# Patient Record
Sex: Female | Born: 1935 | Race: Black or African American | Hispanic: No | Smoking: Former smoker
Health system: Southern US, Community
[De-identification: ages and names within clinical notes are randomized; demographics above are authoritative.]

## PROBLEM LIST (undated history)

## (undated) DIAGNOSIS — C189 Malignant neoplasm of colon, unspecified: Secondary | ICD-10-CM

## (undated) DIAGNOSIS — E039 Hypothyroidism, unspecified: Secondary | ICD-10-CM

## (undated) DIAGNOSIS — I10 Essential (primary) hypertension: Secondary | ICD-10-CM

## (undated) DIAGNOSIS — E119 Type 2 diabetes mellitus without complications: Secondary | ICD-10-CM

## (undated) DIAGNOSIS — H269 Unspecified cataract: Secondary | ICD-10-CM

## (undated) DIAGNOSIS — K5790 Diverticulosis of intestine, part unspecified, without perforation or abscess without bleeding: Secondary | ICD-10-CM

## (undated) DIAGNOSIS — M1990 Unspecified osteoarthritis, unspecified site: Secondary | ICD-10-CM

## (undated) DIAGNOSIS — D649 Anemia, unspecified: Secondary | ICD-10-CM

## (undated) DIAGNOSIS — G43909 Migraine, unspecified, not intractable, without status migrainosus: Secondary | ICD-10-CM

## (undated) DIAGNOSIS — E785 Hyperlipidemia, unspecified: Secondary | ICD-10-CM

## (undated) DIAGNOSIS — K219 Gastro-esophageal reflux disease without esophagitis: Secondary | ICD-10-CM

## (undated) HISTORY — DX: Hyperlipidemia, unspecified: E78.5

## (undated) HISTORY — PX: DILATION AND CURETTAGE OF UTERUS: SHX78

## (undated) HISTORY — PX: OTHER SURGICAL HISTORY: SHX169

## (undated) HISTORY — DX: Malignant neoplasm of colon, unspecified: C18.9

## (undated) HISTORY — PX: HEMORRHOID SURGERY: SHX153

## (undated) HISTORY — DX: Unspecified osteoarthritis, unspecified site: M19.90

## (undated) HISTORY — DX: Hypothyroidism, unspecified: E03.9

## (undated) HISTORY — DX: Migraine, unspecified, not intractable, without status migrainosus: G43.909

## (undated) HISTORY — DX: Diverticulosis of intestine, part unspecified, without perforation or abscess without bleeding: K57.90

## (undated) HISTORY — DX: Essential (primary) hypertension: I10

## (undated) HISTORY — DX: Unspecified cataract: H26.9

---

## 1992-11-24 DIAGNOSIS — C189 Malignant neoplasm of colon, unspecified: Secondary | ICD-10-CM

## 1992-11-24 HISTORY — PX: COLON SURGERY: SHX602

## 1992-11-24 HISTORY — DX: Malignant neoplasm of colon, unspecified: C18.9

## 2001-07-01 ENCOUNTER — Other Ambulatory Visit: Admission: RE | Admit: 2001-07-01 | Discharge: 2001-07-01 | Payer: Self-pay | Admitting: Family Medicine

## 2001-08-04 ENCOUNTER — Ambulatory Visit (HOSPITAL_COMMUNITY): Admission: RE | Admit: 2001-08-04 | Discharge: 2001-08-05 | Payer: Self-pay | Admitting: Family Medicine

## 2001-08-05 ENCOUNTER — Encounter: Payer: Self-pay | Admitting: Family Medicine

## 2001-08-12 ENCOUNTER — Encounter: Payer: Self-pay | Admitting: Family Medicine

## 2001-08-12 ENCOUNTER — Ambulatory Visit (HOSPITAL_COMMUNITY): Admission: RE | Admit: 2001-08-12 | Discharge: 2001-08-12 | Payer: Self-pay | Admitting: Family Medicine

## 2001-09-19 ENCOUNTER — Emergency Department (HOSPITAL_COMMUNITY): Admission: EM | Admit: 2001-09-19 | Discharge: 2001-09-19 | Payer: Self-pay | Admitting: Emergency Medicine

## 2001-12-08 ENCOUNTER — Ambulatory Visit (HOSPITAL_COMMUNITY): Admission: RE | Admit: 2001-12-08 | Discharge: 2001-12-08 | Payer: Self-pay | Admitting: Family Medicine

## 2001-12-08 ENCOUNTER — Encounter: Payer: Self-pay | Admitting: Family Medicine

## 2002-07-12 ENCOUNTER — Encounter: Payer: Self-pay | Admitting: Family Medicine

## 2002-07-12 ENCOUNTER — Ambulatory Visit (HOSPITAL_COMMUNITY): Admission: RE | Admit: 2002-07-12 | Discharge: 2002-07-12 | Payer: Self-pay | Admitting: Family Medicine

## 2002-08-09 ENCOUNTER — Encounter: Admission: RE | Admit: 2002-08-09 | Discharge: 2002-11-07 | Payer: Self-pay | Admitting: Family Medicine

## 2002-12-08 ENCOUNTER — Encounter: Payer: Self-pay | Admitting: Family Medicine

## 2002-12-08 ENCOUNTER — Ambulatory Visit (HOSPITAL_COMMUNITY): Admission: RE | Admit: 2002-12-08 | Discharge: 2002-12-08 | Payer: Self-pay | Admitting: Family Medicine

## 2003-12-15 ENCOUNTER — Ambulatory Visit (HOSPITAL_COMMUNITY): Admission: RE | Admit: 2003-12-15 | Discharge: 2003-12-15 | Payer: Self-pay | Admitting: Internal Medicine

## 2004-02-28 ENCOUNTER — Ambulatory Visit (HOSPITAL_COMMUNITY): Admission: RE | Admit: 2004-02-28 | Discharge: 2004-02-28 | Payer: Self-pay | Admitting: Internal Medicine

## 2004-04-30 ENCOUNTER — Emergency Department (HOSPITAL_COMMUNITY): Admission: EM | Admit: 2004-04-30 | Discharge: 2004-04-30 | Payer: Self-pay | Admitting: Emergency Medicine

## 2004-12-16 ENCOUNTER — Ambulatory Visit (HOSPITAL_COMMUNITY): Admission: RE | Admit: 2004-12-16 | Discharge: 2004-12-16 | Payer: Self-pay | Admitting: Internal Medicine

## 2005-11-25 ENCOUNTER — Emergency Department (HOSPITAL_COMMUNITY): Admission: EM | Admit: 2005-11-25 | Discharge: 2005-11-25 | Payer: Self-pay | Admitting: Emergency Medicine

## 2005-12-18 ENCOUNTER — Ambulatory Visit (HOSPITAL_COMMUNITY): Admission: RE | Admit: 2005-12-18 | Discharge: 2005-12-18 | Payer: Self-pay | Admitting: Family Medicine

## 2006-12-21 ENCOUNTER — Ambulatory Visit (HOSPITAL_COMMUNITY): Admission: RE | Admit: 2006-12-21 | Discharge: 2006-12-21 | Payer: Self-pay | Admitting: Family Medicine

## 2007-01-06 ENCOUNTER — Ambulatory Visit (HOSPITAL_COMMUNITY): Admission: RE | Admit: 2007-01-06 | Discharge: 2007-01-06 | Payer: Self-pay | Admitting: Orthopaedic Surgery

## 2007-03-08 ENCOUNTER — Ambulatory Visit (HOSPITAL_COMMUNITY): Admission: RE | Admit: 2007-03-08 | Discharge: 2007-03-08 | Payer: Self-pay | Admitting: Orthopaedic Surgery

## 2007-12-24 ENCOUNTER — Ambulatory Visit (HOSPITAL_COMMUNITY): Admission: RE | Admit: 2007-12-24 | Discharge: 2007-12-24 | Payer: Self-pay | Admitting: Family Medicine

## 2008-02-17 ENCOUNTER — Ambulatory Visit (HOSPITAL_COMMUNITY): Admission: RE | Admit: 2008-02-17 | Discharge: 2008-02-17 | Payer: Self-pay | Admitting: Orthopaedic Surgery

## 2008-09-18 ENCOUNTER — Emergency Department (HOSPITAL_COMMUNITY): Admission: EM | Admit: 2008-09-18 | Discharge: 2008-09-18 | Payer: Self-pay | Admitting: Emergency Medicine

## 2008-12-11 ENCOUNTER — Ambulatory Visit (HOSPITAL_COMMUNITY): Admission: RE | Admit: 2008-12-11 | Discharge: 2008-12-11 | Payer: Self-pay | Admitting: Family Medicine

## 2008-12-11 ENCOUNTER — Encounter: Payer: Self-pay | Admitting: Orthopedic Surgery

## 2009-01-01 ENCOUNTER — Ambulatory Visit (HOSPITAL_COMMUNITY): Admission: RE | Admit: 2009-01-01 | Discharge: 2009-01-01 | Payer: Self-pay | Admitting: Family Medicine

## 2009-03-01 ENCOUNTER — Encounter (INDEPENDENT_AMBULATORY_CARE_PROVIDER_SITE_OTHER): Payer: Self-pay | Admitting: *Deleted

## 2009-03-01 ENCOUNTER — Ambulatory Visit: Payer: Self-pay | Admitting: Orthopedic Surgery

## 2009-03-01 DIAGNOSIS — IMO0002 Reserved for concepts with insufficient information to code with codable children: Secondary | ICD-10-CM | POA: Insufficient documentation

## 2009-03-01 DIAGNOSIS — M1710 Unilateral primary osteoarthritis, unspecified knee: Secondary | ICD-10-CM

## 2009-03-01 DIAGNOSIS — M25569 Pain in unspecified knee: Secondary | ICD-10-CM | POA: Insufficient documentation

## 2009-03-07 ENCOUNTER — Encounter: Payer: Self-pay | Admitting: Orthopedic Surgery

## 2009-04-26 DIAGNOSIS — Z862 Personal history of diseases of the blood and blood-forming organs and certain disorders involving the immune mechanism: Secondary | ICD-10-CM | POA: Insufficient documentation

## 2009-04-26 DIAGNOSIS — M1990 Unspecified osteoarthritis, unspecified site: Secondary | ICD-10-CM | POA: Insufficient documentation

## 2009-04-26 DIAGNOSIS — G43909 Migraine, unspecified, not intractable, without status migrainosus: Secondary | ICD-10-CM | POA: Insufficient documentation

## 2009-04-26 DIAGNOSIS — I10 Essential (primary) hypertension: Secondary | ICD-10-CM | POA: Insufficient documentation

## 2009-04-26 DIAGNOSIS — C189 Malignant neoplasm of colon, unspecified: Secondary | ICD-10-CM | POA: Insufficient documentation

## 2009-04-27 ENCOUNTER — Ambulatory Visit: Payer: Self-pay | Admitting: Internal Medicine

## 2009-05-04 ENCOUNTER — Ambulatory Visit: Payer: Self-pay | Admitting: Internal Medicine

## 2009-05-04 ENCOUNTER — Encounter: Payer: Self-pay | Admitting: Internal Medicine

## 2009-05-04 ENCOUNTER — Ambulatory Visit (HOSPITAL_COMMUNITY): Admission: RE | Admit: 2009-05-04 | Discharge: 2009-05-04 | Payer: Self-pay | Admitting: Internal Medicine

## 2009-05-04 HISTORY — PX: COLONOSCOPY W/ BIOPSIES: SHX1374

## 2009-05-13 ENCOUNTER — Encounter: Payer: Self-pay | Admitting: Internal Medicine

## 2009-09-13 ENCOUNTER — Ambulatory Visit: Payer: Self-pay | Admitting: Orthopedic Surgery

## 2010-01-03 ENCOUNTER — Ambulatory Visit (HOSPITAL_COMMUNITY): Admission: RE | Admit: 2010-01-03 | Discharge: 2010-01-03 | Payer: Self-pay | Admitting: Family Medicine

## 2010-10-20 ENCOUNTER — Inpatient Hospital Stay (HOSPITAL_COMMUNITY)
Admission: EM | Admit: 2010-10-20 | Discharge: 2010-10-22 | Payer: Self-pay | Source: Home / Self Care | Admitting: Internal Medicine

## 2010-12-13 ENCOUNTER — Other Ambulatory Visit (HOSPITAL_COMMUNITY): Payer: Self-pay | Admitting: Family Medicine

## 2010-12-13 DIAGNOSIS — Z1239 Encounter for other screening for malignant neoplasm of breast: Secondary | ICD-10-CM

## 2010-12-24 NOTE — Assessment & Plan Note (Signed)
Summary: HX OF HEMICOLECTOMY,CONSULT FOR COLONOSCOPY/AMS   Visit Type:  New patient Primary Care Provider:  Oval Linsey, MD  Chief Complaint:  needs 5 year tcs and no problems now.  History of Present Illness: 76 y/o AA female presents today for surveillance TCS.  She has h/o Stage BII colon cancer s/p R hemicolectomy in 1994.  Her last TCS was by Dr. Karilyn Cota in 4/05.  She had patent ileocolic anastomosis, single anal papilla.  Patient is doing well.  Denies GI symptoms other than occasional constipation for which she takes colace as needed.  She has rare brbpr if she passes a hard stool.  Current Medications (verified): 1)  Avalide 300-12.5 Mg Tabs (Irbesartan-Hydrochlorothiazide) .... Once Daily 2)  Lipitor 40 Mg Tabs (Atorvastatin Calcium) .... Once Daily 3)  Evista 60 Mg Tabs (Raloxifene Hcl) .... Once Daily 4)  Multivitamins  Tabs (Multiple Vitamin) .... Once Daily 5)  Omeprazole 20 Mg Cpdr (Omeprazole) .... Once Daily 6)  Sumatriptan Succinate 100 Mg Tabs (Sumatriptan Succinate) .... As Needed 7)  Naproxen 500 Mg Tabs (Naproxen) .... Once Daily 8)  Verapamil Hcl Cr 240 Mg Cr-Tabs (Verapamil Hcl) .... Once Daily 9)  Synthroid 50 Mcg Tabs (Levothyroxine Sodium) .... Once Daily  Allergies (verified): No Known Drug Allergies  Past History:  Past Medical History: migraines htn arthritis hypothyroidism osteoporosis hyperlipidemia  Past Surgical History: D and C Hemorrhoidectomy right knee arthroscopy  Right hemicolectomy  Family History: Father:deceased- 48 heart failure  Mother: deceased- 25 pneumonia, leukemia Siblings: 3 brothers, 5 sisters No FH of Colon Cancer:  Social History: Patient is divorced.  Children: 5 Quit tob 1994. Alcohol Use - no  Review of Systems General:  Denies fever, chills, and weight loss. Eyes:  Denies vision loss. ENT:  Denies nasal congestion, sore throat, hoarseness, and difficulty swallowing. CV:  Denies chest pains, angina,  palpitations, dyspnea on exertion, and peripheral edema. Resp:  Denies dyspnea at rest, dyspnea with exercise, and cough. GI:  Denies difficulty swallowing, nausea, indigestion/heartburn, vomiting, abdominal pain, diarrhea, constipation, and black BMs. GU:  Denies urinary burning and blood in urine. MS:  Complains of joint pain / LOM. Derm:  Denies rash. Neuro:  Denies memory loss and confusion. Psych:  Denies depression and anxiety. Endo:  Denies unusual weight change. Heme:  Denies bruising and bleeding. Allergy:  Denies hives.  Vital Signs:  Patient profile:   76 year old female Height:      65 inches Weight:      139 pounds BMI:     23.21 Temp:     98.0 degrees F oral Pulse rate:   76 / minute BP sitting:   128 / 80  (left arm) Cuff size:   regular  Vitals Entered By: Hendricks Limes LPN (April 28, 4539 11:18 AM)  Physical Exam  General:  Well developed, well nourished, no acute distress. Head:  Normocephalic and atraumatic. Eyes:  Conjunctivae pink, no scleral icterus.  Mouth:  Oropharyngeal mucosa moist, pink.  No lesions, erythema or exudate.    Neck:  Supple; no masses or thyromegaly. Lungs:  Clear throughout to auscultation. Heart:  Regular rate and rhythm; no murmurs, rubs,  or bruits. Abdomen:  Bowel sounds normal.  Abdomen is soft, nontender, nondistended.  No rebound or guarding.  No hepatosplenomegaly, masses or hernias.  No abdominal bruits.  Extremities:  No clubbing, cyanosis, edema or deformities noted. Neurologic:  Alert and  oriented x4;  grossly normal neurologically. Skin:  Intact without significant lesions or  rashes. Cervical Nodes:  No significant cervical adenopathy. Psych:  Alert and cooperative. Normal mood and affect.  Impression & Recommendations:  Problem # 1:  Hx of CARCINOMA, COLON (ICD-153.9)  Due for 5 year f/u surveillance TCS.  She has occasional constipation.  Recommend continue stool softners as needed.  Colonoscopy to be performed in  near future.  Risks, alternatives, and benefits including but not limited to the risk of reaction to medication, bleeding, infection, and perforation were addressed.  Patient voiced understanding and provided verbal consent.   Orders: New Patient Level III [phone])

## 2010-12-24 NOTE — Letter (Signed)
Summary: History form  History form   Imported By: Jacklynn Ganong 03/07/2009 11:19:15  _____________________________________________________________________  External Attachment:    Type:   Image     Comment:   External Document

## 2010-12-24 NOTE — Letter (Signed)
Summary: Patient Notice, Colon Biopsy Results  [facility]  [address]   [place], [place] [address]   Phone: [phone]  Fax: [phone]       May 13, 2009   [place] [address] [place], [place]  [address] 1935-01-01    Dear Ms. Myler,  I am pleased to inform you that the biopsies taken during your recent colonoscopy did not show any evidence of cancer upon pathologic examination, only mild inflammation..  You should have a repeat colonoscopy examination  in 5 years.  Please call us if you are having persistent problems or have questions about your condition that have not been fully answered at this time.  Sincerely,    R. Roetta Sessions MD  [facility] Ph: [phone]    Fax: [phone]   Appended Document: Patient Notice, Colon Biopsy Results letter mailed to pt  Appended Document: Patient Notice, Colon Biopsy Results Reminder noted in computer for 5 yrs.  AS

## 2010-12-24 NOTE — Letter (Signed)
Summary: TCS ORDER  TCS ORDER   Imported By: Ave Filter 04/27/2009 12:46:00  _____________________________________________________________________  External Attachment:    Type:   Image     Comment:   External Document

## 2010-12-24 NOTE — Assessment & Plan Note (Signed)
Summary: 6 M RE-CK/XRAY RT KNEE/EVERCARE,MEDICAID/CAF   Visit Type:  Follow-up Primary Provider:  Oval Linsey, MD   History of Present Illness: I saw Glenda Nolan in the office today for a followup visit.  She is a 76 years old woman with the complaint of:  BILATERAL KNEES.  DX:  OA knees  Treatment: She is status post arthroscopy RIGHT knee last year by Dr. Teola Bradley.    MEDS: Naproxen  Complaints:  NO PROBLEMS.  Today, scheduled for:  RECHECK AND XRAYS.  physical examination The patient is well developed and nourished, with normal grooming and hygiene. The body habitus is   The pulses and perfusion were normal with normal color, temperature  and no swelling  The coordination and sensation were normal   AAO X 3  Left knee 120 degrees flexion, 20 valgus.  Right knee 130 flexion, 15 valgus.  Current Medications (verified): 1)  Avalide 300-12.5 Mg Tabs (Irbesartan-Hydrochlorothiazide) .... Once Daily 2)  Lipitor 40 Mg Tabs (Atorvastatin Calcium) .... Once Daily 3)  Evista 60 Mg Tabs (Raloxifene Hcl) .... Once Daily 4)  Multivitamins  Tabs (Multiple Vitamin) .... Once Daily 5)  Omeprazole 20 Mg Cpdr (Omeprazole) .... Once Daily 6)  Sumatriptan Succinate 100 Mg Tabs (Sumatriptan Succinate) .... As Needed 7)  Naproxen 500 Mg Tabs (Naproxen) .... Once Daily 8)  Verapamil Hcl Cr 240 Mg Cr-Tabs (Verapamil Hcl) .... Once Daily 9)  Synthroid 50 Mcg Tabs (Levothyroxine Sodium) .... Once Daily  Allergies (verified): No Known Drug Allergies  Past History:  Past medical, surgical, family and social histories (including risk factors) reviewed, and no changes noted (except as noted below).  Past Medical History: Reviewed history from 04/27/2009 and no changes required. migraines htn arthritis hypothyroidism osteoporosis hyperlipidemia  Past Surgical History: Reviewed history from 04/27/2009 and no changes required. D and C Hemorrhoidectomy right knee  arthroscopy  Right hemicolectomy  Family History: Reviewed history from 04/27/2009 and no changes required. Father:deceased- 58 heart failure  Mother: deceased- 72 pneumonia, leukemia Siblings: 3 brothers, 5 sisters No FH of Colon Cancer:  Social History: Reviewed history from 04/27/2009 and no changes required. Patient is divorced.  Children: 5 Quit tob 1994. Alcohol Use - no  Review of Systems MS:  she does have some difficulty at times getting out of a chair. She is not having any significant pain in the knee.  Go to sleep.   Impression & Recommendations:  Problem # 1:  KNEE, ARTHRITIS, DEGEN./OSTEO (ICD-715.96) Assessment Unchanged  bilateral knee x-rays, AP, lateral, and sunrise.  There is valgus osteoarthritis in both knees, LEFT greater than RIGHT.  Impression moderate to severe bilateral valgus osteoarthritis.  Assessment she is doing well at this time with no major bone loss on x-ray. No major pain. Recommend followup six-month x-ray. If she has increased pain that is unbearable or intolerable and I would recommend knee replacement.   Her updated medication list for this problem includes:    Naproxen 500 Mg Tabs (Naproxen) ..... Once daily  Orders: Est. Patient Level III ([address]) Knee x-ray,  3 views (60454)

## 2010-12-24 NOTE — Letter (Signed)
Summary: Mediciation list  Mediciation list   Imported By: Jacklynn Ganong 03/07/2009 14:05:35  _____________________________________________________________________  External Attachment:    Type:   Image     Comment:   External Document

## 2010-12-24 NOTE — Letter (Signed)
Summary: Recall, Screening Colonoscopy Only  [facility]  [address]   [place], [place] [address]   Phone: [phone]  Fax: [phone]    March 01, 2009  [place] [address] [place], [place]  [address] 1935-11-24   Dear Ms. Fader,   Our records indicate it is time to schedule your colonoscopy.  However, our office has been unable to contact you by phone.   Please call our office at [phone] and ask for the nurse.   Thank you,    Hendricks Limes, LPN Cloria Spring, LPN  [facility] Ph: [phone]   Fax: [phone]

## 2010-12-24 NOTE — Assessment & Plan Note (Signed)
Summary: LEFT KNEE PAIN/XR AP JAN 2010/SEC HIR/MEDICAID/DONDIEGO/BSF   Vital Signs:  Patient profile:   76 year old female Weight:      139 pounds Pulse (ortho):   68 / minute Resp:     16 per minute  Vitals Entered By: Fuller Canada MD (March 01, 2009 10:00 AM)  History of Present Illness: I saw Glenda Nolan in the office today for an initial visit.  She is a 76 years old woman with the complaint of:  left knee pain, referral DR. DonDiego.  She is status post arthroscopy RIGHT knee last year by Dr. Teola Bradley.  She complains of LEFT knee pain and swelling for the last 2-3 months without any injury.  She has stiffness and problems getting out of a chair.  She denies catching or locking.  Her pain is diffuse.  She wears a knee sleeve and she take some naproxen 500 mg twice a day as well as topical BenGay.  She had an x-ray in January of 2010 on the 18th.  Pain and swelling for a few months, no injury.    Preventive Screening-Counseling & Management     Alcohol drinks/day: 0     Smoking Status: never     Caffeine use/day: 2  Allergies (verified): No Known Drug Allergies  Past History:  Family History:    FH of Cancer:     Family History of Diabetes    Family History Coronary Heart Disease female < 67    Family History of Arthritis    Hx, family, asthma    Hx, family, kidney disease NEC     (03/01/2009)  Social History:    Patient is divorced.     unemployed (03/01/2009)  Risk Factors:    Alcohol Use: 0 (03/01/2009)    >5 drinks/d w/in last 3 months: N/A    Caffeine Use: 2 (03/01/2009)    Diet: N/A    Exercise: N/A  Risk Factors:    Smoking Status: never (03/01/2009)    Packs/Day: N/A    Cigars/wk: N/A    Pipe Use/wk: N/A    Cans of tobacco/wk: N/A    Passive Smoke Exposure: N/A  Past Medical History:    migraines    htn    arthritis    thyroid    osteoporosis    constipation    cholesterol  Past Surgical History:    N and C  Hemorrhoidectomy    colon cancer    right knee arthroscopy  Family History:    FH of Cancer:     Family History of Diabetes    Family History Coronary Heart Disease female < 59    Family History of Arthritis    Hx, family, asthma    Hx, family, kidney disease NEC  Social History:    Patient is divorced.     unemployed    Alcohol drinks/day:  0    Caffeine use/day:  2    Smoking Status:  never  Review of Systems General:  Complains of weight loss, weight gain, fever, chills, and fatigue. Cardiac :  Complains of poor circulation; denies chest pain, angina, heart attack, heart failure, blood clots, and phlebitis. Resp:  Complains of cough; denies short of breath, difficulty breathing, COPD, and pneumonia. GI:  Complains of nausea, vomiting, diarrhea, constipation, and difficulty swallowing; denies ulcers, GERD, and reflux. GU:  Complains of testicular cancer; denies kidney failure, kidney transplant, kidney stones, burning, poor stream, blood in urine, and . Neuro:  Complains of headache, dizziness, migraines, numbness, weakness, and unsteady walking; denies tremor. MS:  Complains of rheumatoid arthritis, joint swelling, and osteoporosis; denies joint pain, gout, bone cancer, and . Endo:  Complains of thyroid disease; denies goiter and diabetes. Psych:  Complains of mood swings; denies depression, anxiety, panic attack, bipolar, and schizophrenia. Derm:  Complains of itching; denies eczema and cancer. EENT:  Complains of cataracts and ears ringing; denies poor vision, glaucoma, poor hearing, vertigo, sinusitis, hoarseness, toothaches, and bleeding gums. Immunology:  Complains of sinus problems and allergic to bee stings; denies seasonal allergies.  The review of systems is negative for Lymphatic.  Physical Exam  Msk:   The general apperance was normal.  Pulses:  pulses normal in all 4 extremities Extremities:  LEFT knee she is maintained in excellent flexion in her LEFT knee.  She  does have some joint line symptoms.  She has good muscle strength muscle tone and all ligaments are stable.  Alignment seems to be reasonable.   Neurologic:  no focal deficits, CN II-XII grossly intact with normal reflexes, coordination, muscle strength and tone Skin:  intact without lesions or rashes Inguinal Nodes:  no significant adenopathy Psych:  alert and cooperative; normal mood and affect; normal attention span and concentration   Impression & Recommendations:  Problem # 1:  KNEE PAIN (ICD-719.46)  Orders: New Patient Level III (47829)  Problem # 2:  KNEE, ARTHRITIS, DEGEN./OSTEO (FAO-130.86)  Assessment:  At this point she is stable we can follow her in 6 months for repeat x-ray of both knees I think she will come to have total knee replacement.  x-rays show arthritis of the knee.    Orders: New Patient Level III (57846)  Patient Instructions: 1)  f/u 6 months xrays of both knees

## 2011-01-06 ENCOUNTER — Ambulatory Visit (HOSPITAL_COMMUNITY)
Admission: RE | Admit: 2011-01-06 | Discharge: 2011-01-06 | Disposition: A | Discharge status: Home / Self Care | Payer: PRIVATE HEALTH INSURANCE | Source: Ambulatory Visit | Attending: Family Medicine | Admitting: Family Medicine

## 2011-01-06 DIAGNOSIS — Z1239 Encounter for other screening for malignant neoplasm of breast: Secondary | ICD-10-CM

## 2011-01-06 DIAGNOSIS — Z1231 Encounter for screening mammogram for malignant neoplasm of breast: Secondary | ICD-10-CM | POA: Insufficient documentation

## 2011-02-04 LAB — BASIC METABOLIC PANEL
BUN: 7 mg/dL (ref 6–23)
CO2: 24 mEq/L (ref 19–32)
Calcium: 9.4 mg/dL (ref 8.4–10.5)
Chloride: 96 mEq/L (ref 96–112)
Creatinine, Ser: 1.08 mg/dL (ref 0.4–1.2)
GFR calc Af Amer: >60 mL/min (ref >60)
GFR calc non Af Amer: 50 mL/min — ABNORMAL LOW (ref >60)
Glucose, Bld: 130 mg/dL — ABNORMAL HIGH (ref 70–99)
Potassium: 3.2 mEq/L — ABNORMAL LOW (ref 3.5–5.1)
Sodium: 133 mEq/L — ABNORMAL LOW (ref 135–145)

## 2011-02-04 LAB — DIFFERENTIAL
Basophils Absolute: 0 10*3/uL (ref 0.0–0.1)
Basophils Relative: 0 % (ref 0–1)
Eosinophils Absolute: 0.1 10*3/uL (ref 0.0–0.7)
Eosinophils Relative: 1 % (ref 0–5)
Lymphocytes Relative: 13 % (ref 12–46)
Lymphs Abs: 1.6 10*3/uL (ref 0.7–4.0)
Monocytes Absolute: 0.8 10*3/uL (ref 0.1–1.0)
Monocytes Relative: 6 % (ref 3–12)
Neutro Abs: 10.3 10*3/uL — ABNORMAL HIGH (ref 1.7–7.7)
Neutrophils Relative %: 80 % — ABNORMAL HIGH (ref 43–77)

## 2011-02-04 LAB — CBC
HCT: 40.4 % (ref 36.0–46.0)
Hemoglobin: 14.4 g/dL (ref 12.0–15.0)
MCH: 30.3 pg (ref 26.0–34.0)
MCHC: 35.6 g/dL (ref 30.0–36.0)
MCV: 85.1 fL (ref 78.0–100.0)
Platelets: 293 10*3/uL (ref 150–400)
RBC: 4.75 MIL/uL (ref 3.87–5.11)
RDW: 13.1 % (ref 11.5–15.5)
WBC: 12.8 10*3/uL — ABNORMAL HIGH (ref 4.0–10.5)

## 2011-02-04 LAB — URINALYSIS, ROUTINE W REFLEX MICROSCOPIC
Bilirubin Urine: NEGATIVE
Glucose, UA: NEGATIVE mg/dL
Hgb urine dipstick: NEGATIVE
Ketones, ur: NEGATIVE mg/dL
Nitrite: NEGATIVE
Protein, ur: NEGATIVE mg/dL
Specific Gravity, Urine: <1.005 — ABNORMAL LOW (ref 1.005–1.030)
Urobilinogen, UA: 0.2 mg/dL (ref 0.0–1.0)
pH: 6.5 (ref 5.0–8.0)

## 2011-02-04 LAB — POCT CARDIAC MARKERS
CKMB, poc: 1.1 ng/mL (ref 1.0–8.0)
CKMB, poc: 1.2 ng/mL (ref 1.0–8.0)
Myoglobin, poc: 137 ng/mL (ref 12–200)
Myoglobin, poc: 99.9 ng/mL (ref 12–200)
Troponin i, poc: <0.05 ng/mL (ref 0.00–0.09)
Troponin i, poc: <0.05 ng/mL (ref 0.00–0.09)

## 2011-02-04 LAB — CARDIAC PANEL(CRET KIN+CKTOT+MB+TROPI)
CK, MB: 1 ng/mL (ref 0.3–4.0)
CK, MB: 1.3 ng/mL (ref 0.3–4.0)
CK, MB: 2 ng/mL (ref 0.3–4.0)
Relative Index: INVALID (ref 0.0–2.5)
Relative Index: INVALID (ref 0.0–2.5)
Relative Index: INVALID (ref 0.0–2.5)
Total CK: 66 U/L (ref 7–177)
Total CK: 75 U/L (ref 7–177)
Total CK: 77 U/L (ref 7–177)
Troponin I: 0.01 ng/mL (ref 0.00–0.06)
Troponin I: 0.01 ng/mL (ref 0.00–0.06)
Troponin I: 0.01 ng/mL (ref 0.00–0.06)

## 2011-02-04 LAB — URINE CULTURE
Colony Count: NO GROWTH
Culture  Setup Time: 201111272046
Culture: NO GROWTH

## 2011-04-08 NOTE — H&P (Signed)
NAME:  Glenda Nolan, Glenda Nolan                ACCOUNT NO.:  [id number]   MEDICAL RECORD NO.:  [id number]          PATIENT TYPE:  AMB   LOCATION:  DAY                           FACILITY:  [facility]   PHYSICIAN:  J. Darreld Mclean, M.D. DATE OF BIRTH:  1935-08-13   DATE OF ADMISSION:  DATE OF DISCHARGE:  LH                              HISTORY & PHYSICAL   CHIEF COMPLAINT:  Torn cartilage.   The patient was seen by me this past week for the first time in over a  year.  She was 76 years old, now complaining of pain and tenderness in  her right knee.  Last year I saw her and she had a tear of the right  knee medial meniscus.  MRI showed a new tear of the medial meniscus and  marked degenerative changes.  I did right knee arthroscopy on her in  1998.  She has had pain and tenderness in the knee and I recommended  surgery, but she developed medical problems and surgery has been  delayed.   She has now seen Dr. Janna Arch and was found to be candidate for the  surgical procedure.  The patient has hypertension, cancer, and  depression.  She takes Tylenol, Imitrex 100 mg daily.  She has no  allergies.  Status post hemorrhoid surgery 1980, cancer of the breast  1984, D&C 1998, arthroscopy of the right knee by me 1998.  She says  hypertension runs in the family.  She lives in [place] and she is  single.  The patient denies heart disease, lung disease, kidney disease,  diabetes, TB, rheumatic fever, ulcer disease, or circulatory problems.  She does not smoke.  She does not drink.   BP is 140/80, pulse 80, respirations 16, afebrile, 5 feet 3 inches tall,  138 pounds.  Well-developed, alert, cooperative, oriented.  HEENT:  Negative.  NECK:  Supple.  LUNGS:  Clear to P&A.  HEART:  Regular without murmur heard.  ABDOMEN:  Soft without masses.  EXTREMITY:  Right knee painful and tender with positive Murray medially.  Drawer signs negative.  Lachman shows slight effusion knee and crepitus.  Other extremities  within normal limits.  CNS: Intact.  SKIN:  Intact.   IMPRESSION:  Tear medial meniscus right knee degenerative joint disease.   PLAN:  Arthroscopy of the knee.  Discussed with the patient planned  procedure with the risks and imponderables.  Appears to understand.  She  has undergone a procedure before.  Labs are pending.                                            ______________________________  J. Darreld Mclean, M.D.     JWK/MEDQ  D:  02/16/2008  T:  02/16/2008  Job:  [id number]

## 2011-04-08 NOTE — Op Note (Signed)
NAME:  Glenda Nolan, Glenda Nolan                ACCOUNT NO.:  [id number]   MEDICAL RECORD NO.:  [id number]          PATIENT TYPE:  AMB   LOCATION:  DAY                           FACILITY:  [facility]   PHYSICIAN:  J. Darreld Mclean, M.D. DATE OF BIRTH:  01-15-1935   DATE OF PROCEDURE:  02/17/2008  DATE OF DISCHARGE:                               OPERATIVE REPORT   PREOPERATIVE DIAGNOSES:  1. Tear of medial meniscus, right knee.  2. Degenerative joint disease.   POSTOPERATIVE DIAGNOSES:  1. Tear of medial meniscus, right knee.  2. Degenerative joint disease.  3. Tear of lateral meniscus, right knee.   PROCEDURES:  1. Operative arthroscopy of the right knee.  2. Partial medial and lateral meniscectomy.   ANESTHESIA:  Spinal.   SURGEON:  J. Darreld Mclean, M.D.   TOURNIQUET TIME:  23 minutes.   No drains.   INDICATIONS:  The patient is a 76 year old female with pain and  tenderness in her right knee with giving way.  MRI shows tear of the  medial meniscus.  She had an arthroscopy by me approximately 10 years  ago.  She did well with it.  She denies any new trauma.  She was  actually scheduled for surgery last year but developed medical problems.  Surgery was postponed.  She has continued having problems with the knee  and has not improved with conservative treatment.  She has degenerative  joint disease of the knee and may eventually need a total knee  arthroplasty.  I discussed the options.  She elected to have the  arthroscopy done at this time.  Risks and imponderables of the procedure  were discussed.  She appeared to understand and agreed to them.  She  asked appropriate questions.   DESCRIPTION OF PROCEDURE:  The patient was seen in the holding area.  The right knee was identified as the correct surgical site.  She placed  a mark on the right knee.  I placed a mark on the right knee.  She was  brought to the operating room and placed supine after spinal anesthesia  into the leg holder.   Tourniquet placed deflated, right upper thigh.  She was prepped and draped in the usual manner.  A time-out identifying  Ms. Bazzi as the patient and the right knee as the correct surgical  site.  The leg was elevated, wrapped circumferentially with an Esmarch  bandage.  Tourniquet inflated to 300 mmHg.  Esmarch bandage removed.  Inflow cannula inserted medially and lactated Ringer's instilled into  the knee by an infusion pump.  Arthroscope inserted laterally.  The knee  was systematically examined.   Suprapatellar pouch looked good.  Minimal synovitis.  Undersurface of  the patella had grade 2-3 changes, more medially.  The medial side of  the knee showed grade 3 changes of the femoral condyle and a small tear  of the posterior horn of the medial meniscus remnant.  There were some  changes grade 2-3 in the tibial plateau.  The ACL had a partial tear and  some fraying but was still intact and still  competent.  Laterally there  were significant grade 4 degenerative joint disease changes with  eburnation bone, both femoral and tibial, with a tear in the remaining  portion of the lateral meniscus.  There were no loose bodies.   Attention directed first to the lateral side since we visualized it so  well.  Using a meniscal shaver and meniscal punch, the remaining part of  the meniscus laterally, particularly the most lateral section, was  removed.  A good smooth contour was obtained.  Pertinent pictures were  taken throughout the procedure.  The ACL fraying was smoothed.  Medially  using a punch and a shaver, the posterior horn tear of the medial  meniscus was removed.  A good, smooth contour was obtained.  The knee  was systematically examined and no pathology found.  The wound was  irrigated with the remaining part of lactated Ringer's.  The wound was  reapproximated using 3-0 nylon in interrupted vertical mattress manner.  Sterile dressing applied, a bulky dressing applied, tourniquet  deflated  after 23 minutes.  The patient tolerated the procedure well, will go to  recovery in good condition.  A prescription of Vicodin ES given for  pain.  She is scheduled for physical therapy.  Any difficulties, she is  to contact the office or call me through the hospital beeper system.  I  will see her in the office in approximately 10 days to 2 weeks.           ______________________________  Shela Commons. Darreld Mclean, M.D.     JWK/MEDQ  D:  02/17/2008  T:  02/17/2008  Job:  [id number]

## 2011-04-08 NOTE — Op Note (Signed)
NAME:  Glenda Nolan, Glenda Nolan                ACCOUNT NO.:  [id number]   MEDICAL RECORD NO.:  [id number]          PATIENT TYPE:  AMB   LOCATION:  DAY                           FACILITY:  [facility]   PHYSICIAN:  R. Roetta Sessions, M.D. DATE OF BIRTH:  07-Apr-1935   DATE OF PROCEDURE:  05/04/2009  DATE OF DISCHARGE:                               OPERATIVE REPORT   PROCEDURE:  Colonoscopy with biopsy.   INDICATIONS FOR PROCEDURE:  A 76 year old lady with a history of colon  cancer, status post right hemicolectomy back in 1990.  Her last  surveillance exam was in 2005.  She has no lower GI tract symptoms.  She  is here for surveillance at this time.  The risks, benefits,  alternatives and limitations had been reviewed previously and again at  the bedside.  Please see the documentation in the medical record.   PROCEDURE NOTE:  O2 saturation, blood pressure, pulse and respirations  were monitored throughout the entire procedure.  Conscious sedation with  Versed 2 mg IV and Demerol 50 mg IV.   INSTRUMENTATION:  Pentax video chip system.   FINDINGS:  Digital rectal exam revealed no abnormalities.   ENDOSCOPIC FINDINGS:  The prep was good.  Colon:  Colonic mucosa was  surveyed from the rectosigmoid junction through the left transverse  colon to the area of the ileocecal valve/appendiceal orifice.  These  structures were well seen and photographed for the record.  There was a  fold that had a long linear erosion and appeared to have an ulceration  on it.  It was markedly friable.  This appeared to be a benign process.  It was biopsied.  The patient was noted to have some sigmoid  diverticula.  However, the remainder of the colonic mucosa appeared  normal.  The scope was pulled down in the rectum.  The rectal vault was  small, but I attempted to retroflex, but was unable to do so.  For the  same reason, I was able to see the rectal mucosa very well __________.  The patient had some anal papilla, otherwise  rectal mucosa appeared  unremarkable.  The patient tolerated the procedure well and was reacted  in endoscopy.   IMPRESSION:  1. Anal papilla, otherwise normal rectum.  2. Sigmoid diverticula, status post right hemicolectomy.  Linear      erosions on a fold of doubtful clinical significance at the      anastomosis, status post biopsy.   RECOMMENDATIONS:  Diverticulosis literature provided to [name].  We  will plan for her to come back in 5 years for a repeat screening  colonoscopy.  Further recommendations to follow pending path.      Jonathon Bellows, M.D.  Electronically Signed     RMR/MEDQ  D:  05/04/2009  T:  05/04/2009  Job:  [id number]   cc:   Melvyn Novas, MD  Fax: [phone]

## 2011-04-11 NOTE — Op Note (Signed)
NAME:  Derosa, Tyauna L                          ACCOUNT NO.:  [id number]   MEDICAL RECORD NO.:  [id number]                   PATIENT TYPE:  AMB   LOCATION:  DAY                                  FACILITY:  [facility]   PHYSICIAN:  Lionel December, M.D.                 DATE OF BIRTH:  05/30/1935   DATE OF PROCEDURE:  02/28/2004  DATE OF DISCHARGE:                                 OPERATIVE REPORT   PROCEDURE:  Total colonoscopy.   INDICATIONS FOR PROCEDURE:  Glenda Nolan is a 76 year old African American female  who is here for surveillance colonoscopy.  She is status post right  hemicolectomy in August 1994 and has remained in remission.  She presently  does not have any GI symptoms.  The procedure and risks were reviewed with  the patient, and informed consent was obtained.   PREOPERATIVE MEDICATIONS:  Demerol 25mg  IV, Versed 5mg  IV in divided dose.   FINDINGS:  The procedure was performed in the endoscopy suite.  The  patient's vital signs and O2 saturations were monitored during the procedure  and remained stable.  The patient was placed in the left lateral recumbent  position and rectal examination performed.  No abnormality noted on external  or digital exam.  The Olympus videoscope was placed into the rectum and  advanced into the region of the sigmoid colon.  The preparation was felt to  be excellent.  The scope was passed into the area of the hepatic flexure  where the ileocolic anastomosis was identified and was wide open.  Pictures  were taken for the record.  As the scope was withdrawn, the colonic mucosa  was carefully examined and was normal throughout.  The rectal mucosa  similarly was normal.  The scope was retroflexed to examine the anorectal  junction, and a single anal papilla was noted.  Once again, pictures were  taken for the record.  The endoscope was straightened and withdrawn.  The  patient tolerated the procedure well.   FINAL DIAGNOSES:  1. Single anal papilla.  Otherwise  normal colonoscopy.  2. Patent ileocolic anastomosis.   RECOMMENDATIONS:  1. She will resume her usual diet.  2. She should continue yearly Hemoccults and consider having a repeat exam     in four to five years from now.      ___________________________________________                                            Lionel December, M.D.   NR/MEDQ  D:  02/28/2004  T:  02/28/2004  Job:  [id number]   cc:   Hanley Hays. Dechurch, M.D.  [address]  [place]  [place] [address]  Fax: [phone]

## 2011-08-18 LAB — URINALYSIS, ROUTINE W REFLEX MICROSCOPIC
Bilirubin Urine: NEGATIVE
Glucose, UA: NEGATIVE
Hgb urine dipstick: NEGATIVE
Ketones, ur: NEGATIVE
Nitrite: NEGATIVE
Protein, ur: NEGATIVE
Specific Gravity, Urine: <1.005 — ABNORMAL LOW
Urobilinogen, UA: 0.2
pH: 6.5

## 2011-08-18 LAB — COMPREHENSIVE METABOLIC PANEL
ALT: 20
AST: 27
Albumin: 3.4 — ABNORMAL LOW
Alkaline Phosphatase: 73
BUN: 10
CO2: 29
Calcium: 9
Chloride: 101
Creatinine, Ser: 0.96
GFR calc Af Amer: >60
GFR calc non Af Amer: 57 — ABNORMAL LOW
Glucose, Bld: 167 — ABNORMAL HIGH
Potassium: 4
Sodium: 136
Total Bilirubin: 0.7
Total Protein: 6.1

## 2011-08-18 LAB — DIFFERENTIAL
Basophils Absolute: 0
Basophils Relative: 0
Eosinophils Absolute: 0.2
Eosinophils Relative: 2
Lymphocytes Relative: 24
Lymphs Abs: 1.7
Monocytes Absolute: 0.4
Monocytes Relative: 6
Neutro Abs: 4.8
Neutrophils Relative %: 68

## 2011-08-18 LAB — CBC
HCT: 36.4
Hemoglobin: 12.3
MCHC: 33.9
MCV: 87.4
Platelets: 226
RBC: 4.17
RDW: 14.4
WBC: 7.1

## 2011-08-25 LAB — POCT CARDIAC MARKERS
CKMB, poc: 1.9
CKMB, poc: 2.3
Myoglobin, poc: 124
Myoglobin, poc: 152
Troponin i, poc: <0.05
Troponin i, poc: <0.05

## 2011-08-25 LAB — DIFFERENTIAL
Basophils Absolute: 0
Basophils Relative: 0
Eosinophils Absolute: 0.2
Eosinophils Relative: 2
Lymphocytes Relative: 16
Lymphs Abs: 1.5
Monocytes Absolute: 0.7
Monocytes Relative: 7
Neutro Abs: 7
Neutrophils Relative %: 75

## 2011-08-25 LAB — COMPREHENSIVE METABOLIC PANEL
ALT: 13
AST: 23
Albumin: 3 — ABNORMAL LOW
Alkaline Phosphatase: 68
BUN: 9
CO2: 29
Calcium: 8.7
Chloride: 98
Creatinine, Ser: 0.87
GFR calc Af Amer: >60
GFR calc non Af Amer: >60
Glucose, Bld: 96
Potassium: 4.4
Sodium: 137
Total Bilirubin: 0.6
Total Protein: 6.8

## 2011-08-25 LAB — CBC
HCT: 34.5 — ABNORMAL LOW
Hemoglobin: 11.5 — ABNORMAL LOW
MCHC: 33.3
MCV: 83.8
Platelets: 330
RBC: 4.11
RDW: 15
WBC: 9.4

## 2011-08-25 LAB — URINALYSIS, ROUTINE W REFLEX MICROSCOPIC
Bilirubin Urine: NEGATIVE
Glucose, UA: NEGATIVE
Hgb urine dipstick: NEGATIVE
Ketones, ur: NEGATIVE
Nitrite: NEGATIVE
Protein, ur: NEGATIVE
Specific Gravity, Urine: 1.01
Urobilinogen, UA: 0.2
pH: 5.5

## 2011-08-25 LAB — LIPASE, BLOOD: Lipase: 15

## 2011-12-17 ENCOUNTER — Other Ambulatory Visit (HOSPITAL_COMMUNITY): Payer: Self-pay | Admitting: Internal Medicine

## 2011-12-17 DIAGNOSIS — Z139 Encounter for screening, unspecified: Secondary | ICD-10-CM

## 2012-01-09 ENCOUNTER — Ambulatory Visit (HOSPITAL_COMMUNITY): Payer: PRIVATE HEALTH INSURANCE

## 2012-01-13 LAB — CBC WITH DIFFERENTIAL/PLATELET
HCT: 41 %
Hemoglobin: 13.7 g/dL (ref 12.0–16.0)
WBC: 11
platelet count: 298

## 2012-01-16 ENCOUNTER — Ambulatory Visit (HOSPITAL_COMMUNITY): Payer: PRIVATE HEALTH INSURANCE

## 2012-01-19 ENCOUNTER — Ambulatory Visit (HOSPITAL_COMMUNITY)
Admission: RE | Admit: 2012-01-19 | Discharge: 2012-01-19 | Disposition: A | Discharge status: Home / Self Care | Payer: PRIVATE HEALTH INSURANCE | Source: Ambulatory Visit | Attending: Internal Medicine | Admitting: Internal Medicine

## 2012-01-19 DIAGNOSIS — Z1231 Encounter for screening mammogram for malignant neoplasm of breast: Secondary | ICD-10-CM | POA: Insufficient documentation

## 2012-01-19 DIAGNOSIS — Z139 Encounter for screening, unspecified: Secondary | ICD-10-CM

## 2012-03-02 LAB — BASIC METABOLIC PANEL WITH GFR
BUN: 8 mg/dL (ref 4–21)
CO2: 25 mmol/L
Calcium: 9.3 mg/dL
Chloride: 99 mmol/L
Creat: 0.81
Glucose: 96
Potassium: 5.1 mmol/L
Sodium: 138 mmol/L (ref 137–147)
TSH: 3.02 u[IU]/mL (ref 0.41–5.90)

## 2012-03-18 ENCOUNTER — Encounter: Payer: Self-pay | Admitting: Urgent Care

## 2012-03-19 ENCOUNTER — Encounter: Payer: Self-pay | Admitting: Urgent Care

## 2012-03-22 ENCOUNTER — Encounter: Payer: Self-pay | Admitting: Urgent Care

## 2012-03-22 ENCOUNTER — Ambulatory Visit (INDEPENDENT_AMBULATORY_CARE_PROVIDER_SITE_OTHER): Payer: PRIVATE HEALTH INSURANCE | Admitting: Urgent Care

## 2012-03-22 VITALS — BP 145/87 | HR 75 | Temp 97.3°F | Ht 63.0 in | Wt 137.4 lb

## 2012-03-22 DIAGNOSIS — K921 Melena: Secondary | ICD-10-CM | POA: Insufficient documentation

## 2012-03-22 DIAGNOSIS — R195 Other fecal abnormalities: Secondary | ICD-10-CM

## 2012-03-22 DIAGNOSIS — C189 Malignant neoplasm of colon, unspecified: Secondary | ICD-10-CM

## 2012-03-22 DIAGNOSIS — K219 Gastro-esophageal reflux disease without esophagitis: Secondary | ICD-10-CM

## 2012-03-22 DIAGNOSIS — Z862 Personal history of diseases of the blood and blood-forming organs and certain disorders involving the immune mechanism: Secondary | ICD-10-CM

## 2012-03-22 NOTE — Patient Instructions (Signed)
you need a colonoscopy and EGD (upper endoscopy) with Dr. Jena Gauss Do not take your iron for one week before your procedures Continue Prilosec 20 mg daily Avoid Naprosyn until your procedures

## 2012-03-22 NOTE — Assessment & Plan Note (Signed)
Rare indigestion on Prilosec 20 mg daily. EGD is planned for further evaluation given recent melena.

## 2012-03-22 NOTE — Progress Notes (Signed)
Referring Provider: Hall, Zack, MD Primary Care Physician:  HALL,ZACK, MD, MD Primary Gastroenterologist:  Dr. Rourk  Chief Complaint  Patient presents with  . Rectal Bleeding  . Melena    HPI:  Glenda Nolan is a 77 y.o. female here as a referral from Dr. Hall for melena and Hemoccult-positive stool.  She has history of stage IIB colon cancer status post right hemicolectomy in 1994.  C/o 6 month history of dark stools. She has been on iron for many years and also eats a lot of greens, so at first she didn't pay much attention to her dark stools.   She continued to notice dark tarry stools and mentioned it to [name], NP at Dr. Hall's office. She will was told she was Hemoccult-positive and she was referred here. She has been having occasional indigestion for which she took TUMS last night. She is also on Prilosec 20 mg daily.  Denies nausea, vomiting, dysphagia, odynophagia or anorexia.  He has noted small stools a little at a time, 3 times per day.  Weight stable.  Takes naprosyn for knee & shoulder pain.  Also takes a baby aspirin daily. Denies any significant abdominal pain. Past Medical History  Diagnosis Date  . Migraines   . HTN (hypertension)   . Arthritis   . Hypothyroidism   . Osteoporosis   . Hyperlipidemia   . Colon cancer 1994    Stage IIB  . Diverticulosis   . Cataracts, bilateral     pending surgery May 2013    Past Surgical History  Procedure Date  . Dilation and curettage of uterus   . Hemorrhoid surgery   . Knee arthrocopy(right)   . Colon surgery 1994    Right  . Colonoscopy w/ biopsies 05/04/09    Dr. Rourk-anal papilla, diverticulosis    Current Outpatient Prescriptions  Medication Sig Dispense Refill  . aspirin 81 MG chewable tablet Chew 81 mg by mouth daily.      . BENICAR 40 MG tablet Take 40 mg by mouth daily.       . ferrous gluconate (FERGON) 325 MG tablet Take 325 mg by mouth daily with breakfast.      . levothyroxine (SYNTHROID, LEVOTHROID)  50 MCG tablet Take 50 mcg by mouth daily.      . Multiple Vitamins-Minerals (PX SENIOR VITAMIN PO) Take 1 tablet by mouth.      . naproxen (NAPROSYN) 500 MG tablet Take 500 mg by mouth 2 (two) times daily with a meal.      . nebivolol (BYSTOLIC) 5 MG tablet Take 5 mg by mouth daily.      . omeprazole (PRILOSEC) 20 MG capsule Take 20 mg by mouth daily.      . raloxifene (EVISTA) 60 MG tablet Take 60 mg by mouth daily.      . rosuvastatin (CRESTOR) 10 MG tablet Take 10 mg by mouth daily.      . verapamil (CALAN-SR) 240 MG CR tablet Take 240 mg by mouth at bedtime.       . DISCONTD: VERAPAMIL HCL ER, CO, PO Take 240 mg by mouth 1 day or 1 dose.        Allergies as of 03/22/2012  . (No Known Allergies)    Family History:There is no known family history of colorectal carcinoma , liver disease, or inflammatory bowel disease.  Problem Relation Age of Onset  . Leukemia Mother 90  . Heart failure Father 58  . Coronary artery disease Sister       x2  . Stroke Son     History   Social History  . Marital Status: Divorced    Spouse Name: N/A    Number of Children: 5  . Years of Education: N/A   Occupational History  . retired,[employer]    Social History Main Topics  . Smoking status: Former Smoker -- 0.5 packs/day for 10 years    Types: Cigarettes    Quit date: 11/24/1992  . Smokeless tobacco: Former User    Quit date: 12/22/1992  . Alcohol Use: Yes     a glass of wine once a year  . Drug Use: No  . Sexually Active: Not on file   Other Topics Concern  . Not on file   Social History Narrative   2 children deceased-accident, CVA, 5 livingLives alone-[place] nearby  Review of Systems: Gen: Denies any fever, chills, sweats, anorexia, fatigue, weakness, malaise, weight loss, and sleep disorder CV: Denies chest pain, angina, palpitations, syncope, orthopnea, PND, peripheral edema, and claudication. Resp: Denies dyspnea at rest, dyspnea with exercise, cough, sputum, wheezing,  coughing up blood, and pleurisy. GI: Denies vomiting blood, jaundice, and fecal incontinence.   GU : Denies urinary burning, blood in urine, urinary frequency, urinary hesitancy, nocturnal urination, and urinary incontinence. MS: Denies joint pain, limitation of movement, and swelling, stiffness, low back pain, extremity pain. Denies muscle weakness, cramps, atrophy.  Derm: Denies rash, itching, dry skin, hives, moles, warts, or unhealing ulcers.  Psych: Denies depression, anxiety, memory loss, suicidal ideation, hallucinations, paranoia, and confusion. Heme: Denies bruising, bleeding, and enlarged lymph nodes. Neuro:  Denies any headaches, dizziness, paresthesias. Endo:  Denies any problems with DM or adrenal function.  Physical Exam: BP 145/87  Pulse 75  Temp(Src) 97.3 F (36.3 C) (Temporal)  Ht 5' 3" (1.6 m)  Wt 137 lb 6.4 oz (62.324 kg)  BMI 24.34 kg/m2 General:   Alert,  Well-developed, well-nourished, pleasant and cooperative in NAD. Accompanied by her daughter. Head:  Normocephalic and atraumatic. Eyes:  Sclera clear, no icterus.   Conjunctiva pink. Ears:  Normal auditory acuity. Nose:  No deformity, discharge, or lesions. Mouth:  No deformity or lesions,oropharynx pink & moist. Neck:  Supple; no masses or thyromegaly. Lungs:  Clear throughout to auscultation.   No wheezes, crackles, or rhonchi. No acute distress. Heart:  Regular rate and rhythm; no murmurs, clicks, rubs,  or gallops. Abdomen:  Normal bowel sounds.  No bruits.  Soft, non-tender and non-distended without masses, hepatosplenomegaly or hernias noted.  No guarding or rebound tenderness.   Rectal:  Deferred. Msk:  Symmetrical without gross deformities. Normal posture. Pulses:  Normal pulses noted. Extremities:  No clubbing or edema. Neurologic:  Alert and oriented x4;  grossly normal neurologically. Skin:  Intact without significant lesions or rashes. Lymph Nodes:  No significant cervical adenopathy. Psych:   Alert and cooperative. Normal mood and affect.  

## 2012-03-22 NOTE — Progress Notes (Signed)
Faxed to PCP

## 2012-03-22 NOTE — Assessment & Plan Note (Signed)
Glenda Nolan is a pleasant 77 y.o. female with history of colon cancer stage BII 1994, chronic iron deficiency anemia, with reported 6 month history of melena and hemoccult positive stool. She is going to need evaluation to look at her upper GI tract given melena to rule out peptic ulcer disease or gastritis, however given the fact that she has chronically dark stools secondary to iron it is difficult to attribute it to an upper GI source. Due to her hemoccult positive stool and history of colon cancer with last colonoscopy almost 3 years ago, she will have colonoscopy at the same time to look for colorectal carcinoma.  I have discussed risks & benefits which include, but are not limited to, bleeding, infection, perforation & drug reaction.  The patient agrees with this plan & written consent will be obtained.    Hold iron for one week before procedures Continue Prilosec 20 mg daily Avoid Naprosyn until your procedures

## 2012-03-22 NOTE — Assessment & Plan Note (Signed)
See heme positive stool.  

## 2012-03-30 ENCOUNTER — Encounter (HOSPITAL_COMMUNITY): Payer: PRIVATE HEALTH INSURANCE

## 2012-03-30 ENCOUNTER — Encounter (HOSPITAL_COMMUNITY): Payer: Self-pay

## 2012-03-31 MED ORDER — SODIUM CHLORIDE 0.45 % IV SOLN
Freq: Once | INTRAVENOUS | Status: AC
  Administered 2012-04-01: 1000 mL via INTRAVENOUS

## 2012-04-01 ENCOUNTER — Encounter (HOSPITAL_COMMUNITY): Payer: Self-pay | Admitting: *Deleted

## 2012-04-01 ENCOUNTER — Ambulatory Visit (HOSPITAL_COMMUNITY)
Admission: RE | Admit: 2012-04-01 | Discharge: 2012-04-01 | Disposition: A | Discharge status: Home Health | Payer: PRIVATE HEALTH INSURANCE | Source: Ambulatory Visit | Attending: Internal Medicine | Admitting: Internal Medicine

## 2012-04-01 ENCOUNTER — Encounter (HOSPITAL_COMMUNITY)
Admission: RE | Disposition: A | Discharge status: Home Health | Payer: Self-pay | Source: Ambulatory Visit | Attending: Internal Medicine

## 2012-04-01 DIAGNOSIS — K921 Melena: Secondary | ICD-10-CM

## 2012-04-01 DIAGNOSIS — R195 Other fecal abnormalities: Secondary | ICD-10-CM

## 2012-04-01 DIAGNOSIS — I10 Essential (primary) hypertension: Secondary | ICD-10-CM | POA: Insufficient documentation

## 2012-04-01 DIAGNOSIS — Z85038 Personal history of other malignant neoplasm of large intestine: Secondary | ICD-10-CM

## 2012-04-01 DIAGNOSIS — Z9049 Acquired absence of other specified parts of digestive tract: Secondary | ICD-10-CM | POA: Insufficient documentation

## 2012-04-01 DIAGNOSIS — D509 Iron deficiency anemia, unspecified: Secondary | ICD-10-CM | POA: Insufficient documentation

## 2012-04-01 DIAGNOSIS — K5730 Diverticulosis of large intestine without perforation or abscess without bleeding: Secondary | ICD-10-CM | POA: Insufficient documentation

## 2012-04-01 DIAGNOSIS — K633 Ulcer of intestine: Secondary | ICD-10-CM | POA: Insufficient documentation

## 2012-04-01 DIAGNOSIS — K648 Other hemorrhoids: Secondary | ICD-10-CM | POA: Insufficient documentation

## 2012-04-01 DIAGNOSIS — E785 Hyperlipidemia, unspecified: Secondary | ICD-10-CM | POA: Insufficient documentation

## 2012-04-01 HISTORY — PX: COLONOSCOPY: SHX174

## 2012-04-01 HISTORY — PX: ESOPHAGOGASTRODUODENOSCOPY: SHX1529

## 2012-04-01 LAB — HEMOGLOBIN AND HEMATOCRIT, BLOOD
HCT: 35.4 % — ABNORMAL LOW (ref 36.0–46.0)
Hemoglobin: 12.1 g/dL (ref 12.0–15.0)

## 2012-04-01 SURGERY — COLONOSCOPY WITH ESOPHAGOGASTRODUODENOSCOPY (EGD)
Anesthesia: Moderate Sedation

## 2012-04-01 MED ORDER — MEPERIDINE HCL 100 MG/ML IJ SOLN
INTRAMUSCULAR | Status: DC | PRN
  Administered 2012-04-01: 50 mg via INTRAVENOUS

## 2012-04-01 MED ORDER — MIDAZOLAM HCL 5 MG/5ML IJ SOLN
INTRAMUSCULAR | Status: DC | PRN
  Administered 2012-04-01: 2 mg via INTRAVENOUS
  Administered 2012-04-01 (×2): 1 mg via INTRAVENOUS

## 2012-04-01 MED ORDER — MEPERIDINE HCL 100 MG/ML IJ SOLN
INTRAMUSCULAR | Status: AC
  Filled 2012-04-01: qty 1

## 2012-04-01 MED ORDER — MIDAZOLAM HCL 5 MG/5ML IJ SOLN
INTRAMUSCULAR | Status: AC
  Filled 2012-04-01: qty 5

## 2012-04-01 MED ORDER — BUTAMBEN-TETRACAINE-BENZOCAINE 2-2-14 % EX AERO
INHALATION_SPRAY | CUTANEOUS | Status: DC | PRN
  Administered 2012-04-01: 2 via TOPICAL

## 2012-04-01 MED ORDER — STERILE WATER FOR IRRIGATION IR SOLN
Status: DC | PRN
  Administered 2012-04-01: 11:00:00

## 2012-04-01 NOTE — Interval H&P Note (Signed)
History and Physical Interval Note:  04/01/2012 10:47 AM  Glenda Nolan  has presented today for surgery, with the diagnosis of bkood in stool and melena  The various methods of treatment have been discussed with the patient and family. After consideration of risks, benefits and other options for treatment, the patient has consented to  Procedure(s) (LRB): COLONOSCOPY WITH ESOPHAGOGASTRODUODENOSCOPY (EGD) (N/A) as a surgical intervention .  The patients' history has been reviewed, patient examined, no change in status, stable for surgery.  I have reviewed the patients' chart and labs.  Questions were answered to the patient's satisfaction.     Eula Listen

## 2012-04-01 NOTE — Op Note (Signed)
NAME:  Glenda Nolan, Glenda Nolan                ACCOUNT NO.:  [id number]  MEDICAL RECORD NO.:  [id number]  LOCATION:  [facility]                          FACILITY:  [facility]  PHYSICIAN:  R. Roetta Sessions, MD FACP FACGDATE OF BIRTH:  02/13/1935  DATE OF PROCEDURE:  04/01/2012 DATE OF DISCHARGE:                              OPERATIVE REPORT   INDICATIONS FOR PROCEDURE:  Pleasant 77 year old lady with melena, history of iron-deficiency anemia, history of colon cancer.  EGD and colonoscopy now being done.  EGD procedure note is dictated now separately from the colonoscopy report.  Risks, benefits, limitations, alternatives, imponderables have been discussed, questions answered. Please see the documentation medical record.  PROCEDURE NOTE:  O2 saturation, blood pressure, pulse, respirations were monitored throughout the entire procedure.  CONSCIOUS SEDATION:  Versed 2 mg IV and Demerol 50 mg IV in divided doses.  Cetacaine spray for topical pharyngeal anesthesia.  INSTRUMENT:  Pentax video chip system.  FINDINGS:  Examination of tubular esophagus revealed normal-appearing esophageal mucosa.  EG junction was easily traversed.  Stomach:  Gastric cavity was emptied, insufflated well with air. Thorough exam of the gastric mucosa including retroflexed and proximal stomach esophagogastric junction demonstrated only a small hiatal hernia, deformity, and scarring of the prepyloric antral mucosa.  No ulcer infiltrating process was seen, however, the pyloric channel was friable.  Examination of bulb and second portion revealed no abnormalities.  Therapeutic/diagnostic maneuvers performed:  None.  The patient tolerated the procedure well, was prepared for colonoscopy.  IMPRESSION:  Normal esophagus, small hiatal hernia, deformity, scarring friability of the antrum/prepyloric mucosa suggestive of prior peptic ulcer disease, normal D1 and D2.  RECOMMENDATIONS: 1. Check H. pylori serologies.  Check H and H today  (H and H was     normal in March of this year.) 2. See colonoscopy report.     Jonathon Bellows, MD Caleen Essex     RMR/MEDQ  D:  04/01/2012  T:  04/01/2012  Job:  [id number]  cc:   Catalina Pizza, M.D. Fax: [phone]

## 2012-04-01 NOTE — Op Note (Signed)
NAME:  Glenda Nolan, Glenda Nolan                ACCOUNT NO.:  [id number]  MEDICAL RECORD NO.:  [id number]  LOCATION:  [facility]                          FACILITY:  [facility]  PHYSICIAN:  R. Roetta Sessions, MD FACP FACGDATE OF BIRTH:  December 17, 1934  DATE OF PROCEDURE: DATE OF DISCHARGE:                              OPERATIVE REPORT   INDICATIONS FOR PROCEDURE:  A 77 year old lady with iron-deficiency anemia, Hemoccult-positive stool, status post right hemicolectomy for colon cancer.  Please see EGD report just performed.  Colonoscopy is now being done.  Risks, benefits, limitations, alternatives, imponderables have been discussed, questions answered.  Please see the documentation of medical record.  Procedure note, O2 saturation, blood pressure, pulse, respirations monitored throughout the entire procedure.  CONSCIOUS SEDATION:  IV Versed, Demerol in incremental doses, Versed 4 mg IV, and Demerol 50 mg IV in divided doses.  INSTRUMENT:  Pentax video chip system.  FINDINGS:  Digital rectal exam revealed no abnormalities.  Endoscopic findings, prep was adequate.  Colon:  Colonic mucosa surveyed from the rectosigmoid junction, proximal to the area of the anastomosis with small intestine.  These structures were well seen and photographed for the record.  There were multiple ulcers on the colonic side of the anastomosis.  Please see photos.  These appear to be benign. They were biopsied from this level, scope was slowly and cautiously withdrawn. All previous mucosal surfaces were again seen.  The patient had left- sided diverticular changes.  The remainder of residual colonic mucosa; however, appeared normal.  The scope was pulled down the rectum where the rectal vault was found to be small.  For that reason, I was unable to retroflex, but was still able to see the rectal mucosa very well.  It was friable with minimal internal hemorrhoids.  The patient tolerated this procedure well, reactive  endoscopy.  IMPRESSION:  Colonic ulcers at the anastomosis likely NSAID related, although ischemia is not excluded, status post biopsy, left-sided diverticula.  Internal hemorrhoids and anal papilla.  RECOMMENDATIONS: 1. Stop Naprosyn and all nonsteroidal agents. 2. Followup on H and H and H pylori serology.  See EGD report. 3. Further recommendations to follow.     Jonathon Bellows, MD Caleen Essex     RMR/MEDQ  D:  04/01/2012  T:  04/01/2012  Job:  [id number]  cc:   Catalina Pizza, M.D. Fax: [phone]

## 2012-04-01 NOTE — Discharge Instructions (Signed)
EGD Discharge instructions Please read the instructions outlined below and refer to this sheet in the next few weeks. These discharge instructions provide you with general information on caring for yourself after you leave the hospital. Your doctor may also give you specific instructions. While your treatment has been planned according to the most current medical practices available, unavoidable complications occasionally occur. If you have any problems or questions after discharge, please call your doctor. ACTIVITY  You may resume your regular activity but move at a slower pace for the next 24 hours.   Take frequent rest periods for the next 24 hours.   Walking will help expel (get rid of) the air and reduce the bloated feeling in your abdomen.   No driving for 24 hours (because of the anesthesia (medicine) used during the test).   You may shower.   Do not sign any important legal documents or operate any machinery for 24 hours (because of the anesthesia used during the test).  NUTRITION  Drink plenty of fluids.   You may resume your normal diet.   Begin with a light meal and progress to your normal diet.   Avoid alcoholic beverages for 24 hours or as instructed by your caregiver.  MEDICATIONS  You may resume your normal medications unless your caregiver tells you otherwise.  WHAT YOU CAN EXPECT TODAY  You may experience abdominal discomfort such as a feeling of fullness or "gas" pains.  FOLLOW-UP  Your doctor will discuss the results of your test with you.  SEEK IMMEDIATE MEDICAL ATTENTION IF ANY OF THE FOLLOWING OCCUR:  Excessive nausea (feeling sick to your stomach) and/or vomiting.   Severe abdominal pain and distention (swelling).   Trouble swallowing.   Temperature over 101 F (37.8 C).   Rectal bleeding or vomiting of blood.    Colonoscopy Discharge Instructions  Read the instructions outlined below and refer to this sheet in the next few weeks. These  discharge instructions provide you with general information on caring for yourself after you leave the hospital. Your doctor may also give you specific instructions. While your treatment has been planned according to the most current medical practices available, unavoidable complications occasionally occur. If you have any problems or questions after discharge, call Dr. Gala Romney at [phone]. ACTIVITY  You may resume your regular activity, but move at a slower pace for the next 24 hours.   Take frequent rest periods for the next 24 hours.   Walking will help get rid of the air and reduce the bloated feeling in your belly (abdomen).   No driving for 24 hours (because of the medicine (anesthesia) used during the test).    Do not sign any important legal documents or operate any machinery for 24 hours (because of the anesthesia used during the test).  NUTRITION  Drink plenty of fluids.   You may resume your normal diet as instructed by your doctor.   Begin with a light meal and progress to your normal diet. Heavy or fried foods are harder to digest and may make you feel sick to your stomach (nauseated).   Avoid alcoholic beverages for 24 hours or as instructed.  MEDICATIONS  You may resume your normal medications unless your doctor tells you otherwise.  WHAT YOU CAN EXPECT TODAY  Some feelings of bloating in the abdomen.   Passage of more gas than usual.   Spotting of blood in your stool or on the toilet paper.  IF YOU HAD POLYPS REMOVED DURING THE  COLONOSCOPY:  No aspirin products for 7 days or as instructed.   No alcohol for 7 days or as instructed.   Eat a soft diet for the next 24 hours.  FINDING OUT THE RESULTS OF YOUR TEST Not all test results are available during your visit. If your test results are not back during the visit, make an appointment with your caregiver to find out the results. Do not assume everything is normal if you have not heard from your caregiver or the  medical facility. It is important for you to follow up on all of your test results.  SEEK IMMEDIATE MEDICAL ATTENTION IF:  You have more than a spotting of blood in your stool.   Your belly is swollen (abdominal distention).   You are nauseated or vomiting.   You have a temperature over 101.   You have abdominal pain or discomfort that is severe or gets worse throughout the day.     Stop naprosyn; Continue Omeprazole  H and H today; HP serologies  Further recommendations to follow

## 2012-04-01 NOTE — H&P (View-Only) (Signed)
Referring Provider: Dwana Melena, MD Primary Care Physician:  Dwana Melena, MD, MD Primary Gastroenterologist:  Dr. Jena Gauss  Chief Complaint  Patient presents with  . Rectal Bleeding  . Melena    HPI:  Glenda Nolan is a 77 y.o. female here as a referral from Dr. Margo Aye for melena and Hemoccult-positive stool.  She has history of stage IIB colon cancer status post right hemicolectomy in 1994.  C/o 6 month history of dark stools. She has been on iron for many years and also eats a lot of greens, so at first she didn't pay much attention to her dark stools.   She continued to notice dark tarry stools and mentioned it to Ms. Shon Baton, NP at Dr. Scharlene Gloss office. She will was told she was Hemoccult-positive and she was referred here. She has been having occasional indigestion for which she took TUMS last night. She is also on Prilosec 20 mg daily.  Denies nausea, vomiting, dysphagia, odynophagia or anorexia.  He has noted small stools a little at a time, 3 times per day.  Weight stable.  Takes naprosyn for knee & shoulder pain.  Also takes a baby aspirin daily. Denies any significant abdominal pain. Past Medical History  Diagnosis Date  . Migraines   . HTN (hypertension)   . Arthritis   . Hypothyroidism   . Osteoporosis   . Hyperlipidemia   . Colon cancer 1994    Stage IIB  . Diverticulosis   . Cataracts, bilateral     pending surgery May 2013    Past Surgical History  Procedure Date  . Dilation and curettage of uterus   . Hemorrhoid surgery   . Knee arthrocopy(right)   . Colon surgery 1994    Right  . Colonoscopy w/ biopsies 05/04/09    Dr. Clemon Chambers, diverticulosis    Current Outpatient Prescriptions  Medication Sig Dispense Refill  . aspirin 81 MG chewable tablet Chew 81 mg by mouth daily.      Marland Kitchen BENICAR 40 MG tablet Take 40 mg by mouth daily.       . ferrous gluconate (FERGON) 325 MG tablet Take 325 mg by mouth daily with breakfast.      . levothyroxine (SYNTHROID, LEVOTHROID)  50 MCG tablet Take 50 mcg by mouth daily.      . Multiple Vitamins-Minerals (PX SENIOR VITAMIN PO) Take 1 tablet by mouth.      . naproxen (NAPROSYN) 500 MG tablet Take 500 mg by mouth 2 (two) times daily with a meal.      . nebivolol (BYSTOLIC) 5 MG tablet Take 5 mg by mouth daily.      Marland Kitchen omeprazole (PRILOSEC) 20 MG capsule Take 20 mg by mouth daily.      . raloxifene (EVISTA) 60 MG tablet Take 60 mg by mouth daily.      . rosuvastatin (CRESTOR) 10 MG tablet Take 10 mg by mouth daily.      . verapamil (CALAN-SR) 240 MG CR tablet Take 240 mg by mouth at bedtime.       Marland Kitchen DISCONTD: VERAPAMIL HCL ER, CO, PO Take 240 mg by mouth 1 day or 1 dose.        Allergies as of 03/22/2012  . (No Known Allergies)    Family History:There is no known family history of colorectal carcinoma , liver disease, or inflammatory bowel disease.  Problem Relation Age of Onset  . Leukemia Mother 79  . Heart failure Father 67  . Coronary artery disease Sister  x2  . Stroke Son     History   Social History  . Marital Status: Divorced    Spouse Name: N/A    Number of Children: 5  . Years of Education: N/A   Occupational History  . retired,[employer]    Social History Main Topics  . Smoking status: Former Smoker -- 0.5 packs/day for 10 years    Types: Cigarettes    Quit date: 11/24/1992  . Smokeless tobacco: Former Neurosurgeon    Quit date: 12/22/1992  . Alcohol Use: Yes     a glass of wine once a year  . Drug Use: No  . Sexually Active: Not on file   Other Topics Concern  . Not on file   Social History Narrative   2 children deceased-accident, CVA, 5 livingLives alone-[place] nearby  Review of Systems: Gen: Denies any fever, chills, sweats, anorexia, fatigue, weakness, malaise, weight loss, and sleep disorder CV: Denies chest pain, angina, palpitations, syncope, orthopnea, PND, peripheral edema, and claudication. Resp: Denies dyspnea at rest, dyspnea with exercise, cough, sputum, wheezing,  coughing up blood, and pleurisy. GI: Denies vomiting blood, jaundice, and fecal incontinence.   GU : Denies urinary burning, blood in urine, urinary frequency, urinary hesitancy, nocturnal urination, and urinary incontinence. MS: Denies joint pain, limitation of movement, and swelling, stiffness, low back pain, extremity pain. Denies muscle weakness, cramps, atrophy.  Derm: Denies rash, itching, dry skin, hives, moles, warts, or unhealing ulcers.  Psych: Denies depression, anxiety, memory loss, suicidal ideation, hallucinations, paranoia, and confusion. Heme: Denies bruising, bleeding, and enlarged lymph nodes. Neuro:  Denies any headaches, dizziness, paresthesias. Endo:  Denies any problems with DM or adrenal function.  Physical Exam: BP 145/87  Pulse 75  Temp(Src) 97.3 F (36.3 C) (Temporal)  Ht 5\' 3"  (1.6 m)  Wt 137 lb 6.4 oz (62.324 kg)  BMI 24.34 kg/m2 General:   Alert,  Well-developed, well-nourished, pleasant and cooperative in NAD. Accompanied by her daughter. Head:  Normocephalic and atraumatic. Eyes:  Sclera clear, no icterus.   Conjunctiva pink. Ears:  Normal auditory acuity. Nose:  No deformity, discharge, or lesions. Mouth:  No deformity or lesions,oropharynx pink & moist. Neck:  Supple; no masses or thyromegaly. Lungs:  Clear throughout to auscultation.   No wheezes, crackles, or rhonchi. No acute distress. Heart:  Regular rate and rhythm; no murmurs, clicks, rubs,  or gallops. Abdomen:  Normal bowel sounds.  No bruits.  Soft, non-tender and non-distended without masses, hepatosplenomegaly or hernias noted.  No guarding or rebound tenderness.   Rectal:  Deferred. Msk:  Symmetrical without gross deformities. Normal posture. Pulses:  Normal pulses noted. Extremities:  No clubbing or edema. Neurologic:  Alert and oriented x4;  grossly normal neurologically. Skin:  Intact without significant lesions or rashes. Lymph Nodes:  No significant cervical adenopathy. Psych:   Alert and cooperative. Normal mood and affect.

## 2012-04-02 LAB — H. PYLORI ANTIBODY, IGG: H Pylori IgG: 0.57 {ISR}

## 2012-04-05 NOTE — Progress Notes (Signed)
Results Cc to PCP  

## 2012-04-06 ENCOUNTER — Ambulatory Visit (HOSPITAL_COMMUNITY)
Admission: RE | Admit: 2012-04-06 | Payer: PRIVATE HEALTH INSURANCE | Source: Ambulatory Visit | Admitting: Ophthalmology

## 2012-04-06 ENCOUNTER — Encounter (HOSPITAL_COMMUNITY): Admission: RE | Payer: Self-pay | Source: Ambulatory Visit

## 2012-04-06 SURGERY — PHACOEMULSIFICATION, CATARACT, WITH IOL INSERTION
Anesthesia: Monitor Anesthesia Care | Laterality: Right

## 2012-04-08 ENCOUNTER — Encounter: Payer: Self-pay | Admitting: Internal Medicine

## 2012-04-09 ENCOUNTER — Telehealth: Payer: Self-pay | Admitting: Gastroenterology

## 2012-04-09 MED ORDER — HYDROCORTISONE ACETATE 25 MG RE SUPP: 25.0000 mg | Freq: Two times a day (BID) | RECTAL | Status: AC

## 2012-04-09 NOTE — Telephone Encounter (Signed)
Pt's daughter Stanton Kidney called- pt is still having black stools - please call pt @ [phone]

## 2012-04-09 NOTE — Telephone Encounter (Signed)
She need an ov wn the next week w [facility]

## 2012-04-09 NOTE — Telephone Encounter (Signed)
Pt aware, she is taking naprosyn. Advised her to stop taking it. Pt uses [facility].

## 2012-04-09 NOTE — Telephone Encounter (Signed)
Spoke with pt- she stated the black stools are the same as they were before. She has a pain in her lower abd every once in awhile but not everyday. No N/V, no bright red blood seen in stool. She is not taking any pepto. She also denies chest pain, dizziness, weakness or difficulty breathing. She stated she feels good. Please advise.

## 2012-04-09 NOTE — Telephone Encounter (Signed)
Be sure she's not taking NSAIDs  Anusol suppositories as ordered If bleeding is worse over the weekend, she should present to emergency department Otherwise call and give [place] a progress report next week.

## 2012-04-12 ENCOUNTER — Other Ambulatory Visit: Payer: Self-pay

## 2012-04-12 DIAGNOSIS — D649 Anemia, unspecified: Secondary | ICD-10-CM

## 2012-04-12 NOTE — Telephone Encounter (Signed)
Called and made Pt an appointment on May 30 @1200  with KJ. I am going to mail her the lab work to do before her appointment.

## 2012-04-15 LAB — CBC WITH DIFFERENTIAL/PLATELET
Basophils Absolute: 0.1 10*3/uL (ref 0.0–0.1)
Basophils Relative: 1 % (ref 0–1)
Eosinophils Absolute: 0.2 10*3/uL (ref 0.0–0.7)
Eosinophils Relative: 2 % (ref 0–5)
HCT: 40.7 % (ref 36.0–46.0)
Hemoglobin: 13.6 g/dL (ref 12.0–15.0)
Lymphocytes Relative: 22 % (ref 12–46)
Lymphs Abs: 2 10*3/uL (ref 0.7–4.0)
MCH: 29.9 pg (ref 26.0–34.0)
MCHC: 33.4 g/dL (ref 30.0–36.0)
MCV: 89.5 fL (ref 78.0–100.0)
Monocytes Absolute: 0.8 10*3/uL (ref 0.1–1.0)
Monocytes Relative: 9 % (ref 3–12)
Neutro Abs: 6.2 10*3/uL (ref 1.7–7.7)
Neutrophils Relative %: 67 % (ref 43–77)
Platelets: 266 10*3/uL (ref 150–400)
RBC: 4.55 MIL/uL (ref 3.87–5.11)
RDW: 13.3 % (ref 11.5–15.5)
WBC: 9.2 10*3/uL (ref 4.0–10.5)

## 2012-04-21 ENCOUNTER — Encounter: Payer: Self-pay | Admitting: Internal Medicine

## 2012-04-22 ENCOUNTER — Encounter: Payer: Self-pay | Admitting: Urgent Care

## 2012-04-22 ENCOUNTER — Ambulatory Visit (INDEPENDENT_AMBULATORY_CARE_PROVIDER_SITE_OTHER): Payer: PRIVATE HEALTH INSURANCE | Admitting: Urgent Care

## 2012-04-22 VITALS — BP 128/79 | HR 73 | Temp 97.8°F | Ht 65.0 in | Wt 136.0 lb

## 2012-04-22 DIAGNOSIS — C189 Malignant neoplasm of colon, unspecified: Secondary | ICD-10-CM

## 2012-04-22 DIAGNOSIS — K219 Gastro-esophageal reflux disease without esophagitis: Secondary | ICD-10-CM

## 2012-04-22 DIAGNOSIS — Z862 Personal history of diseases of the blood and blood-forming organs and certain disorders involving the immune mechanism: Secondary | ICD-10-CM

## 2012-04-22 DIAGNOSIS — R195 Other fecal abnormalities: Secondary | ICD-10-CM

## 2012-04-22 NOTE — Progress Notes (Signed)
Primary Care Physician:  Dwana Melena, MD, MD Primary Gastroenterologist:  Dr. Jena Gauss  Chief Complaint  Patient presents with  . Follow-up    IDA, dark stools    HPI:  Glenda Nolan is a 77 y.o. female here for follow of dark stools and iron deficiency anemia.  She has history of colon cancer status post right hemicolectomy. She continues to complain of "Dark stools" since colonoscopy and EGD.  Recent EGD showed scarring from possible previous ulcer and colonoscopy she likes ulcers at the anastomosis likely secondary to NSAIDs. She was in inadvertently taking Naprosyn after her colonoscopy but this has since been discontinued. She is having BM QOD.   Denies any lower GI symptoms including constipation, diarrhea, rectal bleeding or weight loss.   Denies any upper GI symptoms including heartburn, indigestion, nausea, vomiting, dysphagia, odynophagia or anorexia. Overall she feels very well.  CBC Latest Ref Rng 04/12/2012 04/01/2012 01/13/2012 10/20/2010  Hb 12.0 - 15.0 g/dL 78.2 95.6 21.3 08.6  Hct 36.0 - 46.0 % 40.7 35.4 (L)  40.4  MCV 78.0 - 100.0 fL 89.5   85.1  Plts 150 - 400 K/uL 266   293    Past Medical History  Diagnosis Date  . Migraines   . HTN (hypertension)   . Arthritis   . Hypothyroidism   . Osteoporosis   . Hyperlipidemia   . Colon cancer 1994    Stage IIB  . Diverticulosis   . Cataracts, bilateral     pending surgery May 2013    Past Surgical History  Procedure Date  . Dilation and curettage of uterus   . Hemorrhoid surgery   . Knee arthrocopy(right)   . Colon surgery 1994    Right  . Colonoscopy w/ biopsies 05/04/09    Dr. Marlowe Alt papilla, diverticulosis  . Colonoscopy 04/01/2012    Rourk-Colonic ulcers at the anastomosis likely NSAID related, although ischemia is not excluded, status post biopsy, left-sided diverticula.  Internal hemorrhoids and anal papilla  . Esophagogastroduodenoscopy 04/01/2012    Rourk-Normal esophagus, small hiatal hernia, deformity,  scarring friability of the antrum/prepyloric mucosa suggestive of prior peptic ulcer disease, normal D1 and D2.    Current Outpatient Prescriptions  Medication Sig Dispense Refill  . aspirin 81 MG chewable tablet Chew 81 mg by mouth daily.      Marland Kitchen BENICAR 40 MG tablet Take 40 mg by mouth daily.       Marland Kitchen levothyroxine (SYNTHROID, LEVOTHROID) 50 MCG tablet Take 50 mcg by mouth daily.      . Multiple Vitamins-Minerals (PX SENIOR VITAMIN PO) Take 1 tablet by mouth daily.       . nebivolol (BYSTOLIC) 5 MG tablet Take 5 mg by mouth daily.      Marland Kitchen omeprazole (PRILOSEC) 20 MG capsule Take 20 mg by mouth daily.      . raloxifene (EVISTA) 60 MG tablet Take 60 mg by mouth daily.      . rosuvastatin (CRESTOR) 10 MG tablet Take 10 mg by mouth at bedtime.       . verapamil (CALAN-SR) 240 MG CR tablet Take 240 mg by mouth daily.         Allergies as of 04/22/2012  . (No Known Allergies)  Review of Systems: Gen: Denies any fever, chills, sweats, anorexia, fatigue, weakness, malaise, weight loss, and sleep disorder CV: Denies chest pain, angina, palpitations, syncope, orthopnea, PND, peripheral edema, and claudication. Resp: Denies dyspnea at rest, dyspnea with exercise, cough, sputum, wheezing, coughing up blood, and  pleurisy. GI: Denies vomiting blood, jaundice, and fecal incontinence.   GU : Denies urinary burning, blood in urine, urinary frequency, urinary hesitancy, nocturnal urination, and urinary incontinence. MS: Denies joint pain, limitation of movement, and swelling, stiffness, low back pain, extremity pain. Denies muscle weakness, cramps, atrophy.  Derm: Denies rash, itching, dry skin, hives, moles, warts, or unhealing ulcers.  Psych: Denies depression, anxiety, memory loss, suicidal ideation, hallucinations, paranoia, and confusion. Heme: Denies bruising or enlarged lymph nodes. Neuro:  Denies any headaches, dizziness, paresthesias. Endo:  Denies any problems with DM or adrenal  function.  Physical Exam: BP 128/79  Pulse 73  Temp(Src) 97.8 F (36.6 C) (Temporal)  Ht 5\' 5"  (1.651 m)  Wt 136 lb (61.689 kg)  BMI 22.63 kg/m2 General:   Alert,  Well-developed, well-nourished, pleasant and cooperative in NAD. Accompanied by her daughter. Eyes:  Sclera clear, no icterus.   Conjunctiva pink. Ears:  Normal auditory acuity. Nose:  No deformity, discharge, or lesions. Mouth:  No deformity or lesions,oropharynx pink & moist. Neck:  Supple; no masses or thyromegaly. Heart:  Regular rate and rhythm; no murmurs, clicks, rubs,  or gallops. Abdomen:  Normal bowel sounds.  No bruits.  Soft, non-tender and non-distended without masses, hepatosplenomegaly or hernias noted.  No guarding or rebound tenderness.   Rectal:  Deferred. Msk:  Symmetrical without gross deformities. Pulses:  Normal pulses noted. Extremities:  No clubbing or edema. Neurologic:  Alert and oriented x4;  grossly normal neurologically.

## 2012-04-22 NOTE — Patient Instructions (Signed)
Please return stool cards as soon possible

## 2012-04-23 ENCOUNTER — Encounter: Payer: Self-pay | Admitting: Urgent Care

## 2012-04-23 NOTE — Assessment & Plan Note (Signed)
On Prilosec 20 mg daily. Doing well.

## 2012-04-23 NOTE — Assessment & Plan Note (Addendum)
Recent colonoscopy and EGD show possible healing peptic ulcer disease and: Ulcers at anastomosis but to be due to NSAIDs. She has stopped her NSAIDs.  Continues to complain of dark stools.  Hemoglobin is normal. As long as hemoglobin remained stable, and no gross GI bleeding, no further workup at this point.  Hemoccult stools x3

## 2012-04-26 NOTE — Progress Notes (Signed)
Faxed to PCP

## 2012-04-28 ENCOUNTER — Ambulatory Visit: Payer: PRIVATE HEALTH INSURANCE | Admitting: Urgent Care

## 2012-04-28 DIAGNOSIS — R195 Other fecal abnormalities: Secondary | ICD-10-CM

## 2012-04-28 LAB — POC HEMOCCULT BLD/STL (HOME/3-CARD/SCREEN)
Card #2 Fecal Occult Blod, POC: NEGATIVE
Card #3 Fecal Occult Blood, POC: NEGATIVE
Fecal Occult Blood, POC: NEGATIVE

## 2012-04-28 NOTE — Progress Notes (Signed)
Quick Note:  Pt aware ______ 

## 2012-06-07 NOTE — Progress Notes (Signed)
REVIEWED.  

## 2013-01-14 ENCOUNTER — Other Ambulatory Visit (HOSPITAL_COMMUNITY): Payer: Self-pay | Admitting: Internal Medicine

## 2013-01-14 DIAGNOSIS — Z139 Encounter for screening, unspecified: Secondary | ICD-10-CM

## 2013-01-24 ENCOUNTER — Ambulatory Visit (HOSPITAL_COMMUNITY)
Admission: RE | Admit: 2013-01-24 | Discharge: 2013-01-24 | Disposition: A | Discharge status: Home / Self Care | Payer: PRIVATE HEALTH INSURANCE | Source: Ambulatory Visit | Attending: Internal Medicine | Admitting: Internal Medicine

## 2013-01-24 DIAGNOSIS — Z139 Encounter for screening, unspecified: Secondary | ICD-10-CM

## 2013-01-24 DIAGNOSIS — Z1231 Encounter for screening mammogram for malignant neoplasm of breast: Secondary | ICD-10-CM | POA: Insufficient documentation

## 2013-07-18 ENCOUNTER — Encounter (HOSPITAL_COMMUNITY): Payer: Self-pay | Admitting: Pharmacy Technician

## 2013-07-26 NOTE — Patient Instructions (Addendum)
Your procedure is scheduled on: 08/01/2013  Report to [facility] at  630   AM.  Call this number if you have problems the morning of surgery: [phone]   Do not eat food or drink liquids :After Midnight.      Take these medicines the morning of surgery with A SIP OF WATER: benicar, synthroid, bystolic, prilosec, verapamil   Do not wear jewelry, make-up or nail polish.  Do not wear lotions, powders, or perfumes.   Do not shave 48 hours prior to surgery.  Do not bring valuables to the hospital.  Contacts, dentures or bridgework may not be worn into surgery.  Leave suitcase in the car. After surgery it may be brought to your room.  For patients admitted to the hospital, checkout time is 11:00 AM the day of discharge.   Patients discharged the day of surgery will not be allowed to drive home.  :     Please read over the following fact sheets that you were given: Coughing and Deep Breathing, Surgical Site Infection Prevention, Anesthesia Post-op Instructions and Care and Recovery After Surgery    Cataract A cataract is a clouding of the lens of the eye. When a lens becomes cloudy, vision is reduced based on the degree and nature of the clouding. Many cataracts reduce vision to some degree. Some cataracts make people more near-sighted as they develop. Other cataracts increase glare. Cataracts that are ignored and become worse can sometimes look white. The white color can be seen through the pupil. CAUSES   Aging. However, cataracts may occur at any age, even in newborns.   Certain drugs.   Trauma to the eye.   Certain diseases such as diabetes.   Specific eye diseases such as chronic inflammation inside the eye or a sudden attack of a rare form of glaucoma.   Inherited or acquired medical problems.  SYMPTOMS   Gradual, progressive drop in vision in the affected eye.   Severe, rapid visual loss. This most often happens when trauma is the cause.  DIAGNOSIS  To detect a cataract, an  eye doctor examines the lens. Cataracts are best diagnosed with an exam of the eyes with the pupils enlarged (dilated) by drops.  TREATMENT  For an early cataract, vision may improve by using different eyeglasses or stronger lighting. If that does not help your vision, surgery is the only effective treatment. A cataract needs to be surgically removed when vision loss interferes with your everyday activities, such as driving, reading, or watching TV. A cataract may also have to be removed if it prevents examination or treatment of another eye problem. Surgery removes the cloudy lens and usually replaces it with a substitute lens (intraocular lens, IOL).  At a time when both you and your doctor agree, the cataract will be surgically removed. If you have cataracts in both eyes, only one is usually removed at a time. This allows the operated eye to heal and be out of danger from any possible problems after surgery (such as infection or poor wound healing). In rare cases, a cataract may be doing damage to your eye. In these cases, your caregiver may advise surgical removal right away. The vast majority of people who have cataract surgery have better vision afterward. HOME CARE INSTRUCTIONS  If you are not planning surgery, you may be asked to do the following:  Use different eyeglasses.   Use stronger or brighter lighting.   Ask your eye doctor about reducing your medicine  dose or changing medicines if it is thought that a medicine caused your cataract. Changing medicines does not make the cataract go away on its own.   Become familiar with your surroundings. Poor vision can lead to injury. Avoid bumping into things on the affected side. You are at a higher risk for tripping or falling.   Exercise extreme care when driving or operating machinery.   Wear sunglasses if you are sensitive to bright light or experiencing problems with glare.  SEEK IMMEDIATE MEDICAL CARE IF:   You have a worsening or sudden  vision loss.   You notice redness, swelling, or increasing pain in the eye.   You have a fever.  Document Released: 11/10/2005 Document Revised: 10/30/2011 Document Reviewed: 07/04/2011 [facility] Patient Information 2012 [place].PATIENT INSTRUCTIONS POST-ANESTHESIA  IMMEDIATELY FOLLOWING SURGERY:  Do not drive or operate machinery for the first twenty four hours after surgery.  Do not make any important decisions for twenty four hours after surgery or while taking narcotic pain medications or sedatives.  If you develop intractable nausea and vomiting or a severe headache please notify your doctor immediately.  FOLLOW-UP:  Please make an appointment with your surgeon as instructed. You do not need to follow up with anesthesia unless specifically instructed to do so.  WOUND CARE INSTRUCTIONS (if applicable):  Keep a dry clean dressing on the anesthesia/puncture wound site if there is drainage.  Once the wound has quit draining you may leave it open to air.  Generally you should leave the bandage intact for twenty four hours unless there is drainage.  If the epidural site drains for more than 36-48 hours please call the anesthesia department.  QUESTIONS?:  Please feel free to call your physician or the hospital operator if you have any questions, and they will be happy to assist you.

## 2013-07-27 ENCOUNTER — Encounter (HOSPITAL_COMMUNITY): Payer: Self-pay

## 2013-07-27 ENCOUNTER — Encounter (HOSPITAL_COMMUNITY)
Admission: RE | Admit: 2013-07-27 | Discharge: 2013-07-27 | Disposition: A | Discharge status: Home / Self Care | Payer: PRIVATE HEALTH INSURANCE | Source: Ambulatory Visit | Attending: Ophthalmology | Admitting: Ophthalmology

## 2013-07-27 DIAGNOSIS — Z01810 Encounter for preprocedural cardiovascular examination: Secondary | ICD-10-CM | POA: Insufficient documentation

## 2013-07-27 DIAGNOSIS — Z01812 Encounter for preprocedural laboratory examination: Secondary | ICD-10-CM | POA: Insufficient documentation

## 2013-07-27 HISTORY — DX: Gastro-esophageal reflux disease without esophagitis: K21.9

## 2013-07-27 LAB — HEMOGLOBIN AND HEMATOCRIT, BLOOD
HCT: 40.7 % (ref 36.0–46.0)
Hemoglobin: 13.9 g/dL (ref 12.0–15.0)

## 2013-07-27 LAB — BASIC METABOLIC PANEL
BUN: 9 mg/dL (ref 6–23)
CO2: 30 mEq/L (ref 19–32)
Calcium: 9.5 mg/dL (ref 8.4–10.5)
Chloride: 94 mEq/L — ABNORMAL LOW (ref 96–112)
Creatinine, Ser: 0.82 mg/dL (ref 0.50–1.10)
GFR calc Af Amer: 79 mL/min — ABNORMAL LOW (ref >90)
GFR calc non Af Amer: 68 mL/min — ABNORMAL LOW (ref >90)
Glucose, Bld: 95 mg/dL (ref 70–99)
Potassium: 4 mEq/L (ref 3.5–5.1)
Sodium: 135 mEq/L (ref 135–145)

## 2013-07-29 MED ORDER — CYCLOPENTOLATE-PHENYLEPHRINE OP SOLN OPTIME - NO CHARGE
OPHTHALMIC | Status: AC
  Filled 2013-07-29: qty 2

## 2013-07-29 MED ORDER — NEOMYCIN-POLYMYXIN-DEXAMETH 3.5-10000-0.1 OP OINT
TOPICAL_OINTMENT | OPHTHALMIC | Status: AC
  Filled 2013-07-29: qty 3.5

## 2013-07-29 MED ORDER — LIDOCAINE HCL 3.5 % OP GEL
OPHTHALMIC | Status: AC
  Filled 2013-07-29: qty 5

## 2013-07-29 MED ORDER — LIDOCAINE HCL (PF) 1 % IJ SOLN
INTRAMUSCULAR | Status: AC
  Filled 2013-07-29: qty 2

## 2013-07-29 MED ORDER — TETRACAINE HCL 0.5 % OP SOLN
OPHTHALMIC | Status: AC
  Filled 2013-07-29: qty 2

## 2013-08-01 ENCOUNTER — Encounter (HOSPITAL_COMMUNITY): Payer: Self-pay | Admitting: Anesthesiology

## 2013-08-01 ENCOUNTER — Encounter (HOSPITAL_COMMUNITY)
Admission: RE | Disposition: A | Discharge status: Home / Self Care | Payer: Self-pay | Source: Ambulatory Visit | Attending: Ophthalmology

## 2013-08-01 ENCOUNTER — Ambulatory Visit (HOSPITAL_COMMUNITY): Payer: PRIVATE HEALTH INSURANCE | Admitting: Anesthesiology

## 2013-08-01 ENCOUNTER — Encounter (HOSPITAL_COMMUNITY): Payer: Self-pay | Admitting: *Deleted

## 2013-08-01 ENCOUNTER — Ambulatory Visit (HOSPITAL_COMMUNITY)
Admission: RE | Admit: 2013-08-01 | Discharge: 2013-08-01 | Disposition: A | Discharge status: Home / Self Care | Payer: PRIVATE HEALTH INSURANCE | Source: Ambulatory Visit | Attending: Ophthalmology | Admitting: Ophthalmology

## 2013-08-01 DIAGNOSIS — I10 Essential (primary) hypertension: Secondary | ICD-10-CM | POA: Insufficient documentation

## 2013-08-01 DIAGNOSIS — H2589 Other age-related cataract: Secondary | ICD-10-CM | POA: Insufficient documentation

## 2013-08-01 HISTORY — PX: CATARACT EXTRACTION W/PHACO: SHX586

## 2013-08-01 SURGERY — PHACOEMULSIFICATION, CATARACT, WITH IOL INSERTION
Anesthesia: Monitor Anesthesia Care | Site: Eye | Laterality: Left | Wound class: Clean

## 2013-08-01 MED ORDER — PROVISC 10 MG/ML IO SOLN
INTRAOCULAR | Status: DC | PRN
  Administered 2013-08-01: 8.5 mg via INTRAOCULAR

## 2013-08-01 MED ORDER — EPINEPHRINE HCL 1 MG/ML IJ SOLN
INTRAMUSCULAR | Status: AC
  Filled 2013-08-01: qty 1

## 2013-08-01 MED ORDER — LACTATED RINGERS IV SOLN
INTRAVENOUS | Status: DC
  Administered 2013-08-01: 1000 mL via INTRAVENOUS

## 2013-08-01 MED ORDER — LIDOCAINE HCL 3.5 % OP GEL
1.0000 "application " | Freq: Once | OPHTHALMIC | Status: AC
  Administered 2013-08-01: 1 via OPHTHALMIC

## 2013-08-01 MED ORDER — PHENYLEPHRINE HCL 2.5 % OP SOLN
1.0000 [drp] | OPHTHALMIC | Status: AC
  Administered 2013-08-01 (×3): 1 [drp] via OPHTHALMIC

## 2013-08-01 MED ORDER — POVIDONE-IODINE 5 % OP SOLN
OPHTHALMIC | Status: DC | PRN
  Administered 2013-08-01: 1 via OPHTHALMIC

## 2013-08-01 MED ORDER — PHENYLEPHRINE HCL 2.5 % OP SOLN
OPHTHALMIC | Status: AC
  Filled 2013-08-01: qty 15

## 2013-08-01 MED ORDER — EPINEPHRINE HCL 1 MG/ML IJ SOLN
INTRAOCULAR | Status: DC | PRN
  Administered 2013-08-01: 09:00:00

## 2013-08-01 MED ORDER — MIDAZOLAM HCL 2 MG/2ML IJ SOLN
INTRAMUSCULAR | Status: AC
  Filled 2013-08-01: qty 2

## 2013-08-01 MED ORDER — CYCLOPENTOLATE-PHENYLEPHRINE 0.2-1 % OP SOLN
1.0000 [drp] | OPHTHALMIC | Status: AC
  Administered 2013-08-01 (×3): 1 [drp] via OPHTHALMIC

## 2013-08-01 MED ORDER — TETRACAINE HCL 0.5 % OP SOLN
1.0000 [drp] | OPHTHALMIC | Status: AC
  Administered 2013-08-01 (×3): 1 [drp] via OPHTHALMIC

## 2013-08-01 MED ORDER — BSS IO SOLN
INTRAOCULAR | Status: DC | PRN
  Administered 2013-08-01: 15 mL via INTRAOCULAR

## 2013-08-01 MED ORDER — LIDOCAINE HCL (PF) 1 % IJ SOLN
INTRAMUSCULAR | Status: DC | PRN
  Administered 2013-08-01: .4 mL

## 2013-08-01 MED ORDER — MIDAZOLAM HCL 2 MG/2ML IJ SOLN
1.0000 mg | INTRAMUSCULAR | Status: DC | PRN
  Administered 2013-08-01: 2 mg via INTRAVENOUS

## 2013-08-01 MED ORDER — NEOMYCIN-POLYMYXIN-DEXAMETH 0.1 % OP OINT
TOPICAL_OINTMENT | OPHTHALMIC | Status: DC | PRN
  Administered 2013-08-01: 1 via OPHTHALMIC

## 2013-08-01 SURGICAL SUPPLY — 32 items
CAPSULAR TENSION RING-AMO (OPHTHALMIC RELATED) IMPLANT
CLOTH BEACON ORANGE TIMEOUT ST (SAFETY) ×1 IMPLANT
EYE SHIELD UNIVERSAL CLEAR (GAUZE/BANDAGES/DRESSINGS) ×1 IMPLANT
GLOVE BIO SURGEON STRL SZ 6.5 (GLOVE) IMPLANT
GLOVE BIOGEL PI IND STRL 6.5 (GLOVE) IMPLANT
GLOVE BIOGEL PI IND STRL 7.0 (GLOVE) IMPLANT
GLOVE BIOGEL PI IND STRL 7.5 (GLOVE) IMPLANT
GLOVE BIOGEL PI INDICATOR 6.5 (GLOVE)
GLOVE BIOGEL PI INDICATOR 7.0 (GLOVE) ×2
GLOVE BIOGEL PI INDICATOR 7.5 (GLOVE)
GLOVE ECLIPSE 6.5 STRL STRAW (GLOVE) IMPLANT
GLOVE ECLIPSE 7.0 STRL STRAW (GLOVE) IMPLANT
GLOVE ECLIPSE 7.5 STRL STRAW (GLOVE) IMPLANT
GLOVE EXAM NITRILE LRG STRL (GLOVE) IMPLANT
GLOVE EXAM NITRILE MD LF STRL (GLOVE) IMPLANT
GLOVE SKINSENSE NS SZ6.5 (GLOVE)
GLOVE SKINSENSE NS SZ7.0 (GLOVE)
GLOVE SKINSENSE STRL SZ6.5 (GLOVE) IMPLANT
GLOVE SKINSENSE STRL SZ7.0 (GLOVE) IMPLANT
KIT VITRECTOMY (OPHTHALMIC RELATED) IMPLANT
PAD ARMBOARD 7.5X6 YLW CONV (MISCELLANEOUS) ×1 IMPLANT
PROC W NO LENS (INTRAOCULAR LENS)
PROC W SPEC LENS (INTRAOCULAR LENS)
PROCESS W NO LENS (INTRAOCULAR LENS) IMPLANT
PROCESS W SPEC LENS (INTRAOCULAR LENS) IMPLANT
RING MALYGIN (MISCELLANEOUS) IMPLANT
SIGHTPATH CAT PROC W REG LENS (Ophthalmic Related) ×2 IMPLANT
SYR TB 1ML LL NO SAFETY (SYRINGE) ×1 IMPLANT
TAPE SURG TRANSPARENT 2IN (GAUZE/BANDAGES/DRESSINGS) ×1 IMPLANT
TAPE TRANSPARENT 2IN (GAUZE/BANDAGES/DRESSINGS) ×1
VISCOELASTIC ADDITIONAL (OPHTHALMIC RELATED) IMPLANT
WATER STERILE IRR 250ML POUR (IV SOLUTION) ×1 IMPLANT

## 2013-08-01 NOTE — Transfer of Care (Signed)
Immediate Anesthesia Transfer of Care Note  Patient: Glenda Nolan  Procedure(s) Performed: Procedure(s) with comments: CATARACT EXTRACTION PHACO AND INTRAOCULAR LENS PLACEMENT (IOC) (Left) - CDE:11.47  Patient Location: PACU and Short Stay  Anesthesia Type:MAC  Level of Consciousness: awake  Airway & Oxygen Therapy: Patient Spontanous Breathing  Post-op Assessment: Report given to PACU RN  Post vital signs: Reviewed  Complications: No apparent anesthesia complications

## 2013-08-01 NOTE — Anesthesia Postprocedure Evaluation (Signed)
  Anesthesia Post-op Note  Patient: Glenda Nolan  Procedure(s) Performed: Procedure(s) with comments: CATARACT EXTRACTION PHACO AND INTRAOCULAR LENS PLACEMENT (IOC) (Left) - CDE:11.47  Patient Location: PACU and Short Stay  Anesthesia Type:MAC  Level of Consciousness: awake, alert  and oriented  Airway and Oxygen Therapy: Patient Spontanous Breathing  Post-op Pain: none  Post-op Assessment: Post-op Vital signs reviewed, Patient's Cardiovascular Status Stable, Respiratory Function Stable, Patent Airway and No signs of Nausea or vomiting  Post-op Vital Signs: Reviewed and stable  Complications: No apparent anesthesia complications

## 2013-08-01 NOTE — Op Note (Signed)
Date of Admission: 08/01/2013  Date of Surgery: 08/01/2013  Pre-Op Dx: Cataract  Left  Eye  Post-Op Dx: Combined Cataract  Left  Eye,  Dx Code 366.19  Surgeon: Gemma Payor, M.D.  Assistants: None  Anesthesia: Topical with MAC  Indications: Painless, progressive loss of vision with compromise of daily activities.  Surgery: Cataract Extraction with Intraocular lens Implant Left Eye  Discription: The patient had dilating drops and viscous lidocaine placed into the left eye in the pre-op holding area. After transfer to the operating room, a time out was performed. The patient was then prepped and draped. Beginning with a 75 degree blade a paracentesis port was made at the surgeon's 2 o'clock position. The anterior chamber was then filled with 1% non-preserved lidocaine. This was followed by filling the anterior chamber with Provisc. A 2.55mm keratome blade was used to make a clear corneal incision at the temporal limbus. A bent cystatome needle was used to create a continuous tear capsulotomy. Hydrodissection was performed with balanced salt solution on a Fine canula. The lens nucleus was then removed using the phacoemulsification handpiece. Residual cortex was removed with the I&A handpiece. The anterior chamber and capsular bag were refilled with Provisc. A posterior chamber intraocular lens was placed into the capsular bag with it's injector. The implant was positioned with the Kuglan hook. The Provisc was then removed from the anterior chamber and capsular bag with the I&A handpiece. Stromal hydration of the main incision and paracentesis port was performed with BSS on a Fine canula. The wounds were tested for leak which was negative. The patient tolerated the procedure well. There were no operative complications. The patient was then transferred to the recovery room in stable condition.  Complications: None  Specimen: None  EBL: None  Prosthetic device: B&L enVista, MX60, power 24.5D, SN  [id number].

## 2013-08-01 NOTE — H&P (Signed)
I have reviewed the H&P, the patient was re-examined, and I have identified no interval changes in medical condition and plan of care since the history and physical of record  

## 2013-08-01 NOTE — Anesthesia Preprocedure Evaluation (Signed)
Anesthesia Evaluation  Patient identified by MRN, date of birth, ID band Patient awake    Reviewed: Allergy & Precautions, H&P , NPO status , Patient's Chart, lab work & pertinent test results, reviewed documented beta blocker date and time   Airway Mallampati: III TM Distance: >3 FB     Dental  (+) Edentulous Upper   Pulmonary former smoker,  breath sounds clear to auscultation        Cardiovascular hypertension, Rhythm:Regular Rate:Normal     Neuro/Psych  Headaches,    GI/Hepatic GERD-  Medicated and Controlled,  Endo/Other  Hypothyroidism   Renal/GU      Musculoskeletal   Abdominal   Peds  Hematology   Anesthesia Other Findings   Reproductive/Obstetrics                           Anesthesia Physical Anesthesia Plan  ASA: III  Anesthesia Plan: MAC   Post-op Pain Management:    Induction: Intravenous  Airway Management Planned: Nasal Cannula  Additional Equipment:   Intra-op Plan:   Post-operative Plan:   Informed Consent: I have reviewed the patients History and Physical, chart, labs and discussed the procedure including the risks, benefits and alternatives for the proposed anesthesia with the patient or authorized representative who has indicated his/her understanding and acceptance.     Plan Discussed with:   Anesthesia Plan Comments:         Anesthesia Quick Evaluation

## 2013-08-02 ENCOUNTER — Encounter (HOSPITAL_COMMUNITY): Payer: Self-pay | Admitting: Ophthalmology

## 2013-08-10 ENCOUNTER — Encounter (HOSPITAL_COMMUNITY): Payer: Self-pay | Admitting: Pharmacy Technician

## 2013-08-10 ENCOUNTER — Encounter (HOSPITAL_COMMUNITY)
Admission: RE | Admit: 2013-08-10 | Discharge: 2013-08-10 | Disposition: A | Discharge status: Home / Self Care | Payer: PRIVATE HEALTH INSURANCE | Source: Ambulatory Visit | Attending: Ophthalmology | Admitting: Ophthalmology

## 2013-08-10 MED ORDER — FENTANYL CITRATE 0.05 MG/ML IJ SOLN: 25.0000 ug | INTRAMUSCULAR | Status: DC | PRN

## 2013-08-10 MED ORDER — ONDANSETRON HCL 4 MG/2ML IJ SOLN: 4.0000 mg | Freq: Once | INTRAMUSCULAR | Status: AC | PRN

## 2013-08-12 MED ORDER — CYCLOPENTOLATE-PHENYLEPHRINE 0.2-1 % OP SOLN: 1.0000 [drp] | OPHTHALMIC | Status: DC

## 2013-08-12 MED ORDER — LIDOCAINE HCL 3.5 % OP GEL
OPHTHALMIC | Status: AC
  Filled 2013-08-12: qty 5

## 2013-08-12 MED ORDER — LIDOCAINE HCL (PF) 1 % IJ SOLN
INTRAMUSCULAR | Status: AC
  Filled 2013-08-12: qty 2

## 2013-08-12 MED ORDER — CYCLOPENTOLATE-PHENYLEPHRINE OP SOLN OPTIME - NO CHARGE
OPHTHALMIC | Status: AC
  Filled 2013-08-12: qty 2

## 2013-08-12 MED ORDER — NEOMYCIN-POLYMYXIN-DEXAMETH 3.5-10000-0.1 OP OINT
TOPICAL_OINTMENT | OPHTHALMIC | Status: AC
  Filled 2013-08-12: qty 3.5

## 2013-08-12 MED ORDER — KETOROLAC TROMETHAMINE 0.5 % OP SOLN: 1.0000 [drp] | OPHTHALMIC | Status: DC

## 2013-08-12 MED ORDER — TETRACAINE HCL 0.5 % OP SOLN
OPHTHALMIC | Status: AC
  Filled 2013-08-12: qty 2

## 2013-08-15 ENCOUNTER — Encounter (HOSPITAL_COMMUNITY): Payer: Self-pay | Admitting: *Deleted

## 2013-08-15 ENCOUNTER — Encounter (HOSPITAL_COMMUNITY): Payer: Self-pay | Admitting: Anesthesiology

## 2013-08-15 ENCOUNTER — Ambulatory Visit (HOSPITAL_COMMUNITY): Payer: PRIVATE HEALTH INSURANCE | Admitting: Anesthesiology

## 2013-08-15 ENCOUNTER — Ambulatory Visit (HOSPITAL_COMMUNITY)
Admission: RE | Admit: 2013-08-15 | Discharge: 2013-08-15 | Disposition: A | Discharge status: Home / Self Care | Payer: PRIVATE HEALTH INSURANCE | Source: Ambulatory Visit | Attending: Ophthalmology | Admitting: Ophthalmology

## 2013-08-15 ENCOUNTER — Encounter (HOSPITAL_COMMUNITY)
Admission: RE | Disposition: A | Discharge status: Home / Self Care | Payer: Self-pay | Source: Ambulatory Visit | Attending: Ophthalmology

## 2013-08-15 DIAGNOSIS — H2589 Other age-related cataract: Secondary | ICD-10-CM | POA: Insufficient documentation

## 2013-08-15 DIAGNOSIS — I10 Essential (primary) hypertension: Secondary | ICD-10-CM | POA: Insufficient documentation

## 2013-08-15 HISTORY — PX: CATARACT EXTRACTION W/PHACO: SHX586

## 2013-08-15 SURGERY — PHACOEMULSIFICATION, CATARACT, WITH IOL INSERTION
Anesthesia: Monitor Anesthesia Care | Site: Eye | Laterality: Right | Wound class: Clean

## 2013-08-15 MED ORDER — LIDOCAINE HCL (PF) 1 % IJ SOLN
INTRAMUSCULAR | Status: DC | PRN
  Administered 2013-08-15: .5 mL

## 2013-08-15 MED ORDER — EPINEPHRINE HCL 1 MG/ML IJ SOLN
INTRAOCULAR | Status: DC | PRN
  Administered 2013-08-15: 09:00:00

## 2013-08-15 MED ORDER — CYCLOPENTOLATE-PHENYLEPHRINE 0.2-1 % OP SOLN
1.0000 [drp] | OPHTHALMIC | Status: AC
  Administered 2013-08-15 (×3): 1 [drp] via OPHTHALMIC

## 2013-08-15 MED ORDER — LACTATED RINGERS IV SOLN
INTRAVENOUS | Status: DC
  Administered 2013-08-15: 1000 mL via INTRAVENOUS

## 2013-08-15 MED ORDER — POVIDONE-IODINE 5 % OP SOLN
OPHTHALMIC | Status: DC | PRN
  Administered 2013-08-15: 1 via OPHTHALMIC

## 2013-08-15 MED ORDER — MIDAZOLAM HCL 2 MG/2ML IJ SOLN
INTRAMUSCULAR | Status: AC
  Filled 2013-08-15: qty 2

## 2013-08-15 MED ORDER — LACTATED RINGERS IV SOLN
INTRAVENOUS | Status: DC | PRN
  Administered 2013-08-15: 09:00:00 via INTRAVENOUS

## 2013-08-15 MED ORDER — BSS IO SOLN
INTRAOCULAR | Status: DC | PRN
  Administered 2013-08-15: 15 mL via INTRAOCULAR

## 2013-08-15 MED ORDER — PROVISC 10 MG/ML IO SOLN
INTRAOCULAR | Status: DC | PRN
  Administered 2013-08-15: 8.5 mg via INTRAOCULAR

## 2013-08-15 MED ORDER — MIDAZOLAM HCL 2 MG/2ML IJ SOLN
1.0000 mg | INTRAMUSCULAR | Status: DC | PRN
  Administered 2013-08-15: 2 mg via INTRAVENOUS

## 2013-08-15 MED ORDER — LIDOCAINE HCL 3.5 % OP GEL
Freq: Once | OPHTHALMIC | Status: AC
  Administered 2013-08-15: 09:00:00 via OPHTHALMIC

## 2013-08-15 MED ORDER — PHENYLEPHRINE HCL 2.5 % OP SOLN
1.0000 [drp] | OPHTHALMIC | Status: AC
  Administered 2013-08-15 (×3): 1 [drp] via OPHTHALMIC

## 2013-08-15 MED ORDER — LIDOCAINE 3.5 % OP GEL OPTIME - NO CHARGE
OPHTHALMIC | Status: DC | PRN
  Administered 2013-08-15: 2 [drp] via OPHTHALMIC

## 2013-08-15 MED ORDER — FENTANYL CITRATE 0.05 MG/ML IJ SOLN
INTRAMUSCULAR | Status: AC
  Filled 2013-08-15: qty 2

## 2013-08-15 MED ORDER — EPINEPHRINE HCL 1 MG/ML IJ SOLN
INTRAMUSCULAR | Status: AC
  Filled 2013-08-15: qty 1

## 2013-08-15 MED ORDER — NEOMYCIN-POLYMYXIN-DEXAMETH 0.1 % OP OINT
TOPICAL_OINTMENT | OPHTHALMIC | Status: DC | PRN
  Administered 2013-08-15: 1 via OPHTHALMIC

## 2013-08-15 MED ORDER — TETRACAINE HCL 0.5 % OP SOLN
1.0000 [drp] | OPHTHALMIC | Status: AC
  Administered 2013-08-15 (×3): 1 [drp] via OPHTHALMIC

## 2013-08-15 SURGICAL SUPPLY — 32 items
CAPSULAR TENSION RING-AMO (OPHTHALMIC RELATED) IMPLANT
CLOTH BEACON ORANGE TIMEOUT ST (SAFETY) ×1 IMPLANT
EYE SHIELD UNIVERSAL CLEAR (GAUZE/BANDAGES/DRESSINGS) ×1 IMPLANT
GLOVE BIO SURGEON STRL SZ 6.5 (GLOVE) IMPLANT
GLOVE BIOGEL PI IND STRL 6.5 (GLOVE) IMPLANT
GLOVE BIOGEL PI IND STRL 7.0 (GLOVE) IMPLANT
GLOVE BIOGEL PI IND STRL 7.5 (GLOVE) IMPLANT
GLOVE BIOGEL PI INDICATOR 6.5 (GLOVE) ×1
GLOVE BIOGEL PI INDICATOR 7.0 (GLOVE)
GLOVE BIOGEL PI INDICATOR 7.5 (GLOVE)
GLOVE ECLIPSE 6.5 STRL STRAW (GLOVE) IMPLANT
GLOVE ECLIPSE 7.0 STRL STRAW (GLOVE) IMPLANT
GLOVE ECLIPSE 7.5 STRL STRAW (GLOVE) IMPLANT
GLOVE EXAM NITRILE LRG STRL (GLOVE) ×1 IMPLANT
GLOVE EXAM NITRILE MD LF STRL (GLOVE) IMPLANT
GLOVE SKINSENSE NS SZ6.5 (GLOVE)
GLOVE SKINSENSE NS SZ7.0 (GLOVE)
GLOVE SKINSENSE STRL SZ6.5 (GLOVE) IMPLANT
GLOVE SKINSENSE STRL SZ7.0 (GLOVE) IMPLANT
KIT VITRECTOMY (OPHTHALMIC RELATED) IMPLANT
PAD ARMBOARD 7.5X6 YLW CONV (MISCELLANEOUS) ×1 IMPLANT
PROC W NO LENS (INTRAOCULAR LENS)
PROC W SPEC LENS (INTRAOCULAR LENS)
PROCESS W NO LENS (INTRAOCULAR LENS) IMPLANT
PROCESS W SPEC LENS (INTRAOCULAR LENS) IMPLANT
RING MALYGIN (MISCELLANEOUS) IMPLANT
SIGHTPATH CAT PROC W REG LENS (Ophthalmic Related) ×2 IMPLANT
SYR TB 1ML LL NO SAFETY (SYRINGE) ×1 IMPLANT
TAPE SURG TRANSPORE 1 IN (GAUZE/BANDAGES/DRESSINGS) IMPLANT
TAPE SURGICAL TRANSPORE 1 IN (GAUZE/BANDAGES/DRESSINGS) ×1
VISCOELASTIC ADDITIONAL (OPHTHALMIC RELATED) IMPLANT
WATER STERILE IRR 250ML POUR (IV SOLUTION) ×2 IMPLANT

## 2013-08-15 NOTE — H&P (Signed)
I have reviewed the H&P, the patient was re-examined, and I have identified no interval changes in medical condition and plan of care since the history and physical of record  

## 2013-08-15 NOTE — Op Note (Signed)
Date of Admission: 08/15/2013  Date of Surgery: 08/15/2013  Pre-Op Dx: Cataract  Right  Eye  Post-Op Dx: Cataract  Right  Eye,  Dx Code 366.19  Surgeon: Gemma Payor, M.D.  Assistants: None  Anesthesia: Topical with MAC  Indications: Painless, progressive loss of vision with compromise of daily activities.  Surgery: Cataract Extraction with Intraocular lens Implant Right Eye  Discription: The patient had dilating drops and viscous lidocaine placed into the right eye in the pre-op holding area. After transfer to the operating room, a time out was performed. The patient was then prepped and draped. Beginning with a 75 degree blade a paracentesis port was made at the surgeon's 2 o'clock position. The anterior chamber was then filled with 1% non-preserved lidocaine. This was followed by filling the anterior chamber with Provisc.  A 2.75mm keratome blade was used to make a clear corneal incision at the temporal limbus.  A bent cystatome needle was used to create a continuous tear capsulotomy. Hydrodissection was performed with balanced salt solution on a Fine canula. The lens nucleus was then removed using the phacoemulsification handpiece. Residual cortex was removed with the I&A handpiece. The anterior chamber and capsular bag were refilled with Provisc. A posterior chamber intraocular lens was placed into the capsular bag with it's injector. The implant was positioned with the Kuglan hook. The Provisc was then removed from the anterior chamber and capsular bag with the I&A handpiece. Stromal hydration of the main incision and paracentesis port was performed with BSS on a Fine canula. The wounds were tested for leak which was negative. The patient tolerated the procedure well. There were no operative complications. The patient was then transferred to the recovery room in stable condition.  Complications: None  Specimen: None  EBL: None  Prosthetic device: B&L enVista, MX60, power 25.5D, SN  [id number].

## 2013-08-15 NOTE — Transfer of Care (Signed)
Immediate Anesthesia Transfer of Care Note  Patient: Glenda Nolan  Procedure(s) Performed: Procedure(s) with comments: CATARACT EXTRACTION PHACO AND INTRAOCULAR LENS PLACEMENT (IOC) (Right) - CDE:  16.81  Patient Location: Short Stay  Anesthesia Type:MAC  Level of Consciousness: awake, alert , oriented and patient cooperative  Airway & Oxygen Therapy: Patient Spontanous Breathing  Post-op Assessment: Report given to PACU RN and Post -op Vital signs reviewed and stable  Post vital signs: Reviewed and stable  Complications: No apparent anesthesia complications

## 2013-08-15 NOTE — Anesthesia Preprocedure Evaluation (Signed)
Anesthesia Evaluation  Patient identified by MRN, date of birth, ID band Patient awake    Reviewed: Allergy & Precautions, H&P , NPO status , Patient's Chart, lab work & pertinent test results, reviewed documented beta blocker date and time   Airway Mallampati: III TM Distance: >3 FB     Dental  (+) Edentulous Upper   Pulmonary former smoker,  breath sounds clear to auscultation        Cardiovascular hypertension, Rhythm:Regular Rate:Normal     Neuro/Psych  Headaches,    GI/Hepatic GERD-  Medicated and Controlled,  Endo/Other  Hypothyroidism   Renal/GU      Musculoskeletal   Abdominal   Peds  Hematology   Anesthesia Other Findings   Reproductive/Obstetrics                           Anesthesia Physical Anesthesia Plan  ASA: III  Anesthesia Plan: MAC   Post-op Pain Management:    Induction: Intravenous  Airway Management Planned: Nasal Cannula  Additional Equipment:   Intra-op Plan:   Post-operative Plan:   Informed Consent: I have reviewed the patients History and Physical, chart, labs and discussed the procedure including the risks, benefits and alternatives for the proposed anesthesia with the patient or authorized representative who has indicated his/her understanding and acceptance.     Plan Discussed with:   Anesthesia Plan Comments:         Anesthesia Quick Evaluation  

## 2013-08-15 NOTE — Anesthesia Postprocedure Evaluation (Signed)
  Anesthesia Post-op Note  Patient: Glenda Nolan  Procedure(s) Performed: Procedure(s) with comments: CATARACT EXTRACTION PHACO AND INTRAOCULAR LENS PLACEMENT (IOC) (Right) - CDE:  16.81  Patient Location: Short Stay  Anesthesia Type:MAC  Level of Consciousness: awake, alert , oriented and patient cooperative  Airway and Oxygen Therapy: Patient Spontanous Breathing  Post-op Pain: none  Post-op Assessment: Post-op Vital signs reviewed, Patient's Cardiovascular Status Stable, Respiratory Function Stable, Patent Airway, No signs of Nausea or vomiting and Pain level controlled  Post-op Vital Signs: Reviewed and stable  Complications: No apparent anesthesia complications

## 2013-08-15 NOTE — Anesthesia Procedure Notes (Signed)
Procedure Name: MAC Date/Time: 08/15/2013 9:27 AM Performed by: Carolyne Littles, AMY L Pre-anesthesia Checklist: Patient identified, Timeout performed, Emergency Drugs available, Suction available and Patient being monitored Oxygen Delivery Method: Nasal cannula

## 2013-08-16 ENCOUNTER — Encounter (HOSPITAL_COMMUNITY): Payer: Self-pay | Admitting: Ophthalmology

## 2014-01-25 ENCOUNTER — Other Ambulatory Visit (HOSPITAL_COMMUNITY): Payer: Self-pay | Admitting: Internal Medicine

## 2014-01-25 DIAGNOSIS — Z1231 Encounter for screening mammogram for malignant neoplasm of breast: Secondary | ICD-10-CM

## 2014-01-30 ENCOUNTER — Ambulatory Visit (HOSPITAL_COMMUNITY)
Admission: RE | Admit: 2014-01-30 | Discharge: 2014-01-30 | Disposition: A | Discharge status: Home / Self Care | Payer: PRIVATE HEALTH INSURANCE | Source: Ambulatory Visit | Attending: Internal Medicine | Admitting: Internal Medicine

## 2014-01-30 DIAGNOSIS — Z1231 Encounter for screening mammogram for malignant neoplasm of breast: Secondary | ICD-10-CM | POA: Insufficient documentation

## 2014-04-25 ENCOUNTER — Encounter: Payer: Self-pay | Admitting: Internal Medicine

## 2014-12-29 ENCOUNTER — Other Ambulatory Visit (HOSPITAL_COMMUNITY): Payer: Self-pay | Admitting: Internal Medicine

## 2014-12-29 DIAGNOSIS — Z1231 Encounter for screening mammogram for malignant neoplasm of breast: Secondary | ICD-10-CM

## 2015-02-02 ENCOUNTER — Ambulatory Visit (HOSPITAL_COMMUNITY)
Admission: RE | Admit: 2015-02-02 | Discharge: 2015-02-02 | Disposition: A | Discharge status: Home / Self Care | Payer: Medicare Other | Source: Ambulatory Visit | Attending: Internal Medicine | Admitting: Internal Medicine

## 2015-02-02 DIAGNOSIS — Z1231 Encounter for screening mammogram for malignant neoplasm of breast: Secondary | ICD-10-CM | POA: Diagnosis not present

## 2015-07-24 DIAGNOSIS — M810 Age-related osteoporosis without current pathological fracture: Secondary | ICD-10-CM | POA: Diagnosis not present

## 2015-07-24 DIAGNOSIS — E782 Mixed hyperlipidemia: Secondary | ICD-10-CM | POA: Diagnosis not present

## 2015-07-24 DIAGNOSIS — I10 Essential (primary) hypertension: Secondary | ICD-10-CM | POA: Diagnosis not present

## 2015-07-25 DIAGNOSIS — K210 Gastro-esophageal reflux disease with esophagitis: Secondary | ICD-10-CM | POA: Diagnosis not present

## 2015-07-25 DIAGNOSIS — E669 Obesity, unspecified: Secondary | ICD-10-CM | POA: Diagnosis not present

## 2015-07-25 DIAGNOSIS — E782 Mixed hyperlipidemia: Secondary | ICD-10-CM | POA: Diagnosis not present

## 2015-07-25 DIAGNOSIS — I10 Essential (primary) hypertension: Secondary | ICD-10-CM | POA: Diagnosis not present

## 2015-07-25 DIAGNOSIS — E039 Hypothyroidism, unspecified: Secondary | ICD-10-CM | POA: Diagnosis not present

## 2015-07-25 DIAGNOSIS — Z79899 Other long term (current) drug therapy: Secondary | ICD-10-CM | POA: Diagnosis not present

## 2015-08-03 DIAGNOSIS — R7301 Impaired fasting glucose: Secondary | ICD-10-CM | POA: Diagnosis not present

## 2015-08-03 DIAGNOSIS — I10 Essential (primary) hypertension: Secondary | ICD-10-CM | POA: Diagnosis not present

## 2015-08-03 DIAGNOSIS — E039 Hypothyroidism, unspecified: Secondary | ICD-10-CM | POA: Diagnosis not present

## 2015-10-29 DIAGNOSIS — L232 Allergic contact dermatitis due to cosmetics: Secondary | ICD-10-CM | POA: Diagnosis not present

## 2015-10-29 DIAGNOSIS — Z23 Encounter for immunization: Secondary | ICD-10-CM | POA: Diagnosis not present

## 2016-01-09 ENCOUNTER — Other Ambulatory Visit (HOSPITAL_COMMUNITY): Payer: Self-pay | Admitting: Internal Medicine

## 2016-01-09 DIAGNOSIS — Z1231 Encounter for screening mammogram for malignant neoplasm of breast: Secondary | ICD-10-CM

## 2016-02-04 ENCOUNTER — Ambulatory Visit (HOSPITAL_COMMUNITY)
Admission: RE | Admit: 2016-02-04 | Discharge: 2016-02-04 | Disposition: A | Discharge status: Home / Self Care | Payer: Medicare Other | Source: Ambulatory Visit | Attending: Internal Medicine | Admitting: Internal Medicine

## 2016-02-04 DIAGNOSIS — Z1231 Encounter for screening mammogram for malignant neoplasm of breast: Secondary | ICD-10-CM | POA: Diagnosis not present

## 2016-02-19 DIAGNOSIS — E039 Hypothyroidism, unspecified: Secondary | ICD-10-CM | POA: Diagnosis not present

## 2016-02-19 DIAGNOSIS — E782 Mixed hyperlipidemia: Secondary | ICD-10-CM | POA: Diagnosis not present

## 2016-02-20 DIAGNOSIS — E782 Mixed hyperlipidemia: Secondary | ICD-10-CM | POA: Diagnosis not present

## 2016-02-20 DIAGNOSIS — R7301 Impaired fasting glucose: Secondary | ICD-10-CM | POA: Diagnosis not present

## 2016-02-20 DIAGNOSIS — E039 Hypothyroidism, unspecified: Secondary | ICD-10-CM | POA: Diagnosis not present

## 2016-02-20 DIAGNOSIS — I10 Essential (primary) hypertension: Secondary | ICD-10-CM | POA: Diagnosis not present

## 2016-08-11 DIAGNOSIS — E039 Hypothyroidism, unspecified: Secondary | ICD-10-CM | POA: Diagnosis not present

## 2016-08-11 DIAGNOSIS — R7301 Impaired fasting glucose: Secondary | ICD-10-CM | POA: Diagnosis not present

## 2016-08-11 DIAGNOSIS — I10 Essential (primary) hypertension: Secondary | ICD-10-CM | POA: Diagnosis not present

## 2016-08-11 DIAGNOSIS — E782 Mixed hyperlipidemia: Secondary | ICD-10-CM | POA: Diagnosis not present

## 2016-08-13 DIAGNOSIS — I10 Essential (primary) hypertension: Secondary | ICD-10-CM | POA: Diagnosis not present

## 2016-08-13 DIAGNOSIS — M816 Localized osteoporosis [Lequesne]: Secondary | ICD-10-CM | POA: Diagnosis not present

## 2016-08-13 DIAGNOSIS — Z23 Encounter for immunization: Secondary | ICD-10-CM | POA: Diagnosis not present

## 2016-08-13 DIAGNOSIS — E039 Hypothyroidism, unspecified: Secondary | ICD-10-CM | POA: Diagnosis not present

## 2016-08-13 DIAGNOSIS — E782 Mixed hyperlipidemia: Secondary | ICD-10-CM | POA: Diagnosis not present

## 2016-08-25 ENCOUNTER — Other Ambulatory Visit (HOSPITAL_COMMUNITY): Payer: Self-pay | Admitting: Internal Medicine

## 2016-08-25 DIAGNOSIS — M810 Age-related osteoporosis without current pathological fracture: Secondary | ICD-10-CM

## 2016-09-02 ENCOUNTER — Ambulatory Visit (HOSPITAL_COMMUNITY)
Admission: RE | Admit: 2016-09-02 | Discharge: 2016-09-02 | Disposition: A | Discharge status: Home / Self Care | Payer: Medicare Other | Source: Ambulatory Visit | Attending: Internal Medicine | Admitting: Internal Medicine

## 2016-09-02 DIAGNOSIS — M810 Age-related osteoporosis without current pathological fracture: Secondary | ICD-10-CM

## 2016-11-26 DIAGNOSIS — Z961 Presence of intraocular lens: Secondary | ICD-10-CM | POA: Diagnosis not present

## 2016-11-26 DIAGNOSIS — H26493 Other secondary cataract, bilateral: Secondary | ICD-10-CM | POA: Diagnosis not present

## 2016-11-26 DIAGNOSIS — Z6823 Body mass index (BMI) 23.0-23.9, adult: Secondary | ICD-10-CM | POA: Diagnosis not present

## 2016-11-26 DIAGNOSIS — H524 Presbyopia: Secondary | ICD-10-CM | POA: Diagnosis not present

## 2016-11-26 DIAGNOSIS — H26491 Other secondary cataract, right eye: Secondary | ICD-10-CM | POA: Diagnosis not present

## 2016-11-26 DIAGNOSIS — M25562 Pain in left knee: Secondary | ICD-10-CM | POA: Diagnosis not present

## 2016-11-26 DIAGNOSIS — H26492 Other secondary cataract, left eye: Secondary | ICD-10-CM | POA: Diagnosis not present

## 2016-12-09 ENCOUNTER — Ambulatory Visit (HOSPITAL_COMMUNITY): Payer: Medicare Other | Attending: Internal Medicine

## 2016-12-09 DIAGNOSIS — M6281 Muscle weakness (generalized): Secondary | ICD-10-CM | POA: Diagnosis not present

## 2016-12-09 DIAGNOSIS — R262 Difficulty in walking, not elsewhere classified: Secondary | ICD-10-CM | POA: Insufficient documentation

## 2016-12-09 DIAGNOSIS — R296 Repeated falls: Secondary | ICD-10-CM | POA: Diagnosis not present

## 2016-12-09 NOTE — Therapy (Signed)
[facility] [place], [place], [address] Phone: [phone]   Fax:  [phone]  Physical Therapy Evaluation  Patient Details  Name: Glenda Nolan MRN: [id number] Date of Birth: 12-01-1934 Referring Provider: Delaine Lame, MD   Encounter Date: 12/09/2016      PT End of Session - 12/09/16 1409    Visit Number 1   Number of Visits 8   Date for PT Re-Evaluation 01/06/17   Authorization Type [employer] Medicare    Authorization Time Period 12/09/16-01/09/17   Authorization - Visit Number 1   Authorization - Number of Visits 10   PT Start Time 1301   PT Stop Time 1400   PT Time Calculation (min) 59 min   Equipment Utilized During Treatment Gait belt;Other (comment)  bilat knee braces, neoprene only, non supportive   Activity Tolerance Patient tolerated treatment well;Patient limited by fatigue;No increased pain   Behavior During Therapy WFL for tasks assessed/performed      Past Medical History:  Diagnosis Date  . Arthritis   . Cataracts, bilateral    pending surgery May 2013  . Colon cancer 1994   Stage IIB  . Diverticulosis   . GERD (gastroesophageal reflux disease)   . HTN (hypertension)   . Hyperlipidemia   . Hypothyroidism   . Migraines   . Osteoporosis     Past Surgical History:  Procedure Laterality Date  . CATARACT EXTRACTION W/PHACO Left 08/01/2013   Procedure: CATARACT EXTRACTION PHACO AND INTRAOCULAR LENS PLACEMENT (IOC);  Surgeon: Tonny Branch, MD;  Location: [facility];  Service: Ophthalmology;  Laterality: Left;  CDE:11.47  . CATARACT EXTRACTION W/PHACO Right 08/15/2013   Procedure: CATARACT EXTRACTION PHACO AND INTRAOCULAR LENS PLACEMENT (IOC);  Surgeon: Tonny Branch, MD;  Location: [facility];  Service: Ophthalmology;  Laterality: Right;  CDE:  16.81  . COLON SURGERY  1994   Right  . COLONOSCOPY  04/01/2012   Rourk-Colonic ulcers at the anastomosis likely NSAID related, although ischemia is not excluded, status post  biopsy, left-sided diverticula.  Internal hemorrhoids and anal papilla  . COLONOSCOPY W/ BIOPSIES  05/04/09   Dr. Madolyn Frieze papilla, diverticulosis  . DILATION AND CURETTAGE OF UTERUS    . ESOPHAGOGASTRODUODENOSCOPY  04/01/2012   Rourk-Normal esophagus, small hiatal hernia, deformity, scarring friability of the antrum/prepyloric mucosa suggestive of prior peptic ulcer disease, normal D1 and D2.  . HEMORRHOID SURGERY    . knee arthrocopy(Right)      There were no vitals filed for this visit.       Subjective Assessment - 12/09/16 1309    Subjective Pt reports she has been having problems with her left knee for a couple years but worse so lately, very sore. She says that her legs give out on her after walking long distances. Pt reports she has had about 4-5 falls in the last 3 months. Pt reports she has not hit the floor hard enough to warrant a 91 call, and she has gotten herself up independently as she lives alone.  She also has some falls related to navigating steps and curbs in the community.  PSH: Rt TKA ~2009.   Pertinent History Pt reports she has been having problems with her left knee for a couple years but worse so lately, very sore. She says that her legs give out on her after walking long distances. Pt reports she has had about 4-5 falls in the last 3 months. Pt reports she has not hit the floor hard  enough to warrant a 91 call, and she has gotten herself up independently as she lives alone.  She also has some falls related to navigating steps and curbs in the community.  PSH: Rt TKA ~2009.    How long can you sit comfortably? No limited, up to 2 hours.    How long can you stand comfortably? Not limited by pain, but reports that it might buckle on her.    How long can you walk comfortably? Can tolerate limited community distances with UE support on cart.    Diagnostic tests None on the left   Patient Stated Goals Improve her Left nee instability and decrease falls   Currently in  Pain? Yes   Pain Score 4    Pain Location Knee   Pain Orientation Left   Pain Descriptors / Indicators Sharp;Aching;Throbbing   Pain Type Chronic pain   Pain Onset More than a month ago  "years"   Pain Frequency Intermittent   Aggravating Factors  mis-step or slip, steps or curbs   Pain Relieving Factors Warm bath, deies tylenol or hot pad            [facility] PT Assessment - 12/09/16 0001      Assessment   Medical Diagnosis Falls and knee instability, Lt    Referring Provider Delaine Lame, MD    Onset Date/Surgical Date --  severe years now   Hand Dominance Right  ambidexterous with [facility]   Next MD Visit Unsure   Prior Therapy None     Precautions   Precautions None     Restrictions   Weight Bearing Restrictions No     Balance Screen   Has the patient fallen in the past 6 months Yes   How many times? 5  Left knee gives way   Has the patient had a decrease in activity level because of a fear of falling?  No   Is the patient reluctant to leave their home because of a fear of falling?  No     Home Social worker Private residence   Living Arrangements Alone   Available Help at Discharge Family   Type of [place] to enter   Entrance Stairs-Number of Steps 1   Entrance Stairs-Rails Right   Home Layout One level   [place] - single point;Walker - standard     Prior Function   Level of Independence Independent with basic ADLs;Independent with community mobility with device  manages her meds; children provide transportation, cooks   Vocation Retired   Leisure TV     ROM / Strength   AROM / PROM / Strength PROM;Strength     PROM   PROM Assessment Site Knee   Right/Left Knee Right;Left   Right Knee Extension --  0-130 degrees flexion   Left Knee Extension PROM: -14-135     Strength   Strength Assessment Site Hip;Knee;Ankle   Right Hip Flexion 5/5   Right Hip Extension 5/5   Right Hip External Rotation   4+/5  seated   Right Hip Internal Rotation 4+/5  seated   Right Hip ABduction 3/5  hoizontal abduction seated: 4/5   Right Hip ADduction 4/5   Left Hip Flexion 4/5   Left Hip Extension 3/5   Left Hip External Rotation 4+/5   Left Hip Internal Rotation 5/5   Left Hip ABduction 3/5  hoizontal abduction seated: 4-/5   Left Hip ADduction 4/5  Right Knee Flexion 5/5   Right Knee Extension 5/5   Left Knee Flexion 5/5   Left Knee Extension 5/5     Special Tests    Special Tests Q-Angel (Patellofemoral Angel);Knee Special Tests   Knee Special tests  other   Q-Angle (Patellofemoral Angle) Right;Left     other    Findings Positive   Side  Left   Comments valgus instability, with 5-10 degrees of springy varus movement      Q-Angle (Patellofemoral Angle)- Right   Angle in degrees-Right  13 degrees     Q-Angle (Patellofemoral Angle)- Left   Angle in degrees-Left 23 degrees     Transfers   Five time sit to stand comments  23s, arms crossed chest, with significant medial collapse, quads dominance, limited hip ext strength.   Comments some balance deficits noted     Ambulation/Gait   Ambulation Distance (Feet) 450 Feet   Assistive device Rolling walker   Gait velocity 0.34m/s   Gait Comments Terminal RR: 28, HR: 108  Significant RLE toe-in, recurvatum, and valgus.                            PT Education - 12/09/16 1408    Education provided Yes   Education Details degenerative :eft knee findings warrant assessment from orthopedic standpoint.    Person(s) Educated Patient;Child(ren)  Neoma Laming    Methods Explanation;Demonstration   Comprehension Verbalized understanding          PT Short Term Goals - 12/09/16 1422      PT SHORT TERM GOAL #1   Title After 4 weeks patient will demonstrate ability to tolerate 6MWT with RW with overall gait speed >0.56m/s in order to improve safe access to the community.    Baseline 4 minutes only at eval, avg gait speed:  0.26m/s    Status New     PT SHORT TERM GOAL #2   Title After 4 weeks patient will demonstrate improved safety for home mobility AEB TUG:<14 seconds.    Baseline Tug: 23s.      PT SHORT TERM GOAL #3   Title After 4 weeks patient will demonstrate improved funcitonal strength AEB 5xSTS:<15s.    Status New     PT SHORT TERM GOAL #4   Title After 4 weeks patient will demonstrate decreased falls risk AEB BBT score >42 to improve safety at home.    Status New                  Plan - 12/09/16 1410    Clinical Impression Statement Pt presenting after multiple recent falls at home, mediated by Lt knee buckling after prolonged use, pt denying LOB or dizziness. Examination reveals MMT strength globally WFL, with some isolated deficits in the left hip rotators, Left sided gluteus maximus, and bilateral hip abductors. Funcitonal mobility is impaired AEB self selected gait speed of <0.52m/s (indicative of falls risk), and tolerating only 4 minutes of sustained AMB. 5xSTS demonstrates functional weakness and falls risk with LOB, and >25s to perform hands free. gait demonstrates Left sided toe-in >25 degrees, as well as recurvatum >10 degrees, and valgus deformity >15 degrees. joint assessment reveals Left Q angle: 23* compared to 13* on the Rt side and Knee recurvatum of 14 degree son the Left. instability of the left knee joint is consistent with chronic impairment of >5 years in the opinion of this PT, and warrants additional FU with an orthopedist,  as the patient commences PT to address other isolated deficits. Of note: balance deficits seen in evaluation visit will be investigated more thoroughly at next visit. Pt will benefit from skilled PT intervention to address deficits outlined in this evaluation note, to decrease instances of left knee buckling/falls, improve functional mobility in the community and in the home, and restore to PLOF.    Rehab Potential Fair   PT Frequency 2x / week   PT  Duration 4 weeks   PT Treatment/Interventions ADLs/Self Care Home Management;Therapeutic activities;Therapeutic exercise;Dry needling;Balance training;Gait training;Stair training;Energy conservation;Passive range of motion;Patient/family education;Orthotic Fit/Training;Cryotherapy;Electrical Stimulation;Neuromuscular re-education   PT Next Visit Plan Berg Balance Test, Review treatment goals, isolated right hip extension strengtthening, and bilat hip rotation strengthening, and bilatt hip abduction strengthening.    PT Home Exercise Plan none this visit    Recommended Other Services Orthopedic consult    Consulted and Agree with Plan of Care Family member/caregiver;Patient   Family Member Consulted Neoma Laming (daughter)       Patient will benefit from skilled therapeutic intervention in order to improve the following deficits and impairments:  Abnormal gait, Decreased range of motion, Difficulty walking, Cardiopulmonary status limiting activity, Decreased mobility, Decreased strength, Postural dysfunction, Improper body mechanics, Hypermobility, Decreased knowledge of use of DME, Decreased safety awareness, Decreased activity tolerance, Decreased balance  Visit Diagnosis: Repeated falls - Plan: PT plan of care cert/re-cert  Difficulty in walking, not elsewhere classified - Plan: PT plan of care cert/re-cert  Muscle weakness (generalized) - Plan: PT plan of care cert/re-cert      G-Codes - [id number]    Functional Assessment Tool Used TUG: > 20= 88.9% impaired    Functional Limitation Mobility: Walking and moving around   Mobility: Walking and Moving Around Current Status [id number]) At least 80 percent but less than 100 percent impaired, limited or restricted   Mobility: Walking and Moving Around Goal Status [id number]) At least 20 percent but less than 40 percent impaired, limited or restricted  TUG: 15       Problem List Patient Active Problem List   Diagnosis Date Noted  . Heme positive  stool 03/22/2012  . Melena 03/22/2012  . GERD (gastroesophageal reflux disease) 03/22/2012  . CARCINOMA, COLON 04/26/2009  . MIGRAINE HEADACHE 04/26/2009  . HYPERTENSION 04/26/2009  . DEGENERATIVE JOINT DISEASE 04/26/2009  . IRON DEFICIENCY ANEMIA, HX OF 04/26/2009  . KNEE, ARTHRITIS, DEGEN./OSTEO 03/01/2009  . KNEE PAIN 03/01/2009    2:35 PM, 12/09/16 Etta Grandchild, PT, DPT Physical Therapist at [facility] [phone] (office)     [facility] [address] [place], [place], [address] Phone: [phone]   Fax:  [phone]  Name: JANET GOELZ MRN: [id number] Date of Birth: 11-10-1935

## 2016-12-11 ENCOUNTER — Encounter (HOSPITAL_COMMUNITY): Payer: Self-pay | Admitting: Physical Therapy

## 2016-12-15 ENCOUNTER — Telehealth (HOSPITAL_COMMUNITY): Payer: Self-pay

## 2016-12-15 ENCOUNTER — Ambulatory Visit (HOSPITAL_COMMUNITY): Payer: Medicare Other

## 2016-12-15 NOTE — Telephone Encounter (Signed)
Talked to her  daughter [name] at [phone], she stated she will get her mother her and not to cx this apptment. NF 12/15/16

## 2016-12-17 ENCOUNTER — Ambulatory Visit (HOSPITAL_COMMUNITY): Payer: Medicare Other

## 2016-12-17 ENCOUNTER — Telehealth (HOSPITAL_COMMUNITY): Payer: Self-pay

## 2016-12-17 DIAGNOSIS — R296 Repeated falls: Secondary | ICD-10-CM

## 2016-12-17 DIAGNOSIS — R262 Difficulty in walking, not elsewhere classified: Secondary | ICD-10-CM

## 2016-12-17 DIAGNOSIS — M6281 Muscle weakness (generalized): Secondary | ICD-10-CM | POA: Diagnosis not present
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
## 2016-12-17 NOTE — Therapy (Signed)
[place], [place], [address] Phone: [phone]   Fax:  [phone]  Physical Therapy Treatment  Patient Details  Name: Glenda Nolan MRN: [id number] Date of Birth: Mar 30, 1935 Referring Provider: Delaine Lame, MD   Encounter Date: 12/17/2016      PT End of Session - 12/17/16 1059    Visit Number 2   Number of Visits 8   Date for PT Re-Evaluation 01/06/17   Authorization Type U[employer] Medicare    Authorization Time Period 12/09/16-01/09/17   Authorization - Visit Number 2   Authorization - Number of Visits 10   PT Start Time 1031   PT Stop Time 1110   PT Time Calculation (min) 39 min   Equipment Utilized During Treatment Gait belt;Other (comment)   Activity Tolerance Patient tolerated treatment well;Patient limited by fatigue;No increased pain   Behavior During Therapy WFL for tasks assessed/performed      Past Medical History:  Diagnosis Date  . Arthritis   . Cataracts, bilateral    pending surgery May 2013  . Colon cancer 1994   Stage IIB  . Diverticulosis   . GERD (gastroesophageal reflux disease)   . HTN (hypertension)   . Hyperlipidemia   . Hypothyroidism   . Migraines   . Osteoporosis     Past Surgical History:  Procedure Laterality Date  . CATARACT EXTRACTION W/PHACO Left 08/01/2013   Procedure: CATARACT EXTRACTION PHACO AND INTRAOCULAR LENS PLACEMENT (IOC);  Surgeon: Tonny Branch, MD;  Location: [facility];  Service: Ophthalmology;  Laterality: Left;  CDE:11.47  . CATARACT EXTRACTION W/PHACO Right 08/15/2013   Procedure: CATARACT EXTRACTION PHACO AND INTRAOCULAR LENS PLACEMENT (IOC);  Surgeon: Tonny Branch, MD;  Location: [facility];  Service: Ophthalmology;  Laterality: Right;  CDE:  16.81  . COLON SURGERY  1994   Right  . COLONOSCOPY  04/01/2012   Rourk-Colonic ulcers at the anastomosis likely NSAID related, although ischemia is not excluded, status post biopsy, left-sided diverticula.  Internal  hemorrhoids and anal papilla  . COLONOSCOPY W/ BIOPSIES  05/04/09   Dr. Madolyn Frieze papilla, diverticulosis  . DILATION AND CURETTAGE OF UTERUS    . ESOPHAGOGASTRODUODENOSCOPY  04/01/2012   Rourk-Normal esophagus, small hiatal hernia, deformity, scarring friability of the antrum/prepyloric mucosa suggestive of prior peptic ulcer disease, normal D1 and D2.  . HEMORRHOID SURGERY    . knee arthrocopy(Right)      There were no vitals filed for this visit.      Subjective Assessment - 12/17/16 1034    Subjective Pt has little updates to report. She reprots shes feeling about the same as last visit. No significant pain. She arrives with her eldest daughter. She reports no falls since her last visit.    Pertinent History Pt reports she has been having problems with her left knee for a couple years but worse so lately, very sore. She says that her legs give out on her after walking long distances. Pt reports she has had about 4-5 falls in the last 3 months. Pt reports she has not hit the floor hard enough to warrant a 91 call, and she has gotten herself up independently as she lives alone.  She also has some falls related to navigating steps and curbs in the community.  PSH: Rt TKA ~2009.    Currently in Pain? No/denies  [facility] Adult PT Treatment/Exercise - 12/17/16 0001      Ambulation/Gait   Pre-Gait Activities Lateral side stepping: 2x64ft bilat  heavy cues required, verbal & tactile, to perform correctly     Exercises   Exercises Knee/Hip     Knee/Hip Exercises: Seated   Other Seated Knee/Hip Exercises seated hip ER with RedTB: 2x15  seated hip IR with RedTB: 2x10   Marching 10 reps   Marching Limitations 2x10, limited at 95 degrees  heavy cues to avoid trunk distortion   Sit to Sand 10 reps  2x10 with VC adbn tactile cues to avoid knee valgus, pillow      Knee/Hip Exercises: Supine   Bridges 2 sets;10 reps                PT  Education - 12/17/16 1059    Education provided Yes   Education Details pt will require assistance with HEP at home in future, especially for higher level and balance items.   Person(s) Educated Child(ren)   Methods Explanation   Comprehension Verbalized understanding          PT Short Term Goals - 12/09/16 1422      PT SHORT TERM GOAL #1   Title After 4 weeks patient will demonstrate ability to tolerate 6MWT with RW with overall gait speed >0.11m/s in order to improve safe access to the community.    Baseline 4 minutes only at eval, avg gait speed: 0.69m/s    Status New     PT SHORT TERM GOAL #2   Title After 4 weeks patient will demonstrate improved safety for home mobility AEB TUG:<14 seconds.    Baseline Tug: 23s.      PT SHORT TERM GOAL #3   Title After 4 weeks patient will demonstrate improved funcitonal strength AEB 5xSTS:<15s.    Status New     PT SHORT TERM GOAL #4   Title After 4 weeks patient will demonstrate decreased falls risk AEB BBT score >42 to improve safety at home.    Status New                  Plan - 12/17/16 1100    Clinical Impression Statement Pt tolerating session well today, she is limited by fatgiue in many exercises. She has some difficulty with coordinating movement adn following instructions, but eventualyl does get it. She has trouble performing the sitting hip flexion the most, with weakness adn range restrictions on the Right hip. HEP is reviewed with daughter as it is taght to patient.    Rehab Potential Fair   PT Frequency 2x / week   PT Duration 4 weeks   PT Treatment/Interventions ADLs/Self Care Home Management;Therapeutic activities;Therapeutic exercise;Dry needling;Balance training;Gait training;Stair training;Energy conservation;Passive range of motion;Patient/family education;Orthotic Fit/Training;Cryotherapy;Electrical Stimulation;Neuromuscular re-education   PT Next Visit Plan Berg Balance Test, trial prone right SLR, and  bilat hip rotation strengthening, and bilat hip abduction strengthening. begin multiplanar gait training   PT Home Exercise Plan 12/17/16: seated LAQ, spine bridges.    Consulted and Agree with Plan of Care Family member/caregiver;Patient   Family Member Consulted [name] (eldest daughter)       Patient will benefit from skilled therapeutic intervention in order to improve the following deficits and impairments:  Abnormal gait, Decreased range of motion, Difficulty walking, Cardiopulmonary status limiting activity, Decreased mobility, Decreased strength, Postural dysfunction, Improper body mechanics, Hypermobility, Decreased knowledge of use of DME, Decreased safety awareness, Decreased activity tolerance, Decreased balance  Visit Diagnosis: Repeated  falls  Difficulty in walking, not elsewhere classified  Muscle weakness (generalized)     Problem List Patient Active Problem List   Diagnosis Date Noted  . Heme positive stool 03/22/2012  . Melena 03/22/2012  . GERD (gastroesophageal reflux disease) 03/22/2012  . CARCINOMA, COLON 04/26/2009  . MIGRAINE HEADACHE 04/26/2009  . HYPERTENSION 04/26/2009  . DEGENERATIVE JOINT DISEASE 04/26/2009  . IRON DEFICIENCY ANEMIA, HX OF 04/26/2009  . KNEE, ARTHRITIS, DEGEN./OSTEO 03/01/2009  . KNEE PAIN 03/01/2009   11:14 AM, 12/17/16 Etta Grandchild, PT, DPT Physical Therapist at [facility] [phone] (office)     [facility] [address] [place], [place], [address] Phone: [phone]   Fax:  [phone]  Name: MARYRUTH AMIEL MRN: [id number] Date of Birth: [birth year]

## 2016-12-19 ENCOUNTER — Ambulatory Visit (HOSPITAL_COMMUNITY): Payer: Medicare Other

## 2016-12-19 ENCOUNTER — Encounter (HOSPITAL_COMMUNITY): Payer: Self-pay

## 2016-12-19 DIAGNOSIS — R262 Difficulty in walking, not elsewhere classified: Secondary | ICD-10-CM | POA: Diagnosis not present

## 2016-12-19 DIAGNOSIS — M6281 Muscle weakness (generalized): Secondary | ICD-10-CM | POA: Diagnosis not present

## 2016-12-19 DIAGNOSIS — R296 Repeated falls: Secondary | ICD-10-CM | POA: Diagnosis not present

## 2016-12-19 NOTE — Therapy (Signed)
[place], [place], [address] Phone: [phone]   Fax:  [phone]  Physical Therapy Treatment  Patient Details  Name: Glenda Nolan MRN: [id number] Date of Birth: [birth year] Referring Provider: Delaine Lame, MD   Encounter Date: 12/19/2016      PT End of Session - 12/19/16 1004    Visit Number 3   Number of Visits 8   Date for PT Re-Evaluation 01/06/17   Authorization Type UHC Medicare    Authorization Time Period 12/09/16-01/09/17   Authorization - Visit Number 3   Authorization - Number of Visits 10   PT Start Time 1000   PT Stop Time 1031   PT Time Calculation (min) 31 min   Equipment Utilized During Treatment Gait belt;Other (comment)  Pt wearing brace BLE; limits ability to complete NBOS   Activity Tolerance Patient tolerated treatment well;No increased pain;Patient limited by fatigue   Behavior During Therapy [facility] for tasks assessed/performed      Past Medical History:  Diagnosis Date  . Arthritis   . Cataracts, bilateral    pending surgery May 2013  . Colon cancer 1994   Stage IIB  . Diverticulosis   . GERD (gastroesophageal reflux disease)   . HTN (hypertension)   . Hyperlipidemia   . Hypothyroidism   . Migraines   . Osteoporosis     Past Surgical History:  Procedure Laterality Date  . CATARACT EXTRACTION W/PHACO Left 08/01/2013   Procedure: CATARACT EXTRACTION PHACO AND INTRAOCULAR LENS PLACEMENT (IOC);  Surgeon: Tonny Branch, MD;  Location: [facility];  Service: Ophthalmology;  Laterality: Left;  CDE:11.47  . CATARACT EXTRACTION W/PHACO Right 08/15/2013   Procedure: CATARACT EXTRACTION PHACO AND INTRAOCULAR LENS PLACEMENT (IOC);  Surgeon: Tonny Branch, MD;  Location: [facility];  Service: Ophthalmology;  Laterality: Right;  CDE:  16.81  . COLON SURGERY  1994   Right  . COLONOSCOPY  04/01/2012   Rourk-Colonic ulcers at the anastomosis likely NSAID related, although ischemia is not excluded, status  post biopsy, left-sided diverticula.  Internal hemorrhoids and anal papilla  . COLONOSCOPY W/ BIOPSIES  05/04/09   Dr. Madolyn Frieze papilla, diverticulosis  . DILATION AND CURETTAGE OF UTERUS    . ESOPHAGOGASTRODUODENOSCOPY  04/01/2012   Rourk-Normal esophagus, small hiatal hernia, deformity, scarring friability of the antrum/prepyloric mucosa suggestive of prior peptic ulcer disease, normal D1 and D2.  . HEMORRHOID SURGERY    . knee arthrocopy(Right)      There were no vitals filed for this visit.      Subjective Assessment - 12/19/16 1003    Subjective Pt stated she is feeling good today.  No reports of falls, did stumple getting into car earlier today.  Reports complaince wiht new exercises.   Pertinent History Pt reports she has been having problems with her left knee for a couple years but worse so lately, very sore. She says that her legs give out on her after walking long distances. Pt reports she has had about 4-5 falls in the last 3 months. Pt reports she has not hit the floor hard enough to warrant a 91 call, and she has gotten herself up independently as she lives alone.  She also has some falls related to navigating steps and curbs in the community.  PSH: Rt TKA ~2009.    Patient Stated Goals Improve her Left nee instability and decrease falls   Currently in Pain? No/denies  [facility] PT Assessment - 12/19/16 0001      Standardized Balance Assessment   Standardized Balance Assessment Berg Balance Test     Berg Balance Test   Sit to Stand Able to stand without using hands and stabilize independently   Standing Unsupported Able to stand safely 2 minutes   Sitting with Back Unsupported but Feet Supported on Floor or Stool Able to sit safely and securely 2 minutes   Stand to Sit Controls descent by using hands  decreased eccentric control with stand to sit   Transfers Able to transfer safely, minor use of hands   Standing Unsupported with Eyes Closed Able to stand 10  seconds safely   Standing Ubsupported with Feet Together Able to place feet together independently and stand for 1 minute with supervision  brace limits ability to stand NBOS, complete as narrow as ab   From Standing, Reach Forward with Outstretched Arm Can reach forward >12 cm safely (5")   From Standing Position, Pick up Object from Floor Able to pick up shoe safely and easily   From Standing Position, Turn to Look Behind Over each Shoulder Looks behind one side only/other side shows less weight shift   Turn 360 Degrees Able to turn 360 degrees safely but slowly  12" turns   Standing Unsupported, Alternately Place Feet on Step/Stool Able to stand independently and safely and complete 8 steps in 20 seconds  12.78" with SBA   Standing Unsupported, One Foot in Front Needs help to step but can hold 15 seconds  increased difficutly with Lt LE weight bearing   Standing on One Leg Unable to try or needs assist to prevent fall  1" max Bil LE   Total Score 43                     [facility] Adult PT Treatment/Exercise - 12/19/16 0001      Knee/Hip Exercises: Sidelying   Clams 10x BLE with cueing for form and technique     Knee/Hip Exercises: Prone   Hip Extension 2 sets;5 reps   Hip Extension Limitations complete with knee flexed and instructed "take bottom of foot towards ceiling"      Clam 10x BLE with therapist facilitation for proper form and technqiue             PT Short Term Goals - 12/09/16 1422      PT SHORT TERM GOAL #1   Title After 4 weeks patient will demonstrate ability to tolerate 6MWT with RW with overall gait speed >0.48m/s in order to improve safe access to the community.    Baseline 4 minutes only at eval, avg gait speed: 0.57m/s    Status New     PT SHORT TERM GOAL #2   Title After 4 weeks patient will demonstrate improved safety for home mobility AEB TUG:<14 seconds.    Baseline Tug: 23s.      PT SHORT TERM GOAL #3   Title After 4 weeks patient  will demonstrate improved funcitonal strength AEB 5xSTS:<15s.    Status New     PT SHORT TERM GOAL #4   Title After 4 weeks patient will demonstrate decreased falls risk AEB BBT score >42 to improve safety at home.    Status New                  Plan - 12/19/16 1039    Clinical Impression Statement Pt late for apt today.  BERG balance testing complete  with score 43/56, pt educated on importance of continue to ambulate with AD to reduce risk of falls.  Began proximal strengthening exercises with improve hip extension and rotation, therapist facilitation for proper form and technqiue wiht new exercises.  Prone SLR complete with knee flexion due to weakness.  No reports of pain through session, was limited by fatigue at EOS.     Rehab Potential Fair   PT Frequency 2x / week   PT Duration 4 weeks   PT Treatment/Interventions ADLs/Self Care Home Management;Therapeutic activities;Therapeutic exercise;Dry needling;Balance training;Gait training;Stair training;Energy conservation;Passive range of motion;Patient/family education;Orthotic Fit/Training;Cryotherapy;Electrical Stimulation;Neuromuscular re-education   PT Next Visit Plan Continue wiht proximal strengtheing including prone right SLR (complete with knee flexion this session due to weakness), and bilat hip rotation strengthening, and bilat hip abduction strengthening. begin multiplanar gait training      Patient will benefit from skilled therapeutic intervention in order to improve the following deficits and impairments:  Abnormal gait, Decreased range of motion, Difficulty walking, Cardiopulmonary status limiting activity, Decreased mobility, Decreased strength, Postural dysfunction, Improper body mechanics, Hypermobility, Decreased knowledge of use of DME, Decreased safety awareness, Decreased activity tolerance, Decreased balance  Visit Diagnosis: Repeated falls  Difficulty in walking, not elsewhere classified  Muscle weakness  (generalized)     Problem List Patient Active Problem List   Diagnosis Date Noted  . Heme positive stool 03/22/2012  . Melena 03/22/2012  . GERD (gastroesophageal reflux disease) 03/22/2012  . CARCINOMA, COLON 04/26/2009  . MIGRAINE HEADACHE 04/26/2009  . HYPERTENSION 04/26/2009  . DEGENERATIVE JOINT DISEASE 04/26/2009  . IRON DEFICIENCY ANEMIA, HX OF 04/26/2009  . KNEE, ARTHRITIS, DEGEN./OSTEO 03/01/2009  . KNEE PAIN 03/01/2009   Ihor Austin, [place]; [place]  Aldona Lento 12/19/2016, 10:45 AM  [place], [place], [address] Phone: [phone]   Fax:  [phone]  Name: ATASIA GLEAVES MRN: [id number] Date of Birth: 1935-09-13

## 2016-12-22 ENCOUNTER — Ambulatory Visit (HOSPITAL_COMMUNITY): Payer: Medicare Other | Admitting: Physical Therapy

## 2016-12-22 ENCOUNTER — Encounter (HOSPITAL_COMMUNITY): Payer: Self-pay | Admitting: Physical Therapy

## 2016-12-22 DIAGNOSIS — R262 Difficulty in walking, not elsewhere classified: Secondary | ICD-10-CM | POA: Diagnosis not present

## 2016-12-22 DIAGNOSIS — R296 Repeated falls: Secondary | ICD-10-CM | POA: Diagnosis not present

## 2016-12-22 DIAGNOSIS — M6281 Muscle weakness (generalized): Secondary | ICD-10-CM | POA: Diagnosis not present

## 2016-12-22 NOTE — Therapy (Signed)
[place], [place], [address] Phone: [phone]   Fax:  [phone]  Physical Therapy Treatment  Patient Details  Name: Glenda Nolan MRN: [id number] Date of Birth: 10/26/1935 Referring Provider: Delaine Lame, MD   Encounter Date: 12/22/2016      PT End of Session - 12/22/16 1402    Visit Number 4   Date for PT Re-Evaluation 01/06/17   Authorization Type UHC Medicare    Authorization Time Period 12/09/16-01/09/17   Authorization - Visit Number 4   Authorization - Number of Visits 10   PT Start Time 1350   PT Stop Time 1435   PT Time Calculation (min) 45 min   Equipment Utilized During Treatment Gait belt;Other (comment)   Activity Tolerance Patient tolerated treatment well;No increased pain;Patient limited by fatigue   Behavior During Therapy [facility] for tasks assessed/performed      Past Medical History:  Diagnosis Date  . Arthritis   . Cataracts, bilateral    pending surgery May 2013  . Colon cancer ([place]) 1994   Stage IIB  . Diverticulosis   . GERD (gastroesophageal reflux disease)   . HTN (hypertension)   . Hyperlipidemia   . Hypothyroidism   . Migraines   . Osteoporosis     Past Surgical History:  Procedure Laterality Date  . CATARACT EXTRACTION W/PHACO Left 08/01/2013   Procedure: CATARACT EXTRACTION PHACO AND INTRAOCULAR LENS PLACEMENT (IOC);  Surgeon: Tonny Branch, MD;  Location: [facility];  Service: Ophthalmology;  Laterality: Left;  CDE:11.47  . CATARACT EXTRACTION W/PHACO Right 08/15/2013   Procedure: CATARACT EXTRACTION PHACO AND INTRAOCULAR LENS PLACEMENT (IOC);  Surgeon: Tonny Branch, MD;  Location: [facility];  Service: Ophthalmology;  Laterality: Right;  CDE:  16.81  . COLON SURGERY  1994   Right  . COLONOSCOPY  04/01/2012   Rourk-Colonic ulcers at the anastomosis likely NSAID related, although ischemia is not excluded, status post biopsy, left-sided diverticula.  Internal hemorrhoids and anal  papilla  . COLONOSCOPY W/ BIOPSIES  05/04/09   Dr. Madolyn Frieze papilla, diverticulosis  . DILATION AND CURETTAGE OF UTERUS    . ESOPHAGOGASTRODUODENOSCOPY  04/01/2012   Rourk-Normal esophagus, small hiatal hernia, deformity, scarring friability of the antrum/prepyloric mucosa suggestive of prior peptic ulcer disease, normal D1 and D2.  . HEMORRHOID SURGERY    . knee arthrocopy(Right)      There were no vitals filed for this visit.      Subjective Assessment - 12/22/16 1359    Subjective Pt arriving to therapy with her daughter. Pt reporting no pain. Pt reporting no falls since last visit. Pt did however report near falls/ stumbles reporting left knee is giving way.  Pt reporting she feels like she has improved since beginning therapy. No questions about her HEP.    Pertinent History Pt reports she has been having problems with her left knee for a couple years but worse so lately, very sore. She says that her legs give out on her after walking long distances. Pt reports she has had about 4-5 falls in the last 3 months. Pt reports she has not hit the floor hard enough to warrant a 91 call, and she has gotten herself up independently as she lives alone.  She also has some falls related to navigating steps and curbs in the community.  PSH: Rt TKA ~2009.    How long can you sit comfortably? No limited, up to 2 hours.    How long can  you stand comfortably? Not limited by pain, but reports that it might buckle on her.    How long can you walk comfortably? Can tolerate limited community distances with UE support on cart.    Diagnostic tests None on the left   Patient Stated Goals Improve her Left nee instability and decrease falls   Currently in Pain? No/denies   Pain Onset More than a month ago   Multiple Pain Sites No                         [facility] Adult PT Treatment/Exercise - 12/22/16 0001      Ambulation/Gait   Ambulation Distance (Feet) 100 Feet   Assistive device Rolling  walker   Gait Pattern Step-through pattern;Left flexed knee in stance;Lateral hip instability;Poor foot clearance - left;Poor foot clearance - right     Exercises   Exercises Knee/Hip     Knee/Hip Exercises: Standing   Other Standing Knee Exercises Sit to stand with ball between the knees x 5, sit to stand using red theraband for hip abduction x 5. sit to stand using no UE support x 5     Knee/Hip Exercises: Seated   Clamshell with TheraBand Red  10 reps, 2 sets     Knee/Hip Exercises: Supine   Short Arc Quad Sets 10 reps;2 sets   Bridges 10 reps;2 sets   Straight Leg Raises Strengthening;Both;10 reps     Knee/Hip Exercises: Sidelying   Hip ABduction 2 sets;10 reps  cues for technique and form   Clams 10x BLE with cueing for form and technique     Knee/Hip Exercises: Prone   Hip Extension 10 reps;2 sets   Hip Extension Limitations complete with knee flexed and instructed "take bottom of foot towards ceiling"   Straight Leg Raises 5 reps                  PT Short Term Goals - 12/22/16 1436      PT SHORT TERM GOAL #1   Title After 4 weeks patient will demonstrate ability to tolerate 6MWT with RW with overall gait speed >0.2m/s in order to improve safe access to the community.    Baseline 4 minutes only at eval, avg gait speed: 0.53m/s    Status New     PT SHORT TERM GOAL #2   Title After 4 weeks patient will demonstrate improved safety for home mobility AEB TUG:<14 seconds.    Baseline Tug: 23s.    Time 4   Period Weeks   Status New     PT SHORT TERM GOAL #3   Title After 4 weeks patient will demonstrate improved funcitonal strength AEB 5xSTS:<15s.    Time 4   Period Weeks   Status New     PT SHORT TERM GOAL #4   Title After 4 weeks patient will demonstrate decreased falls risk AEB BBT score >42 to improve safety at home.    Time 4   Period Weeks   Status New                  Plan - 12/22/16 1409    Clinical Impression Statement Pt  arriving to therapy late today. Pt with her daughter. Pt reporting no pain at rest. Pt reporting near falls at home due to her left knee buckling/giving way. Pt wearing bilateral knee support braces. Pt amb with bilateral genu valgus L>R. Pt requiring verbal instructions for all exercises to maintain  proper technique. Continue with skilled Pt to progress pt's functional mobility.    Rehab Potential Fair   PT Frequency 2x / week   PT Duration 4 weeks   PT Treatment/Interventions ADLs/Self Care Home Management;Therapeutic activities;Therapeutic exercise;Dry needling;Balance training;Gait training;Stair training;Energy conservation;Passive range of motion;Patient/family education;Orthotic Fit/Training;Cryotherapy;Electrical Stimulation;Neuromuscular re-education   PT Next Visit Plan Continue wiht proximal strengtheing including prone right SLR (complete with knee flexion this session due to weakness), and bilat hip rotation strengthening, and bilat hip abduction strengthening. begin multiplanar gait training   PT Home Exercise Plan 12/17/16: seated LAQ, spine bridges.    Consulted and Agree with Plan of Care Family member/caregiver   Family Member Consulted Caren Griffins Ann-daughter      Patient will benefit from skilled therapeutic intervention in order to improve the following deficits and impairments:  Abnormal gait, Decreased range of motion, Difficulty walking, Cardiopulmonary status limiting activity, Decreased mobility, Decreased strength, Postural dysfunction, Improper body mechanics, Hypermobility, Decreased knowledge of use of DME, Decreased safety awareness, Decreased activity tolerance, Decreased balance  Visit Diagnosis: Repeated falls  Difficulty in walking, not elsewhere classified  Muscle weakness (generalized)     Problem List Patient Active Problem List   Diagnosis Date Noted  . Heme positive stool 03/22/2012  . Melena 03/22/2012  . GERD (gastroesophageal reflux disease)  03/22/2012  . CARCINOMA, COLON 04/26/2009  . MIGRAINE HEADACHE 04/26/2009  . HYPERTENSION 04/26/2009  . DEGENERATIVE JOINT DISEASE 04/26/2009  . IRON DEFICIENCY ANEMIA, HX OF 04/26/2009  . KNEE, ARTHRITIS, DEGEN./OSTEO 03/01/2009  . KNEE PAIN 03/01/2009    Oretha Caprice, MPT 12/22/2016, 3:01 PM  [facility] [address] [place], [place], [address] Phone: [phone]   Fax:  [phone]  Name: KIHANNA BRUCKNER MRN: [id number] Date of Birth: 08/12/1935

## 2016-12-24 ENCOUNTER — Ambulatory Visit (HOSPITAL_COMMUNITY): Payer: Medicare Other

## 2016-12-24 DIAGNOSIS — M6281 Muscle weakness (generalized): Secondary | ICD-10-CM

## 2016-12-24 DIAGNOSIS — R296 Repeated falls: Secondary | ICD-10-CM | POA: Diagnosis not present

## 2016-12-24 DIAGNOSIS — R262 Difficulty in walking, not elsewhere classified: Secondary | ICD-10-CM

## 2016-12-24 NOTE — Therapy (Addendum)
PHYSICAL THERAPY DISCHARGE SUMMARY  Visits from Start of Care: 5  Current functional level related to goals / functional outcomes: *see below   Remaining deficits: *see below   Education / Equipment: *see below Plan: Patient agrees to discharge.  Patient goals were not met. Patient is being discharged due to the patient's request.  ?????         Pt reports abdominal pain in the suprapubic region since her 4th visit. No abdominal strengthening noted in that session. PT palpated in session 5,unable to recreate pain, and pain the same regardless of trunk flexion activities. This pain is not consistent with musculoskeletal pain per this author. PT encouraged pt to review with PCP due to history of ABD surgery.   12:59 PM, 01/02/17 Etta Grandchild, PT, DPT Physical Therapist at [facility] [phone] (office)           [facility] [address] [place], [place], [address] Phone: [phone]   Fax:  [phone]  Physical Therapy Treatment  Patient Details  Name: LENISHA LACAP MRN: [id number] Date of Birth: 03/02/1935 Referring Provider: Delaine Lame, MD   Encounter Date: 12/24/2016      PT End of Session - 12/24/16 1109    Visit Number 5   Number of Visits 8   Date for PT Re-Evaluation 01/06/17   Authorization Type [employer] Medicare    Authorization Time Period 12/09/16-01/09/17   Authorization - Visit Number 5   Authorization - Number of Visits 10   PT Start Time 6195   PT Stop Time 1113   PT Time Calculation (min) 38 min   Activity Tolerance Patient tolerated treatment well;No increased pain;Patient limited by fatigue   Behavior During Therapy [facility] for tasks assessed/performed      Past Medical History:  Diagnosis Date  . Arthritis   . Cataracts, bilateral    pending surgery May 2013  . Colon cancer ([place]) 1994   Stage IIB  . Diverticulosis   . GERD (gastroesophageal reflux disease)    . HTN (hypertension)   . Hyperlipidemia   . Hypothyroidism   . Migraines   . Osteoporosis     Past Surgical History:  Procedure Laterality Date  . CATARACT EXTRACTION W/PHACO Left 08/01/2013   Procedure: CATARACT EXTRACTION PHACO AND INTRAOCULAR LENS PLACEMENT (IOC);  Surgeon: Tonny Branch, MD;  Location: [facility];  Service: Ophthalmology;  Laterality: Left;  CDE:11.47  . CATARACT EXTRACTION W/PHACO Right 08/15/2013   Procedure: CATARACT EXTRACTION PHACO AND INTRAOCULAR LENS PLACEMENT (IOC);  Surgeon: Tonny Branch, MD;  Location: [facility];  Service: Ophthalmology;  Laterality: Right;  CDE:  16.81  . COLON SURGERY  1994   Right  . COLONOSCOPY  04/01/2012   Rourk-Colonic ulcers at the anastomosis likely NSAID related, although ischemia is not excluded, status post biopsy, left-sided diverticula.  Internal hemorrhoids and anal papilla  . COLONOSCOPY W/ BIOPSIES  05/04/09   Dr. Madolyn Frieze papilla, diverticulosis  . DILATION AND CURETTAGE OF UTERUS    . ESOPHAGOGASTRODUODENOSCOPY  04/01/2012   Rourk-Normal esophagus, small hiatal hernia, deformity, scarring friability of the antrum/prepyloric mucosa suggestive of prior peptic ulcer disease, normal D1 and D2.  . HEMORRHOID SURGERY    . knee arthrocopy(Right)      There were no vitals filed for this visit.      Subjective Assessment - 12/24/16 1046    Subjective Pt reports deep abdominal pain since Monday's session. Unabel to describe what it  is, except that it feels related to her surgical scar from 20YA.    Pertinent History Pt reports she has been having problems with her left knee for a couple years but worse so lately, very sore. She says that her legs give out on her after walking long distances. Pt reports she has had about 4-5 falls in the last 3 months. Pt reports she has not hit the floor hard enough to warrant a 91 call, and she has gotten herself up independently as she lives alone.  She also has some falls related to navigating steps and  curbs in the community.  PSH: Rt TKA ~2009.    Currently in Pain? Yes  Pt does nto rate numberically, just some soreness.                          [facility] Adult PT Treatment/Exercise - 12/24/16 0001      Knee/Hip Exercises: Standing   Hip Extension Knee bent;Knee straight;10 reps;Both  modified plantigrade   Other Standing Knee Exercises 3x10 c redTB     Knee/Hip Exercises: Seated   Clamshell with TheraBand Red  15 reps, 2 sets     Knee/Hip Exercises: Supine   Short Arc Quad Sets 15 reps;Other (comment);Both;3 sets  5lb on Rt; AA/ROM on Lt   Short [employer] Limitations Left sided pain and weakness   Bridges 10 reps;2 sets   Straight Leg Raises Strengthening;Left                  PT Short Term Goals - 12/22/16 1436      PT SHORT TERM GOAL #1   Title After 4 weeks patient will demonstrate ability to tolerate 6MWT with RW with overall gait speed >0.39ms in order to improve safe access to the community.    Baseline 4 minutes only at eval, avg gait speed: 0.524m    Status New     PT SHORT TERM GOAL #2   Title After 4 weeks patient will demonstrate improved safety for home mobility AEB TUG:<14 seconds.    Baseline Tug: 23s.    Time 4   Period Weeks   Status New     PT SHORT TERM GOAL #3   Title After 4 weeks patient will demonstrate improved funcitonal strength AEB 5xSTS:<15s.    Time 4   Period Weeks   Status New     PT SHORT TERM GOAL #4   Title After 4 weeks patient will demonstrate decreased falls risk AEB BBT score >42 to improve safety at home.    Time 4   Period Weeks   Status New                  Plan - 12/24/16 1110    Clinical Impression Statement Pt c/o deep abdominal pain today at arrival, palpated to be central to the ABD minline and 4 inches distal the umbilicus. Plapated during a trunk flexion activity, pt denies related to superfiscial abdominal pain and denies worse with pain. Pt denies dysuria, pyuria, etc. Pt  denies history of abdominal pain similar to this. Pt/daughter encouraged to FU with PCP regarding this issue. Pt tolerating sessionw ell today. Following cues well with tactile and verbal instruction for form. SAQ ar emost painful provoking and notably weak when weight is added. In stance Rt sided hip extension appears to be more limited/weak with noted tightness in the rectus femoris. No changes to POC at this  time. Pt looking into purchasing a new RW today after session.    Rehab Potential Fair   PT Frequency 2x / week   PT Duration 4 weeks   PT Treatment/Interventions ADLs/Self Care Home Management;Therapeutic activities;Therapeutic exercise;Dry needling;Balance training;Gait training;Stair training;Energy conservation;Passive range of motion;Patient/family education;Orthotic Fit/Training;Cryotherapy;Electrical Stimulation;Neuromuscular re-education   PT Next Visit Plan Continue with proximal strengtheing including prone right SLR (complete with knee flexion this session due to weakness), and bilat hip rotation strengthening, and bilat hip abduction strengthening. begin multiplanar gait training; consider adding Prone SLR to home.    PT Home Exercise Plan 12/17/16: seated LAQ, supine bridges.    Consulted and Agree with Plan of Care Family member/caregiver   Family Member Consulted daughter      Patient will benefit from skilled therapeutic intervention in order to improve the following deficits and impairments:  Abnormal gait, Decreased range of motion, Difficulty walking, Cardiopulmonary status limiting activity, Decreased mobility, Decreased strength, Postural dysfunction, Improper body mechanics, Hypermobility, Decreased knowledge of use of DME, Decreased safety awareness, Decreased activity tolerance, Decreased balance  Visit Diagnosis: Repeated falls  Difficulty in walking, not elsewhere classified  Muscle weakness (generalized)     Problem List Patient Active Problem List    Diagnosis Date Noted  . Heme positive stool 03/22/2012  . Melena 03/22/2012  . GERD (gastroesophageal reflux disease) 03/22/2012  . CARCINOMA, COLON 04/26/2009  . MIGRAINE HEADACHE 04/26/2009  . HYPERTENSION 04/26/2009  . DEGENERATIVE JOINT DISEASE 04/26/2009  . IRON DEFICIENCY ANEMIA, HX OF 04/26/2009  . KNEE, ARTHRITIS, DEGEN./OSTEO 03/01/2009  . KNEE PAIN 03/01/2009   11:20 AM, 12/24/16 Etta Grandchild, PT, DPT Physical Therapist at [facility] [phone] (office)     [facility] [address] [place], [place], [address] Phone: [phone]   Fax:  [phone]  Name: BRAYLIE BADAMI MRN: [id number] Date of Birth: 04-26-1935

## 2016-12-29 ENCOUNTER — Telehealth (HOSPITAL_COMMUNITY): Payer: Self-pay | Admitting: Internal Medicine

## 2016-12-29 ENCOUNTER — Telehealth (HOSPITAL_COMMUNITY): Payer: Self-pay | Admitting: Physical Therapy

## 2016-12-29 ENCOUNTER — Ambulatory Visit (HOSPITAL_COMMUNITY): Payer: Medicare Other | Admitting: Physical Therapy

## 2016-12-29 DIAGNOSIS — M810 Age-related osteoporosis without current pathological fracture: Secondary | ICD-10-CM | POA: Diagnosis not present

## 2016-12-29 DIAGNOSIS — R296 Repeated falls: Secondary | ICD-10-CM | POA: Diagnosis not present

## 2016-12-29 NOTE — Telephone Encounter (Signed)
12/29/16 a messge was left that her appt today would need to be cancelled.  I didn't listen to the message until 11:00am

## 2016-12-29 NOTE — Telephone Encounter (Signed)
Patient a no-show today. Called and spoke to patient, who states she is really not feeling good with ongoing pain at "the bottom of my stomach".   Asked patient to please call us if she is still not feeling good/will not make it to our appointment on 2/7; patient agreeable at this time.  Deniece Ree PT, DPT [phone]

## 2016-12-30 DIAGNOSIS — M25562 Pain in left knee: Secondary | ICD-10-CM | POA: Diagnosis not present

## 2016-12-30 DIAGNOSIS — R109 Unspecified abdominal pain: Secondary | ICD-10-CM | POA: Diagnosis not present

## 2016-12-30 DIAGNOSIS — M810 Age-related osteoporosis without current pathological fracture: Secondary | ICD-10-CM | POA: Diagnosis not present

## 2016-12-30 DIAGNOSIS — R296 Repeated falls: Secondary | ICD-10-CM | POA: Diagnosis not present

## 2016-12-31 ENCOUNTER — Other Ambulatory Visit (HOSPITAL_COMMUNITY): Payer: Self-pay | Admitting: Internal Medicine

## 2016-12-31 ENCOUNTER — Telehealth (HOSPITAL_COMMUNITY): Payer: Self-pay | Admitting: Internal Medicine

## 2016-12-31 ENCOUNTER — Telehealth (HOSPITAL_COMMUNITY): Payer: Self-pay

## 2016-12-31 ENCOUNTER — Ambulatory Visit (HOSPITAL_COMMUNITY): Payer: Medicare Other

## 2016-12-31 DIAGNOSIS — Z1231 Encounter for screening mammogram for malignant neoplasm of breast: Secondary | ICD-10-CM

## 2016-12-31 NOTE — Telephone Encounter (Signed)
12/31/16  Glenda Nolan came to the front desk and told me that patient wouldn't be here today... She is still sick

## 2016-12-31 NOTE — Telephone Encounter (Signed)
12/31/16 daughter called and said that she wouldn't be coming back to therapy.... When she was here last she pulled a muscle in her abdomen.

## 2017-01-14 ENCOUNTER — Ambulatory Visit (INDEPENDENT_AMBULATORY_CARE_PROVIDER_SITE_OTHER): Payer: Medicare Other | Admitting: Orthopaedic Surgery

## 2017-01-14 ENCOUNTER — Ambulatory Visit (HOSPITAL_COMMUNITY)
Admission: RE | Admit: 2017-01-14 | Discharge: 2017-01-14 | Disposition: A | Discharge status: Home / Self Care | Payer: Medicare Other | Source: Ambulatory Visit | Attending: Orthopaedic Surgery | Admitting: Orthopaedic Surgery

## 2017-01-14 ENCOUNTER — Encounter: Payer: Self-pay | Admitting: Orthopaedic Surgery

## 2017-01-14 VITALS — BP 174/102 | HR 120 | Temp 97.3°F | Ht 61.0 in | Wt 139.0 lb

## 2017-01-14 DIAGNOSIS — M25562 Pain in left knee: Secondary | ICD-10-CM | POA: Insufficient documentation

## 2017-01-14 DIAGNOSIS — G8929 Other chronic pain: Secondary | ICD-10-CM | POA: Diagnosis not present

## 2017-01-14 DIAGNOSIS — M1712 Unilateral primary osteoarthritis, left knee: Secondary | ICD-10-CM | POA: Insufficient documentation

## 2017-01-14 NOTE — Progress Notes (Signed)
Subjective:    Patient ID: Glenda Nolan, female    DOB: January 27, 1935, 82 y.o.   MRN: [id number]  HPI She has a long history of pain of the left knee getting worse over the last few months.  She has popping, swelling and giving way.  She has pain now at rest. It awakens her sometimes with left knee pain.  She has no trauma, no redness.  She has tried ice, heat, rubs, Advil, rest with little help. She uses a cane.  She has had PT.  Dr. Wende Nolan is her family doctor and I have reviewed notes from him.  She is considering a total knee replacement.  X-rays show bone on bone laterally with severe degenerative changes present.  Review of Systems  HENT: Negative for congestion.   Respiratory: Negative for cough and shortness of breath.   Cardiovascular: Negative for chest pain and leg swelling.  Endocrine: Positive for cold intolerance.  Musculoskeletal: Positive for arthralgias, gait problem and joint swelling.  Allergic/Immunologic: Positive for environmental allergies.  Neurological: Positive for headaches.   Past Medical History:  Diagnosis Date  . Arthritis   . Cataracts, bilateral    pending surgery May 2013  . Colon cancer ([place]) 1994   Stage IIB  . Diverticulosis   . GERD (gastroesophageal reflux disease)   . HTN (hypertension)   . Hyperlipidemia   . Hypothyroidism   . Migraines   . Osteoporosis     Past Surgical History:  Procedure Laterality Date  . CATARACT EXTRACTION W/PHACO Left 08/01/2013   Procedure: CATARACT EXTRACTION PHACO AND INTRAOCULAR LENS PLACEMENT (IOC);  Surgeon: Glenda Branch, MD;  Location: [facility];  Service: Ophthalmology;  Laterality: Left;  CDE:11.47  . CATARACT EXTRACTION W/PHACO Right 08/15/2013   Procedure: CATARACT EXTRACTION PHACO AND INTRAOCULAR LENS PLACEMENT (IOC);  Surgeon: Glenda Branch, MD;  Location: [facility];  Service: Ophthalmology;  Laterality: Right;  CDE:  16.81  . COLON SURGERY  1994   Right  . COLONOSCOPY  04/01/2012   Glenda Nolan-Colonic ulcers at  the anastomosis likely NSAID related, although ischemia is not excluded, status post biopsy, left-sided diverticula.  Internal hemorrhoids and anal papilla  . COLONOSCOPY W/ BIOPSIES  05/04/09   Dr. Madolyn Nolan papilla, diverticulosis  . DILATION AND CURETTAGE OF UTERUS    . ESOPHAGOGASTRODUODENOSCOPY  04/01/2012   Glenda Nolan-Normal esophagus, small hiatal hernia, deformity, scarring friability of the antrum/prepyloric mucosa suggestive of prior peptic ulcer disease, normal D1 and D2.  . HEMORRHOID SURGERY    . knee arthrocopy(Right)      Current Outpatient Prescriptions on File Prior to Visit  Medication Sig Dispense Refill  . aspirin 81 MG chewable tablet Chew 81 mg by mouth daily.    Marland Kitchen BENICAR 40 MG tablet Take 40 mg by mouth daily.     . cholecalciferol (VITAMIN D) 1000 UNITS tablet Take 1,000 Units by mouth daily.    . IRON PO Take 1 tablet by mouth daily.    Marland Kitchen levothyroxine (SYNTHROID, LEVOTHROID) 50 MCG tablet Take 50 mcg by mouth daily.    . Multiple Vitamins-Minerals (PX SENIOR VITAMIN PO) Take 1 tablet by mouth daily.     . nebivolol (BYSTOLIC) 5 MG tablet Take 5 mg by mouth daily.    Marland Kitchen omeprazole (PRILOSEC) 20 MG capsule Take 20 mg by mouth daily.    . raloxifene (EVISTA) 60 MG tablet Take 60 mg by mouth daily.    . rosuvastatin (CRESTOR) 10 MG tablet Take 10 mg by mouth at  bedtime.     . verapamil (CALAN-SR) 240 MG CR tablet Take 240 mg by mouth daily.     . vitamin C (ASCORBIC ACID) 500 MG tablet Take 500 mg by mouth daily.    . vitamin E (VITAMIN E) 400 UNIT capsule Take 400 Units by mouth daily.     No current facility-administered medications on file prior to visit.     Social History   Social History  . Marital status: Divorced    Spouse name: N/A  . Number of children: 5  . Years of education: N/A   Occupational History  . retired,Glenda Nolan Retired   Social History Main Topics  . Smoking status: Former Smoker    Packs/day: 0.50    Years: 10.00    Types:  Cigarettes    Quit date: 11/24/1992  . Smokeless tobacco: Former Systems developer    Quit date: 12/22/1992  . Alcohol use Yes     Comment: a glass of wine once a year  . Drug use: No  . Sexual activity: Yes    Birth control/ protection: Post-menopausal   Other Topics Concern  . Not on file   Social History Narrative   2 children deceased-accident, CVA, 5 living   Lives Glenda Nolan nearby    Family History  Problem Relation Age of Onset  . Leukemia Mother 64  . Hypertension Mother   . Heart failure Father 74  . Coronary artery disease Sister     x2  . Stroke Son     BP (!) 174/102   Pulse (!) 120   Temp 97.3 F (36.3 C)   Ht 5\' 1"  (1.549 m)   Wt 139 lb (63 kg)   BMI 26.26 kg/m      Objective:   Physical Exam  Constitutional: She is oriented to person, place, and time. She appears well-developed and well-nourished.  HENT:  Head: Normocephalic and atraumatic.  Eyes: Conjunctivae and EOM are normal. Pupils are equal, round, and reactive to light.  Neck: Normal range of motion. Neck supple.  Cardiovascular: Normal rate, regular rhythm and intact distal pulses.   Pulmonary/Chest: Effort normal.  Abdominal: Soft.  Musculoskeletal: She exhibits tenderness (Pain left knee more pain laterally, ROM 0 to 100 with crepitus, laxity medially.  No redness, effusion 2+ today.  Right knee with crepitus, ROM 0 to 105.).  Neurological: She is alert and oriented to person, place, and time. She displays normal reflexes. No cranial nerve deficit. She exhibits normal muscle tone. Coordination normal.  Skin: Skin is warm and dry.  Psychiatric: She has a normal mood and affect. Her behavior is normal. Judgment and thought content normal.  Vitals reviewed.  X-rays were done at the hospital today, reported separately.       Assessment & Plan:   Encounter Diagnosis  Name Primary?  . Chronic pain of left knee Yes   I have discussed with her about a total knee.  She wants to think about  it.  PROCEDURE NOTE:  The patient requests injections of the left knee , verbal consent was obtained.  The left knee was prepped appropriately after time out was performed.   Sterile technique was observed and injection of 1 cc of Depo-Medrol 40 mg with several cc's of plain xylocaine. Anesthesia was provided by ethyl chloride and a 20-gauge needle was used to inject the knee area. The injection was tolerated well.  A band aid dressing was applied.  The patient was advised to apply ice later  today and tomorrow to the injection sight as needed.  Return in one month.  Call if any problem  Precautions discussed.  If she would like to have a total knee, she can make appointment to see Dr. Aline Brochure.  Electronically Signed Sanjuana Kava, MD 2/21/201811:17 AM

## 2017-01-16 ENCOUNTER — Telehealth: Payer: Self-pay | Admitting: Orthopaedic Surgery

## 2017-01-16 NOTE — Telephone Encounter (Signed)
ADVISED ICE AND TYLENOL OR IBUPROFEN, CALL OFFICE IF NO BETTER NEXT WEEK

## 2017-01-16 NOTE — Telephone Encounter (Signed)
Patient's daughter left message on voicemail about her mom having a lot of pain in her left knee where she had her injection on 01/14/17. She is asking if there is anything that can be done to help with the pain. She is Dr. Brooke Bonito patient.  Please advise  [phone]

## 2017-02-05 ENCOUNTER — Ambulatory Visit (HOSPITAL_COMMUNITY)
Admission: RE | Admit: 2017-02-05 | Discharge: 2017-02-05 | Disposition: A | Discharge status: Home / Self Care | Payer: Medicare Other | Source: Ambulatory Visit | Attending: Internal Medicine | Admitting: Internal Medicine

## 2017-02-05 DIAGNOSIS — Z1231 Encounter for screening mammogram for malignant neoplasm of breast: Secondary | ICD-10-CM | POA: Diagnosis not present

## 2017-02-10 ENCOUNTER — Ambulatory Visit (INDEPENDENT_AMBULATORY_CARE_PROVIDER_SITE_OTHER): Payer: Medicare Other | Admitting: Orthopaedic Surgery

## 2017-02-10 VITALS — BP 170/102 | HR 91 | Temp 97.5°F | Ht 61.0 in | Wt 139.0 lb

## 2017-02-10 DIAGNOSIS — G8929 Other chronic pain: Secondary | ICD-10-CM

## 2017-02-10 DIAGNOSIS — M25562 Pain in left knee: Secondary | ICD-10-CM | POA: Diagnosis not present

## 2017-02-10 NOTE — Progress Notes (Signed)
CC:  I have pain of my left knee. I would like an injection.  The patient has chronic pain of the left knee.  There is no recent trauma.  There is no redness.  Injections in the past have helped.  The knee has no redness, has an effusion and crepitus present.  ROM of the left knee is 0-100.  Impression:  Chronic knee pain left  Return: Get MRI of the left knee.  She has giving way and I am concerned about a meniscus tear.  PROCEDURE NOTE:  The patient requests injections of the left knee, verbal consent was obtained.  The left knee was prepped appropriately after time out was performed.   Sterile technique was observed and injection of 1 cc of Depo-Medrol 40 mg with several cc's of plain xylocaine. Anesthesia was provided by ethyl chloride and a 20-gauge needle was used to inject the knee area. The injection was tolerated well.  A band aid dressing was applied.  The patient was advised to apply ice later today and tomorrow to the injection sight as needed.  Electronically Signed Sanjuana Kava, MD 3/20/201810:20 AM

## 2017-02-11 ENCOUNTER — Ambulatory Visit: Payer: Self-pay | Admitting: Orthopaedic Surgery

## 2017-02-20 ENCOUNTER — Ambulatory Visit (HOSPITAL_COMMUNITY)
Admission: RE | Admit: 2017-02-20 | Discharge: 2017-02-20 | Disposition: A | Discharge status: Home / Self Care | Payer: Medicare Other | Source: Ambulatory Visit | Attending: Orthopaedic Surgery | Admitting: Orthopaedic Surgery

## 2017-02-20 DIAGNOSIS — S83242A Other tear of medial meniscus, current injury, left knee, initial encounter: Secondary | ICD-10-CM | POA: Insufficient documentation

## 2017-02-20 DIAGNOSIS — M25562 Pain in left knee: Secondary | ICD-10-CM | POA: Diagnosis not present

## 2017-02-20 DIAGNOSIS — S8392XA Sprain of unspecified site of left knee, initial encounter: Secondary | ICD-10-CM | POA: Insufficient documentation

## 2017-02-20 DIAGNOSIS — G8929 Other chronic pain: Secondary | ICD-10-CM | POA: Diagnosis not present

## 2017-02-20 DIAGNOSIS — X58XXXA Exposure to other specified factors, initial encounter: Secondary | ICD-10-CM | POA: Diagnosis not present

## 2017-02-20 DIAGNOSIS — M94262 Chondromalacia, left knee: Secondary | ICD-10-CM | POA: Diagnosis not present

## 2017-02-24 ENCOUNTER — Ambulatory Visit (INDEPENDENT_AMBULATORY_CARE_PROVIDER_SITE_OTHER): Payer: Medicare Other | Admitting: Orthopaedic Surgery

## 2017-02-24 ENCOUNTER — Encounter: Payer: Self-pay | Admitting: Orthopaedic Surgery

## 2017-02-24 VITALS — BP 160/102 | HR 90 | Temp 97.4°F | Ht 64.0 in | Wt 134.0 lb

## 2017-02-24 DIAGNOSIS — G8929 Other chronic pain: Secondary | ICD-10-CM | POA: Diagnosis not present

## 2017-02-24 DIAGNOSIS — M25562 Pain in left knee: Secondary | ICD-10-CM | POA: Diagnosis not present

## 2017-02-24 NOTE — Addendum Note (Signed)
Addended by: Baldomero Lamy B on: 02/24/2017 02:35 PM   Modules accepted: Orders, SmartSet

## 2017-02-24 NOTE — Progress Notes (Signed)
Dr. Luna Glasgow has referred the patient to me for possible surgery on her left knee  She complains of pain and giving way  Her MRI and x-rays show fairly large valgus component to her left knee alignment with osteophytes cyst formation sclerosis and joint space narrowing primarily in the lateral compartment  We discussed scope versus replacement and her age and health it is a good time to go ahead and do the knee replacement now  This procedure has been fully reviewed with the patient and written informed consent has been obtained.   Left tka

## 2017-02-24 NOTE — Progress Notes (Signed)
Patient Glenda Nolan, female DOB:03-03-1935, 82 y.o. EZM:[id number]  No chief complaint on file.   HPI  Glenda Nolan is a 82 y.o. female who has pain and swelling and giving way of the left knee.  She is on a walker.  She had a MRI of the knee which showed: IMPRESSION: 1. Inferior articular surfacing tear of the posterior horn of the medial meniscus associated with mucoid degeneration. Macerated appearance of the anterior horn of the lateral meniscus mucoid degeneration of body and posterior horn. 2. Thin wispy appearance of the ACL likely representing sequela of partial tear with a few intact fibers present. Intact PCL. 3. Mild edema overlying the collateral ligaments with slight thickening more so medially consistent collateral ligament sprains. 4. 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 Associated mild reactive marrow edema.  I explained the findings to her and her daughter who is present.  I have told them she is a candidate for a total knee.  An arthroscopy can be done for a short time "fix".  She and her daughter are willing to talk to Dr. Aline Brochure about possible surgery. HPI  Body mass index is 23 kg/m.  ROS  Review of Systems  HENT: Negative for congestion.   Respiratory: Negative for cough and shortness of breath.   Cardiovascular: Negative for chest pain and leg swelling.  Endocrine: Positive for cold intolerance.  Musculoskeletal: Positive for arthralgias, gait problem and joint swelling.  Allergic/Immunologic: Positive for environmental allergies.  Neurological: Positive for headaches.    Past Medical History:  Diagnosis Date  . Arthritis   . Cataracts, bilateral    pending surgery May 2013  . Colon cancer ([place]) 1994   Stage IIB  . Diverticulosis   . GERD (gastroesophageal reflux disease)   . HTN (hypertension)   . Hyperlipidemia   . Hypothyroidism   . Migraines   . Osteoporosis     Past Surgical History:  Procedure  Laterality Date  . CATARACT EXTRACTION W/PHACO Left 08/01/2013   Procedure: CATARACT EXTRACTION PHACO AND INTRAOCULAR LENS PLACEMENT (IOC);  Surgeon: Tonny Branch, MD;  Location: [facility];  Service: Ophthalmology;  Laterality: Left;  CDE:11.47  . CATARACT EXTRACTION W/PHACO Right 08/15/2013   Procedure: CATARACT EXTRACTION PHACO AND INTRAOCULAR LENS PLACEMENT (IOC);  Surgeon: Tonny Branch, MD;  Location: [facility];  Service: Ophthalmology;  Laterality: Right;  CDE:  16.81  . COLON SURGERY  1994   Right  . COLONOSCOPY  04/01/2012   Rourk-Colonic ulcers at the anastomosis likely NSAID related, although ischemia is not excluded, status post biopsy, left-sided diverticula.  Internal hemorrhoids and anal papilla  . COLONOSCOPY W/ BIOPSIES  05/04/09   Dr. Madolyn Frieze papilla, diverticulosis  . DILATION AND CURETTAGE OF UTERUS    . ESOPHAGOGASTRODUODENOSCOPY  04/01/2012   Rourk-Normal esophagus, small hiatal hernia, deformity, scarring friability of the antrum/prepyloric mucosa suggestive of prior peptic ulcer disease, normal D1 and D2.  . HEMORRHOID SURGERY    . knee arthrocopy(Right)      Family History  Problem Relation Age of Onset  . Leukemia Mother 69  . Hypertension Mother   . Heart failure Father 23  . Coronary artery disease Sister     x2  . Stroke Son     Social History Social History  Substance Use Topics  . Smoking status: Former Smoker    Packs/day: 0.50    Years: 10.00    Types: Cigarettes    Quit date: 11/24/1992  . Smokeless tobacco: Former Systems developer  Quit date: 12/22/1992  . Alcohol use Yes     Comment: a glass of wine once a year    No Known Allergies  Current Outpatient Prescriptions  Medication Sig Dispense Refill  . aspirin 81 MG chewable tablet Chew 81 mg by mouth daily.    Marland Kitchen BENICAR 40 MG tablet Take 40 mg by mouth daily.     . cholecalciferol (VITAMIN D) 1000 UNITS tablet Take 1,000 Units by mouth daily.    . IRON PO Take 1 tablet by mouth daily.    Marland Kitchen levothyroxine  (SYNTHROID, LEVOTHROID) 50 MCG tablet Take 50 mcg by mouth daily.    . Multiple Vitamins-Minerals (PX SENIOR VITAMIN PO) Take 1 tablet by mouth daily.     . nebivolol (BYSTOLIC) 5 MG tablet Take 5 mg by mouth daily.    Marland Kitchen omeprazole (PRILOSEC) 20 MG capsule Take 20 mg by mouth daily.    . raloxifene (EVISTA) 60 MG tablet Take 60 mg by mouth daily.    . rosuvastatin (CRESTOR) 10 MG tablet Take 10 mg by mouth at bedtime.     . verapamil (CALAN-SR) 240 MG CR tablet Take 240 mg by mouth daily.     . vitamin C (ASCORBIC ACID) 500 MG tablet Take 500 mg by mouth daily.    . vitamin E (VITAMIN E) 400 UNIT capsule Take 400 Units by mouth daily.     No current facility-administered medications for this visit.      Physical Exam  Blood pressure (!) 160/102, pulse 90, temperature 97.4 F (36.3 C), height 5\' 4"  (1.626 m), weight 134 lb (60.8 kg).  Constitutional: overall normal hygiene, normal nutrition, well developed, normal grooming, normal body habitus. Assistive device:walker  Musculoskeletal: gait and station Limp left, muscle tone and strength are normal, no tremors or atrophy is present.  .  Neurological: coordination overall normal.  Deep tendon reflex/nerve stretch intact.  Sensation normal.  Cranial nerves II-XII intact.   Skin:   Normal overall no scars, lesions, ulcers or rashes. No psoriasis.  Psychiatric: Alert and oriented x 3.  Recent memory intact, remote memory unclear.  Normal mood and affect. Well groomed.  Good eye contact.  Cardiovascular: overall no swelling, no varicosities, no edema bilaterally, normal temperatures of the legs and arms, no clubbing, cyanosis and good capillary refill.  Lymphatic: palpation is normal.  The left lower extremity is examined:  Inspection:  Thigh:  Non-tender and no defects  Knee has swelling 2+ effusion.                        Joint tenderness is present                        Patient is tender over the medial joint line  Lower Leg:   Has normal appearance and no tenderness or defects  Ankle:  Non-tender and no defects  Foot:  Non-tender and no defects Range of Motion:  Knee:  Range of motion is: 0-100                        Crepitus is  present  Ankle:  Range of motion is normal. Strength and Tone:  The left lower extremity has normal strength and tone. Stability:  Knee:  The knee has positive medial McMurray.  Ankle:  The ankle is stable.    The patient has been educated about the nature of the problem(s)  and counseled on treatment options.  The patient appeared to understand what I have discussed and is in agreement with it.  Encounter Diagnosis  Name Primary?  . Chronic pain of left knee Yes    PLAN Call if any problems.  Precautions discussed.  Continue current medications.   Return to clinic to see Dr. Aline Brochure for possible surgery evaluation.   Electronically Signed Sanjuana Kava, MD 4/3/20181:57 PM

## 2017-02-24 NOTE — Patient Instructions (Signed)
You have decided to proceed with knee replacement surgery. You have decided not to continue with nonoperative measures such as but not limited to oral medication, weight loss, activity modification, physical therapy, bracing, or injection.  We will perform the procedure commonly known as total knee replacement. Some of the risks associated with knee replacement surgery include but are not limited to Bleeding Infection Swelling Stiffness Blood clot Pain that persists even after surgery  Infection is especially devastating complication of knee surgery although rare. If infection does occur your implant will usually have to be removed and several surgeries and antibiotics will be needed to eradicate the infection prior to performing a repeat replacement.   In some cases amputation is required to eradicate the infection. In other rare cases a knee fusion is needed    If you're not comfortable with these risks and would like to continue with nonoperative treatment please let Dr. Irma Roulhac know prior to your surgery.  

## 2017-02-24 NOTE — Addendum Note (Signed)
Addended by: Moreen Fowler R on: 02/24/2017 02:10 PM   Modules accepted: Orders

## 2017-02-26 ENCOUNTER — Encounter: Payer: Self-pay | Admitting: Internal Medicine

## 2017-03-05 ENCOUNTER — Other Ambulatory Visit: Payer: Self-pay | Admitting: *Deleted

## 2017-03-05 DIAGNOSIS — Z96652 Presence of left artificial knee joint: Secondary | ICD-10-CM

## 2017-03-05 NOTE — Patient Instructions (Signed)
Glenda Nolan  03/05/2017     @PREFPERIOPPHARMACY @   Your procedure is scheduled on 03/18/2017.  Report to [facility] at 7:00 A.M.  Call this number if you have problems the morning of surgery:  [phone]   Remember:  Do not eat food or drink liquids after midnight.  Take these medicines the morning of surgery with A SIP OF WATER benicar, Edarbi, Synthroid, Metoprolol, Prilosec, Evistsa, Calan   Do not wear jewelry, make-up or nail polish.  Do not wear lotions, powders, or perfumes, or deoderant.  Do not shave 48 hours prior to surgery.  Men may shave face and neck.  Do not bring valuables to the hospital.  [facility] is not responsible for any belongings or valuables.  Contacts, dentures or bridgework may not be worn into surgery.  Leave your suitcase in the car.  After surgery it may be brought to your room.  For patients admitted to the hospital, discharge time will be determined by your treatment team.  Patients discharged the day of surgery will not be allowed to drive home.    Please read over the following fact sheets that you were given. Surgical Site Infection Prevention and Anesthesia Post-op Instructions     PATIENT INSTRUCTIONS POST-ANESTHESIA  IMMEDIATELY FOLLOWING SURGERY:  Do not drive or operate machinery for the first twenty four hours after surgery.  Do not make any important decisions for twenty four hours after surgery or while taking narcotic pain medications or sedatives.  If you develop intractable nausea and vomiting or a severe headache please notify your doctor immediately.  FOLLOW-UP:  Please make an appointment with your surgeon as instructed. You do not need to follow up with anesthesia unless specifically instructed to do so.  WOUND CARE INSTRUCTIONS (if applicable):  Keep a dry clean dressing on the anesthesia/puncture wound site if there is drainage.  Once the wound has quit draining you may leave it open to air.  Generally you should  leave the bandage intact for twenty four hours unless there is drainage.  If the epidural site drains for more than 36-48 hours please call the anesthesia department.  QUESTIONS?:  Please feel free to call your physician or the hospital operator if you have any questions, and they will be happy to assist you.      Total Knee Replacement Total knee replacement is a procedure to replace the knee joint with an artificial (prosthetic) knee joint. The purpose of this surgery is to reduce knee pain and improve knee function. The prosthetic knee joint (prosthesis) is usually made of metal and plastic. It replaces parts of the thigh bone (femur), lower leg bone (tibia), and kneecap (patella) that are removed during the procedure. Tell a health care provider about:  Any allergies you have.  All medicines you are taking, including vitamins, herbs, eye drops, creams, and over-the-counter medicines.  Any problems you or family members have had with anesthetic medicines.  Any blood disorders you have.  Any surgeries you have had.  Any medical conditions you have.  Whether you are pregnant or may be pregnant. What are the risks? Generally, this is a safe procedure. However, problems may occur, including:  Infection.  Bleeding.  Allergic reactions to medicines.  Damage to other structures or organs.  Decreased range of motion of the knee.  Instability of the knee.  Loosening of the prosthetic joint.  Knee pain that does not go away (chronic pain). What happens before the procedure?  Ask  your health care provider about:  Changing or stopping your regular medicines. This is especially important if you are taking diabetes medicines or blood thinners.  Taking medicines such as aspirin and ibuprofen. These medicines can thin your blood. Do not take these medicines before your procedure if your health care provider instructs you not to.  Have dental care and routine cleanings completed  before your procedure. Plan to not have dental work done for 3 months after your procedure. Germs from anywhere in your body, including your mouth, can travel to your new joint and infect it.  Follow instructions from your health care provider about eating or drinking restrictions.  Ask your health care provider how your surgical site will be marked or identified.  You may be given antibiotic medicine to help prevent infection.  If your health care provider prescribes physical therapy, do exercises as instructed.  Do not use any tobacco products, such as cigarettes, chewing tobacco, or e-cigarettes. If you need help quitting, ask your health care provider.  You may have a physical exam.  You may have tests, such as:  X-rays.  MRI.  CT scan.  Bone scans.  You may have a blood or urine sample taken.  Plan to have someone take you home after the procedure.  If you will be going home right after the procedure, plan to have someone with you for at least 24 hours. It is recommended that you have someone to help care for you for at least 4-6 weeks after your procedure. What happens during the procedure?  To reduce your risk of infection:  Your health care team will wash or sanitize their hands.  Your skin will be washed with soap.  An IV tube will be inserted into one of your veins.  You will be given one or more of the following:  A medicine to help you relax (sedative).  A medicine to numb the area (local anesthetic).  A medicine to make you fall asleep (general anesthetic).  A medicine that is injected into your spine to numb the area below and slightly above the injection site (spinal anesthetic).  A medicine that is injected into an area of your body to numb everything below the injection site (regional anesthetic).  An incision will be made in your knee.  Damaged cartilage and bone will be removed from your femur, tibia, and patella.  Parts of the prosthesis  (liners)will be placed over the areas of bone and cartilage that were removed. A metal liner will be placed over your femur, and plastic liners will be placed over your tibia and the underside of your patella.  One or more small tubes (drains) may be placed near your incision to help drain extra fluid from your surgical site.  Your incision will be closed with stitches (sutures), skin glue, or adhesive strips. Medicine may be applied to your incision.  A bandage (dressing) will be placed over your incision. The procedure may vary among health care providers and hospitals. What happens after the procedure?  Your blood pressure, heart rate, breathing rate, and blood oxygen level will be monitored often until the medicines you were given have worn off.  You may continue to receive fluids and medicines through an IV tube.  You will have some pain. Pain medicines will be available to help you.  You may have fluid coming from one or more drains in your incision.  You may have to wear compression stockings. These stockings help to prevent blood  clots and reduce swelling in your legs.  You will be encouraged to move around as much as possible.  You may be given a continuous passive motion machine to use at home. You will be shown how to use this machine.  Do not drive for 24 hours if you received a sedative. This information is not intended to replace advice given to you by your health care provider. Make sure you discuss any questions you have with your health care provider. Document Released: 02/16/2001 Document Revised: 07/14/2016 Document Reviewed: 10/17/2015 Elsevier Interactive Patient Education  2017 [employer].

## 2017-03-10 ENCOUNTER — Encounter: Payer: Self-pay | Admitting: *Deleted

## 2017-03-10 NOTE — Progress Notes (Signed)
SURGICAL PRE AUTHORIZATION NOT REQUIRED FOR CPT [phone] INPATIENT AUTHORIZATION REQUIRED AT TIME OF ADMISSION  REFERENCE SHANE B 03/10/17 9:39A

## 2017-03-11 ENCOUNTER — Encounter (HOSPITAL_COMMUNITY): Payer: Self-pay

## 2017-03-11 ENCOUNTER — Encounter (HOSPITAL_COMMUNITY)
Admission: RE | Admit: 2017-03-11 | Discharge: 2017-03-11 | Disposition: A | Discharge status: Home / Self Care | Payer: Medicare Other | Source: Ambulatory Visit | Attending: Orthopedic Surgery | Admitting: Orthopedic Surgery

## 2017-03-11 DIAGNOSIS — Z01812 Encounter for preprocedural laboratory examination: Secondary | ICD-10-CM | POA: Insufficient documentation

## 2017-03-11 DIAGNOSIS — M25562 Pain in left knee: Secondary | ICD-10-CM | POA: Insufficient documentation

## 2017-03-11 HISTORY — DX: Anemia, unspecified: D64.9

## 2017-03-11 LAB — BASIC METABOLIC PANEL
Anion gap: 8 (ref 5–15)
BUN: 8 mg/dL (ref 6–20)
CO2: 27 mmol/L (ref 22–32)
Calcium: 9 mg/dL (ref 8.9–10.3)
Chloride: 99 mmol/L — ABNORMAL LOW (ref 101–111)
Creatinine, Ser: 0.86 mg/dL (ref 0.44–1.00)
GFR calc Af Amer: >60 mL/min (ref >60)
GFR calc non Af Amer: >60 mL/min (ref >60)
Glucose, Bld: 139 mg/dL — ABNORMAL HIGH (ref 65–99)
Potassium: 3.6 mmol/L (ref 3.5–5.1)
Sodium: 134 mmol/L — ABNORMAL LOW (ref 135–145)

## 2017-03-11 LAB — CBC WITH DIFFERENTIAL/PLATELET
Basophils Absolute: 0 10*3/uL (ref 0.0–0.1)
Basophils Relative: 0 %
Eosinophils Absolute: 0.2 10*3/uL (ref 0.0–0.7)
Eosinophils Relative: 3 %
HCT: 39.9 % (ref 36.0–46.0)
Hemoglobin: 13.7 g/dL (ref 12.0–15.0)
Lymphocytes Relative: 21 %
Lymphs Abs: 1.7 10*3/uL (ref 0.7–4.0)
MCH: 31.4 pg (ref 26.0–34.0)
MCHC: 34.3 g/dL (ref 30.0–36.0)
MCV: 91.3 fL (ref 78.0–100.0)
Monocytes Absolute: 0.6 10*3/uL (ref 0.1–1.0)
Monocytes Relative: 8 %
Neutro Abs: 5.4 10*3/uL (ref 1.7–7.7)
Neutrophils Relative %: 68 %
Platelets: 253 10*3/uL (ref 150–400)
RBC: 4.37 MIL/uL (ref 3.87–5.11)
RDW: 13.1 % (ref 11.5–15.5)
WBC: 7.9 10*3/uL (ref 4.0–10.5)

## 2017-03-11 LAB — APTT: aPTT: 27 seconds (ref 24–36)

## 2017-03-11 LAB — PROTIME-INR
INR: 0.89
Prothrombin Time: 12 seconds (ref 11.4–15.2)

## 2017-03-11 LAB — SURGICAL PCR SCREEN
MRSA, PCR: NEGATIVE
Staphylococcus aureus: NEGATIVE

## 2017-03-11 LAB — ABO/RH: ABO/RH(D): O NEG

## 2017-03-12 ENCOUNTER — Other Ambulatory Visit (HOSPITAL_COMMUNITY): Payer: Self-pay

## 2017-03-17 NOTE — H&P (Signed)
TOTAL KNEE ADMISSION H&P  Patient is being admitted for left total knee arthroplasty.  Subjective:  Chief Complaint:left knee pain.  HPI: Glenda Nolan, 82 y.o. female, has a history of pain and functional disability in the left knee due to arthritis and has failed non-surgical conservative treatments for greater than 12 weeks to includeNSAID's and/or analgesics, corticosteriod injections, use of assistive devices and activity modification.  Onset of symptoms was gradual, starting several years ago years ago with gradually worsening course since that time.  Patient currently rates pain in the left knee(s) at 7 out of 10 with activity. Patient has worsening of pain with activity and weight bearing, pain that interferes with activities of daily living, pain with passive range of motion, crepitus and joint swelling.  Patient has evidence of subchondral cysts, subchondral sclerosis, periarticular osteophytes and joint space narrowing by imaging studies. There is no active infection.  Patient Active Problem List   Diagnosis Date Noted  . Heme positive stool 03/22/2012  . Melena 03/22/2012  . GERD (gastroesophageal reflux disease) 03/22/2012  . CARCINOMA, COLON 04/26/2009  . MIGRAINE HEADACHE 04/26/2009  . HYPERTENSION 04/26/2009  . DEGENERATIVE JOINT DISEASE 04/26/2009  . IRON DEFICIENCY ANEMIA, HX OF 04/26/2009  . KNEE, ARTHRITIS, DEGEN./OSTEO 03/01/2009  . KNEE PAIN 03/01/2009   Past Medical History:  Diagnosis Date  . Anemia   . Arthritis   . Cataracts, bilateral    pending surgery May 2013  . Colon cancer ([place]) 1994   Stage IIB  . Diverticulosis   . GERD (gastroesophageal reflux disease)   . HTN (hypertension)   . Hyperlipidemia   . Hypothyroidism   . Migraines    occasional migraines  . Osteoporosis     Past Surgical History:  Procedure Laterality Date  . CATARACT EXTRACTION W/PHACO Left 08/01/2013   Procedure: CATARACT EXTRACTION PHACO AND INTRAOCULAR LENS PLACEMENT (IOC);   Surgeon: Tonny Branch, MD;  Location: [facility];  Service: Ophthalmology;  Laterality: Left;  CDE:11.47  . CATARACT EXTRACTION W/PHACO Right 08/15/2013   Procedure: CATARACT EXTRACTION PHACO AND INTRAOCULAR LENS PLACEMENT (IOC);  Surgeon: Tonny Branch, MD;  Location: [facility];  Service: Ophthalmology;  Laterality: Right;  CDE:  16.81  . COLON SURGERY  1994   Right  . COLONOSCOPY  04/01/2012   Rourk-Colonic ulcers at the anastomosis likely NSAID related, although ischemia is not excluded, status post biopsy, left-sided diverticula.  Internal hemorrhoids and anal papilla  . COLONOSCOPY W/ BIOPSIES  05/04/09   Dr. Madolyn Frieze papilla, diverticulosis  . DILATION AND CURETTAGE OF UTERUS    . ESOPHAGOGASTRODUODENOSCOPY  04/01/2012   Rourk-Normal esophagus, small hiatal hernia, deformity, scarring friability of the antrum/prepyloric mucosa suggestive of prior peptic ulcer disease, normal D1 and D2.  . HEMORRHOID SURGERY    . knee arthrocopy(Right)      No prescriptions prior to admission.   No current facility-administered medications for this encounter.   Current Outpatient Prescriptions:  .  acetaminophen (TYLENOL) 325 MG tablet, Take 325 mg by mouth daily after lunch. , Disp: , Rfl:  .  alendronate (FOSAMAX) 70 MG tablet, Take 70 mg by mouth every Monday. , Disp: , Rfl:  .  Artificial Tear Solution (SOOTHE XP) SOLN, Place 1 drop into both eyes 2 (two) times daily as needed (for dry eyes)., Disp: , Rfl:  .  aspirin 81 MG chewable tablet, Chew 81 mg by mouth daily after breakfast. (0800), Disp: , Rfl:  .  cholecalciferol (VITAMIN D) 1000 UNITS tablet, Take 1,000 Units by  mouth daily after lunch. , Disp: , Rfl:  .  EDARBI 80 MG TABS, Take 80 mg by mouth daily after breakfast. (0800), Disp: , Rfl:  .  IRON PO, Take 1 tablet by mouth daily after lunch. , Disp: , Rfl:  .  levothyroxine (SYNTHROID, LEVOTHROID) 50 MCG tablet, Take 50 mcg by mouth daily after breakfast. (0800), Disp: , Rfl:  .  metoprolol  tartrate (LOPRESSOR) 25 MG tablet, Take 25 mg by mouth 2 (two) times daily. , Disp: , Rfl:  .  Multiple Vitamins-Minerals (PX SENIOR VITAMIN PO), Take 1 tablet by mouth daily after lunch. , Disp: , Rfl:  .  omeprazole (PRILOSEC) 20 MG capsule, Take 20 mg by mouth daily after breakfast. (0800), Disp: , Rfl:  .  raloxifene (EVISTA) 60 MG tablet, Take 60 mg by mouth daily after breakfast. (0800), Disp: , Rfl:  .  simvastatin (ZOCOR) 40 MG tablet, Take 40 mg by mouth at bedtime. , Disp: , Rfl:  .  verapamil (CALAN-SR) 240 MG CR tablet, Take 240 mg by mouth daily after breakfast. (0800), Disp: , Rfl:  .  vitamin E (VITAMIN E) 400 UNIT capsule, Take 400 Units by mouth daily after lunch. , Disp: , Rfl:   No Known Allergies  Social History  Substance Use Topics  . Smoking status: Former Smoker    Packs/day: 0.50    Years: 10.00    Types: Cigarettes    Quit date: 11/24/1992  . Smokeless tobacco: Former Systems developer    Quit date: 12/22/1992  . Alcohol use Yes     Comment: a glass of wine once a year    Family History  Problem Relation Age of Onset  . Leukemia Mother 51  . Hypertension Mother   . Heart failure Father 13  . Coronary artery disease Sister     x2  . Stroke Son      Review of Systems  Constitutional: Negative for fever.  HENT: Negative for ear discharge and ear pain.   Eyes: Negative for blurred vision, discharge and redness.  Respiratory: Negative for shortness of breath and stridor.   Cardiovascular: Negative for chest pain.  Gastrointestinal: Negative for abdominal pain.  Genitourinary: Negative for hematuria.  Musculoskeletal: Positive for joint pain and myalgias.  Skin: Negative for rash.  Neurological: Negative for dizziness, tremors and seizures.  Endo/Heme/Allergies: Negative for environmental allergies and polydipsia. Does not bruise/bleed easily.  Psychiatric/Behavioral: Negative for depression. The patient is not nervous/anxious.     Objective:  Physical Exam   Constitutional: She is oriented to person, place, and time. She appears well-nourished.  Eyes: Right eye exhibits no discharge. Left eye exhibits no discharge. No scleral icterus.  Neck: Neck supple. No JVD present. No tracheal deviation present.  Cardiovascular: Intact distal pulses.   Respiratory: Effort normal. No stridor.  GI: Soft. She exhibits no distension.  Neurological: She is alert and oriented to person, place, and time. She has normal reflexes. She exhibits normal muscle tone. Coordination normal.  Skin: Skin is warm and dry. No rash noted. No erythema. No pallor.  Psychiatric: She has a normal mood and affect. Her behavior is normal. Thought content normal.   Left knee: Valgus alignment moderate Tenderness to palpation along the lateral joint line. Patient actually has hyperextension of approximately 10. Flexion is about 110. There are no pseudo-laxity collateral ligaments the anterior and posterior cruciate ligaments are stable. Extension strength is normal. Muscle tone normal.  Upper extremities: no contracture , subluxation, atrophy or tremor;  normal gross alignment. Right leg generalized alignment is normal. There is no subluxation of any other major joints, and is no atrophy or tremor.   Vital signs in last 24 hours:    Labs: CBC Latest Ref Rng & Units 03/11/2017 07/27/2013 04/12/2012  WBC 4.0 - 10.5 K/uL 7.9 - 9.2  Hemoglobin 12.0 - 15.0 g/dL 13.7 13.9 13.6  Hematocrit 36.0 - 46.0 % 39.9 40.7 40.7  Platelets 150 - 400 K/uL 253 - 266   BMP Latest Ref Rng & Units 03/11/2017 07/27/2013 03/02/2012  Glucose 65 - 99 mg/dL 139(H) 95 -  BUN 6 - 20 mg/dL 8 9 8   Creatinine 0.44 - 1.00 mg/dL 0.86 0.82 0.81  Sodium 135 - 145 mmol/L 134(L) 135 138  Potassium 3.5 - 5.1 mmol/L 3.6 4.0 5.1  Chloride 101 - 111 mmol/L 99(L) 94(L) 99  CO2 22 - 32 mmol/L 27 30 25   Calcium 8.9 - 10.3 mg/dL 9.0 9.5 9.3     Estimated body mass index is 24.37 kg/m as calculated from the following:    Height as of 03/11/17: 5\' 4"  (1.626 m).   Weight as of 03/11/17: 142 lb (64.4 kg).   Imaging Review Plain radiographs demonstrate severe degenerative joint disease of the left knee(s). The overall alignment issignificant valgus. The bone quality appears to be good for age and reported activity level.  Assessment/Plan:  End stage arthritis, left knee   LEFT TOTAL KNEE ARTHROPLASTY   The patient history, physical examination, clinical judgment of the provider and imaging studies are consistent with end stage degenerative joint disease of the left knee(s) and total knee arthroplasty is deemed medically necessary. The treatment options including medical management, injection therapy arthroscopy and arthroplasty were discussed at length. The risks and benefits of total knee arthroplasty were presented and reviewed. The risks due to aseptic loosening, infection, stiffness, patella tracking problems, thromboembolic complications and other imponderables were discussed. The patient acknowledged the explanation, agreed to proceed with the plan and consent was signed. Patient is being admitted for inpatient treatment for surgery, pain control, PT, OT, prophylactic antibiotics, VTE prophylaxis, progressive ambulation and ADL's and discharge planning. The patient is planning to be discharged to skilled nursing facility

## 2017-03-18 ENCOUNTER — Inpatient Hospital Stay (HOSPITAL_COMMUNITY): Payer: Medicare Other | Admitting: Anesthesiology

## 2017-03-18 ENCOUNTER — Encounter (HOSPITAL_COMMUNITY)
Admission: RE | Disposition: A | Discharge status: Home Health | Payer: Self-pay | Source: Ambulatory Visit | Attending: Orthopedic Surgery

## 2017-03-18 ENCOUNTER — Inpatient Hospital Stay (HOSPITAL_COMMUNITY)
Admission: RE | Admit: 2017-03-18 | Discharge: 2017-03-20 | DRG: 470 | Disposition: A | Discharge status: Home Health | Payer: Medicare Other | Source: Ambulatory Visit | Attending: Orthopedic Surgery | Admitting: Orthopedic Surgery

## 2017-03-18 ENCOUNTER — Encounter (HOSPITAL_COMMUNITY): Payer: Self-pay | Admitting: *Deleted

## 2017-03-18 ENCOUNTER — Inpatient Hospital Stay (HOSPITAL_COMMUNITY): Payer: Medicare Other

## 2017-03-18 DIAGNOSIS — I10 Essential (primary) hypertension: Secondary | ICD-10-CM | POA: Diagnosis not present

## 2017-03-18 DIAGNOSIS — M810 Age-related osteoporosis without current pathological fracture: Secondary | ICD-10-CM | POA: Diagnosis present

## 2017-03-18 DIAGNOSIS — E876 Hypokalemia: Secondary | ICD-10-CM | POA: Diagnosis not present

## 2017-03-18 DIAGNOSIS — K219 Gastro-esophageal reflux disease without esophagitis: Secondary | ICD-10-CM | POA: Diagnosis not present

## 2017-03-18 DIAGNOSIS — Z85038 Personal history of other malignant neoplasm of large intestine: Secondary | ICD-10-CM

## 2017-03-18 DIAGNOSIS — M1712 Unilateral primary osteoarthritis, left knee: Secondary | ICD-10-CM | POA: Diagnosis not present

## 2017-03-18 DIAGNOSIS — Z96659 Presence of unspecified artificial knee joint: Secondary | ICD-10-CM

## 2017-03-18 DIAGNOSIS — Z96652 Presence of left artificial knee joint: Secondary | ICD-10-CM

## 2017-03-18 DIAGNOSIS — Z823 Family history of stroke: Secondary | ICD-10-CM | POA: Diagnosis not present

## 2017-03-18 DIAGNOSIS — Z79899 Other long term (current) drug therapy: Secondary | ICD-10-CM | POA: Diagnosis not present

## 2017-03-18 DIAGNOSIS — Z8249 Family history of ischemic heart disease and other diseases of the circulatory system: Secondary | ICD-10-CM | POA: Diagnosis not present

## 2017-03-18 DIAGNOSIS — Z87891 Personal history of nicotine dependence: Secondary | ICD-10-CM

## 2017-03-18 DIAGNOSIS — Z7982 Long term (current) use of aspirin: Secondary | ICD-10-CM | POA: Diagnosis not present

## 2017-03-18 DIAGNOSIS — Z806 Family history of leukemia: Secondary | ICD-10-CM

## 2017-03-18 DIAGNOSIS — E039 Hypothyroidism, unspecified: Secondary | ICD-10-CM | POA: Diagnosis present

## 2017-03-18 DIAGNOSIS — Z7983 Long term (current) use of bisphosphonates: Secondary | ICD-10-CM | POA: Diagnosis not present

## 2017-03-18 DIAGNOSIS — E785 Hyperlipidemia, unspecified: Secondary | ICD-10-CM | POA: Diagnosis present

## 2017-03-18 DIAGNOSIS — Z471 Aftercare following joint replacement surgery: Secondary | ICD-10-CM | POA: Diagnosis not present

## 2017-03-18 HISTORY — PX: TOTAL KNEE ARTHROPLASTY: SHX125

## 2017-03-18 SURGERY — ARTHROPLASTY, KNEE, TOTAL
Anesthesia: Spinal | Site: Knee | Laterality: Left

## 2017-03-18 MED ORDER — PROPOFOL 500 MG/50ML IV EMUL
INTRAVENOUS | Status: DC | PRN
  Administered 2017-03-18: 75 ug/kg/min via INTRAVENOUS
  Administered 2017-03-18: 10:00:00 via INTRAVENOUS

## 2017-03-18 MED ORDER — ACETAMINOPHEN 650 MG RE SUPP
650.0000 mg | Freq: Four times a day (QID) | RECTAL | Status: DC | PRN
  Filled 2017-03-18: qty 1

## 2017-03-18 MED ORDER — DEXAMETHASONE SODIUM PHOSPHATE 10 MG/ML IJ SOLN
INTRAMUSCULAR | Status: AC
  Filled 2017-03-18: qty 1

## 2017-03-18 MED ORDER — EPHEDRINE SULFATE 50 MG/ML IJ SOLN
INTRAMUSCULAR | Status: AC
  Filled 2017-03-18: qty 1

## 2017-03-18 MED ORDER — HYDROCODONE-ACETAMINOPHEN 5-325 MG PO TABS
ORAL_TABLET | ORAL | Status: AC
  Filled 2017-03-18: qty 1

## 2017-03-18 MED ORDER — METOCLOPRAMIDE HCL 10 MG PO TABS
5.0000 mg | ORAL_TABLET | Freq: Three times a day (TID) | ORAL | Status: DC | PRN
  Filled 2017-03-18: qty 2

## 2017-03-18 MED ORDER — MORPHINE SULFATE (PF) 2 MG/ML IV SOLN
1.0000 mg | INTRAVENOUS | Status: DC | PRN
  Administered 2017-03-19: 1 mg via INTRAVENOUS
  Filled 2017-03-18: qty 1

## 2017-03-18 MED ORDER — SODIUM CHLORIDE 0.9 % IV SOLN
INTRAVENOUS | Status: DC | PRN
  Administered 2017-03-18: 60 mL

## 2017-03-18 MED ORDER — CELECOXIB 100 MG PO CAPS
200.0000 mg | ORAL_CAPSULE | Freq: Two times a day (BID) | ORAL | Status: DC
  Administered 2017-03-18 – 2017-03-20 (×5): 200 mg via ORAL
  Filled 2017-03-18 (×5): qty 2

## 2017-03-18 MED ORDER — DIPHENHYDRAMINE HCL 12.5 MG/5ML PO ELIX
12.5000 mg | ORAL_SOLUTION | ORAL | Status: DC | PRN
  Filled 2017-03-18: qty 10

## 2017-03-18 MED ORDER — DEXTROSE 5 % IV SOLN
500.0000 mg | Freq: Four times a day (QID) | INTRAVENOUS | Status: DC | PRN
  Filled 2017-03-18: qty 5

## 2017-03-18 MED ORDER — RALOXIFENE HCL 60 MG PO TABS
60.0000 mg | ORAL_TABLET | Freq: Every day | ORAL | Status: DC
  Administered 2017-03-19 – 2017-03-20 (×2): 60 mg via ORAL
  Filled 2017-03-18 (×2): qty 1

## 2017-03-18 MED ORDER — HYDROCODONE-ACETAMINOPHEN 7.5-325 MG PO TABS
1.0000 | ORAL_TABLET | Freq: Four times a day (QID) | ORAL | Status: DC
  Administered 2017-03-18 – 2017-03-19 (×4): 1 via ORAL
  Filled 2017-03-18 (×4): qty 1

## 2017-03-18 MED ORDER — DOCUSATE SODIUM 100 MG PO CAPS
100.0000 mg | ORAL_CAPSULE | Freq: Two times a day (BID) | ORAL | Status: DC
  Administered 2017-03-18 – 2017-03-20 (×4): 100 mg via ORAL
  Filled 2017-03-18 (×4): qty 1

## 2017-03-18 MED ORDER — SOOTHE XP OP SOLN: 1.0000 [drp] | Freq: Two times a day (BID) | OPHTHALMIC | Status: DC | PRN

## 2017-03-18 MED ORDER — SODIUM CHLORIDE 0.9 % IR SOLN
Status: DC | PRN
  Administered 2017-03-18: 3000 mL

## 2017-03-18 MED ORDER — CEFAZOLIN SODIUM-DEXTROSE 2-4 GM/100ML-% IV SOLN
2.0000 g | INTRAVENOUS | Status: AC
  Administered 2017-03-18: 2 g via INTRAVENOUS
  Filled 2017-03-18: qty 100

## 2017-03-18 MED ORDER — MIDAZOLAM HCL 2 MG/2ML IJ SOLN
INTRAMUSCULAR | Status: AC
  Filled 2017-03-18: qty 2

## 2017-03-18 MED ORDER — BUPIVACAINE LIPOSOME 1.3 % IJ SUSP
20.0000 mL | Freq: Once | INTRAMUSCULAR | Status: DC
  Filled 2017-03-18: qty 20

## 2017-03-18 MED ORDER — PROPOFOL 10 MG/ML IV BOLUS
INTRAVENOUS | Status: AC
  Filled 2017-03-18: qty 40

## 2017-03-18 MED ORDER — FENTANYL CITRATE (PF) 100 MCG/2ML IJ SOLN
INTRAMUSCULAR | Status: AC
  Filled 2017-03-18: qty 2

## 2017-03-18 MED ORDER — 0.9 % SODIUM CHLORIDE (POUR BTL) OPTIME
TOPICAL | Status: DC | PRN
  Administered 2017-03-18: 1000 mL

## 2017-03-18 MED ORDER — VERAPAMIL HCL ER 240 MG PO TBCR
240.0000 mg | EXTENDED_RELEASE_TABLET | Freq: Every day | ORAL | Status: DC
  Administered 2017-03-19 – 2017-03-20 (×2): 240 mg via ORAL
  Filled 2017-03-18 (×2): qty 1

## 2017-03-18 MED ORDER — CEFAZOLIN SODIUM-DEXTROSE 1-4 GM/50ML-% IV SOLN
1.0000 g | Freq: Four times a day (QID) | INTRAVENOUS | Status: AC
  Administered 2017-03-18 (×2): 1 g via INTRAVENOUS
  Filled 2017-03-18 (×2): qty 50

## 2017-03-18 MED ORDER — PANTOPRAZOLE SODIUM 40 MG PO TBEC
40.0000 mg | DELAYED_RELEASE_TABLET | Freq: Every day | ORAL | Status: DC
  Administered 2017-03-19 – 2017-03-20 (×2): 40 mg via ORAL
  Filled 2017-03-18 (×2): qty 1

## 2017-03-18 MED ORDER — PREGABALIN 50 MG PO CAPS
50.0000 mg | ORAL_CAPSULE | Freq: Once | ORAL | Status: AC
  Administered 2017-03-18: 50 mg via ORAL

## 2017-03-18 MED ORDER — CELECOXIB 400 MG PO CAPS
400.0000 mg | ORAL_CAPSULE | Freq: Once | ORAL | Status: AC
  Administered 2017-03-18: 400 mg via ORAL

## 2017-03-18 MED ORDER — ALUM & MAG HYDROXIDE-SIMETH 200-200-20 MG/5ML PO SUSP: 30.0000 mL | ORAL | Status: DC | PRN

## 2017-03-18 MED ORDER — FENTANYL CITRATE (PF) 100 MCG/2ML IJ SOLN
INTRAMUSCULAR | Status: DC | PRN
  Administered 2017-03-18: 25 ug via INTRAVENOUS
  Administered 2017-03-18: 25 ug via INTRATHECAL

## 2017-03-18 MED ORDER — ONDANSETRON HCL 4 MG/2ML IJ SOLN
INTRAMUSCULAR | Status: AC
  Filled 2017-03-18: qty 2

## 2017-03-18 MED ORDER — VITAMIN E 180 MG (400 UNIT) PO CAPS
400.0000 [IU] | ORAL_CAPSULE | Freq: Every day | ORAL | Status: DC
  Administered 2017-03-18 – 2017-03-20 (×3): 400 [IU] via ORAL
  Filled 2017-03-18 (×3): qty 1

## 2017-03-18 MED ORDER — BUPIVACAINE-EPINEPHRINE (PF) 0.5% -1:200000 IJ SOLN
INTRAMUSCULAR | Status: AC
  Filled 2017-03-18: qty 30

## 2017-03-18 MED ORDER — POLYVINYL ALCOHOL 1.4 % OP SOLN
1.0000 [drp] | OPHTHALMIC | Status: DC | PRN
  Administered 2017-03-18: 1 [drp] via OPHTHALMIC
  Filled 2017-03-18: qty 15

## 2017-03-18 MED ORDER — CELECOXIB 400 MG PO CAPS
ORAL_CAPSULE | ORAL | Status: AC
  Filled 2017-03-18: qty 1

## 2017-03-18 MED ORDER — BUPIVACAINE LIPOSOME 1.3 % IJ SUSP
INTRAMUSCULAR | Status: AC
  Filled 2017-03-18: qty 20

## 2017-03-18 MED ORDER — BUPIVACAINE-EPINEPHRINE (PF) 0.5% -1:200000 IJ SOLN
INTRAMUSCULAR | Status: DC | PRN
  Administered 2017-03-18: 20 mL via PERINEURAL

## 2017-03-18 MED ORDER — METHOCARBAMOL 1000 MG/10ML IJ SOLN
500.0000 mg | Freq: Once | INTRAVENOUS | Status: AC
  Administered 2017-03-18: 500 mg via INTRAVENOUS
  Filled 2017-03-18: qty 550

## 2017-03-18 MED ORDER — ONDANSETRON HCL 4 MG/2ML IJ SOLN
4.0000 mg | Freq: Four times a day (QID) | INTRAMUSCULAR | Status: DC | PRN
  Administered 2017-03-19: 4 mg via INTRAVENOUS
  Filled 2017-03-18: qty 2

## 2017-03-18 MED ORDER — MIDAZOLAM HCL 2 MG/2ML IJ SOLN
1.0000 mg | INTRAMUSCULAR | Status: DC
Start: 2017-03-18 — End: 2017-03-18
  Administered 2017-03-18: 2 mg via INTRAVENOUS

## 2017-03-18 MED ORDER — METOCLOPRAMIDE HCL 5 MG/ML IJ SOLN: 5.0000 mg | Freq: Three times a day (TID) | INTRAMUSCULAR | Status: DC | PRN

## 2017-03-18 MED ORDER — BUPIVACAINE IN DEXTROSE 0.75-8.25 % IT SOLN
INTRATHECAL | Status: DC | PRN
  Administered 2017-03-18: 13 mg via INTRATHECAL

## 2017-03-18 MED ORDER — VITAMIN D 1000 UNITS PO TABS
1000.0000 [IU] | ORAL_TABLET | Freq: Every day | ORAL | Status: DC
  Administered 2017-03-18 – 2017-03-20 (×3): 1000 [IU] via ORAL
  Filled 2017-03-18 (×3): qty 1

## 2017-03-18 MED ORDER — BISACODYL 5 MG PO TBEC: 5.0000 mg | DELAYED_RELEASE_TABLET | Freq: Every day | ORAL | Status: DC | PRN

## 2017-03-18 MED ORDER — SODIUM CHLORIDE 0.9 % IJ SOLN
INTRAMUSCULAR | Status: AC
  Filled 2017-03-18: qty 40

## 2017-03-18 MED ORDER — HYDROCODONE-ACETAMINOPHEN 5-325 MG PO TABS
1.0000 | ORAL_TABLET | Freq: Once | ORAL | Status: AC
  Administered 2017-03-18: 1 via ORAL

## 2017-03-18 MED ORDER — FLEET ENEMA 7-19 GM/118ML RE ENEM: 1.0000 | ENEMA | Freq: Once | RECTAL | Status: DC | PRN

## 2017-03-18 MED ORDER — ACETAMINOPHEN 500 MG PO TABS
500.0000 mg | ORAL_TABLET | Freq: Once | ORAL | Status: AC
  Administered 2017-03-18: 500 mg via ORAL

## 2017-03-18 MED ORDER — ACETAMINOPHEN 325 MG PO TABS: 650.0000 mg | ORAL_TABLET | Freq: Four times a day (QID) | ORAL | Status: DC | PRN

## 2017-03-18 MED ORDER — METOPROLOL TARTRATE 25 MG PO TABS
25.0000 mg | ORAL_TABLET | Freq: Two times a day (BID) | ORAL | Status: DC
  Administered 2017-03-18 – 2017-03-20 (×4): 25 mg via ORAL
  Filled 2017-03-18 (×4): qty 1

## 2017-03-18 MED ORDER — SODIUM CHLORIDE 0.9 % IJ SOLN
INTRAMUSCULAR | Status: AC
  Filled 2017-03-18: qty 10

## 2017-03-18 MED ORDER — METHOCARBAMOL 500 MG PO TABS
500.0000 mg | ORAL_TABLET | Freq: Four times a day (QID) | ORAL | Status: DC | PRN
  Filled 2017-03-18: qty 1

## 2017-03-18 MED ORDER — ONDANSETRON HCL 4 MG/2ML IJ SOLN
4.0000 mg | Freq: Once | INTRAMUSCULAR | Status: AC
  Administered 2017-03-18: 4 mg via INTRAVENOUS

## 2017-03-18 MED ORDER — FENTANYL CITRATE (PF) 100 MCG/2ML IJ SOLN: 25.0000 ug | INTRAMUSCULAR | Status: DC | PRN

## 2017-03-18 MED ORDER — LACTATED RINGERS IV SOLN
INTRAVENOUS | Status: DC
  Administered 2017-03-18 (×2): via INTRAVENOUS

## 2017-03-18 MED ORDER — SIMVASTATIN 20 MG PO TABS
40.0000 mg | ORAL_TABLET | Freq: Every day | ORAL | Status: DC
  Administered 2017-03-18 – 2017-03-19 (×2): 40 mg via ORAL
  Filled 2017-03-18: qty 2
  Filled 2017-03-18 (×2): qty 1
  Filled 2017-03-18: qty 2

## 2017-03-18 MED ORDER — IRBESARTAN 300 MG PO TABS
300.0000 mg | ORAL_TABLET | Freq: Every day | ORAL | Status: DC
  Administered 2017-03-18 – 2017-03-20 (×3): 300 mg via ORAL
  Filled 2017-03-18 (×3): qty 1

## 2017-03-18 MED ORDER — LEVOTHYROXINE SODIUM 50 MCG PO TABS
50.0000 ug | ORAL_TABLET | Freq: Every day | ORAL | Status: DC
  Administered 2017-03-19 – 2017-03-20 (×2): 50 ug via ORAL
  Filled 2017-03-18 (×2): qty 1

## 2017-03-18 MED ORDER — SUCCINYLCHOLINE CHLORIDE 20 MG/ML IJ SOLN
INTRAMUSCULAR | Status: AC
  Filled 2017-03-18: qty 1

## 2017-03-18 MED ORDER — MIDAZOLAM HCL 5 MG/5ML IJ SOLN
INTRAMUSCULAR | Status: DC | PRN
  Administered 2017-03-18 (×2): 1 mg via INTRAVENOUS

## 2017-03-18 MED ORDER — PREGABALIN 50 MG PO CAPS
ORAL_CAPSULE | ORAL | Status: AC
  Filled 2017-03-18: qty 1

## 2017-03-18 MED ORDER — DEXAMETHASONE SODIUM PHOSPHATE 10 MG/ML IJ SOLN
10.0000 mg | Freq: Once | INTRAMUSCULAR | Status: AC
  Administered 2017-03-18: 10 mg via INTRAVENOUS
  Filled 2017-03-18: qty 1

## 2017-03-18 MED ORDER — ASPIRIN EC 325 MG PO TBEC
325.0000 mg | DELAYED_RELEASE_TABLET | Freq: Every day | ORAL | Status: DC
  Administered 2017-03-19 – 2017-03-20 (×2): 325 mg via ORAL
  Filled 2017-03-18 (×2): qty 1

## 2017-03-18 MED ORDER — ONDANSETRON HCL 4 MG PO TABS: 4.0000 mg | ORAL_TABLET | Freq: Four times a day (QID) | ORAL | Status: DC | PRN

## 2017-03-18 MED ORDER — CHLORHEXIDINE GLUCONATE 4 % EX LIQD: 60.0000 mL | Freq: Once | CUTANEOUS | Status: DC

## 2017-03-18 MED ORDER — PHENOL 1.4 % MT LIQD: 1.0000 | OROMUCOSAL | Status: DC | PRN

## 2017-03-18 MED ORDER — ACETAMINOPHEN 500 MG PO TABS
ORAL_TABLET | ORAL | Status: AC
  Filled 2017-03-18: qty 1

## 2017-03-18 MED ORDER — TRANEXAMIC ACID 1000 MG/10ML IV SOLN
1000.0000 mg | INTRAVENOUS | Status: AC
  Administered 2017-03-18: 1000 mg via INTRAVENOUS
  Filled 2017-03-18: qty 10

## 2017-03-18 MED ORDER — SODIUM CHLORIDE 0.9 % IV SOLN
INTRAVENOUS | Status: DC
  Administered 2017-03-18 (×2): via INTRAVENOUS

## 2017-03-18 MED ORDER — MENTHOL 3 MG MT LOZG
1.0000 | LOZENGE | OROMUCOSAL | Status: DC | PRN
Start: 2017-03-18 — End: 2017-03-20

## 2017-03-18 SURGICAL SUPPLY — 68 items
BAG HAMPER (MISCELLANEOUS) ×2 IMPLANT
BANDAGE ESMARK 6X9 LF (GAUZE/BANDAGES/DRESSINGS) ×1 IMPLANT
BIT DRILL 3.2X128 (BIT) IMPLANT
BLADE HEX COATED 2.75 (ELECTRODE) ×2 IMPLANT
BLADE SAGITTAL 25.0X1.27X90 (BLADE) ×2 IMPLANT
BNDG CMPR 9X6 STRL LF SNTH (GAUZE/BANDAGES/DRESSINGS) ×1
BNDG ESMARK 6X9 LF (GAUZE/BANDAGES/DRESSINGS) ×2
BOWL SMART MIX CTS (DISPOSABLE) IMPLANT
CAP KNEE TOTAL 3 SIGMA ×1 IMPLANT
CEMENT HV SMART SET (Cement) ×4 IMPLANT
CLOTH BEACON ORANGE TIMEOUT ST (SAFETY) ×2 IMPLANT
COOLER CRYO CUFF IC AND MOTOR (MISCELLANEOUS) ×2 IMPLANT
COVER LIGHT HANDLE STERIS (MISCELLANEOUS) ×4 IMPLANT
CUFF CRYO KNEE LG 20X31 COOLER (ORTHOPEDIC SUPPLIES) IMPLANT
CUFF CRYO KNEE18X23 MED (MISCELLANEOUS) ×1 IMPLANT
CUFF TOURNIQUET SINGLE 24IN (TOURNIQUET CUFF) ×2 IMPLANT
DECANTER SPIKE VIAL GLASS SM (MISCELLANEOUS) ×2 IMPLANT
DRAPE BACK TABLE (DRAPES) ×2 IMPLANT
DRAPE EXTREMITY T 121X128X90 (DRAPE) ×2 IMPLANT
DRESSING AQUACEL AG ADV 3.5X12 (MISCELLANEOUS) ×1 IMPLANT
DRSG AQUACEL AG ADV 3.5X12 (MISCELLANEOUS) ×2
DRSG MEPILEX BORDER 4X12 (GAUZE/BANDAGES/DRESSINGS) ×2 IMPLANT
DURAPREP 26ML APPLICATOR (WOUND CARE) ×4 IMPLANT
ELECT REM PT RETURN 9FT ADLT (ELECTROSURGICAL) ×2
ELECTRODE REM PT RTRN 9FT ADLT (ELECTROSURGICAL) ×1 IMPLANT
EVACUATOR 3/16  PVC DRAIN (DRAIN) ×1
EVACUATOR 3/16 PVC DRAIN (DRAIN) ×1 IMPLANT
GLOVE BIO SURGEON STRL SZ7 (GLOVE) ×5 IMPLANT
GLOVE BIOGEL PI IND STRL 7.0 (GLOVE) ×2 IMPLANT
GLOVE BIOGEL PI INDICATOR 7.0 (GLOVE) ×2
GLOVE SKINSENSE NS SZ8.0 LF (GLOVE) ×2
GLOVE SKINSENSE STRL SZ8.0 LF (GLOVE) ×2 IMPLANT
GLOVE SS N UNI LF 8.5 STRL (GLOVE) ×2 IMPLANT
GOWN STRL REUS W/ TWL LRG LVL3 (GOWN DISPOSABLE) ×1 IMPLANT
GOWN STRL REUS W/TWL LRG LVL3 (GOWN DISPOSABLE) ×4 IMPLANT
GOWN STRL REUS W/TWL XL LVL3 (GOWN DISPOSABLE) ×2 IMPLANT
HANDPIECE INTERPULSE COAX TIP (DISPOSABLE) ×2
HOOD W/PEELAWAY (MISCELLANEOUS) ×8 IMPLANT
INSERT 2.5X10MM (Insert) IMPLANT
INST SET MAJOR BONE (KITS) ×2 IMPLANT
IV NS IRRIG 3000ML ARTHROMATIC (IV SOLUTION) ×2 IMPLANT
KIT BLADEGUARD II DBL (SET/KITS/TRAYS/PACK) ×2 IMPLANT
KIT ROOM TURNOVER APOR (KITS) ×2 IMPLANT
MANIFOLD NEPTUNE II (INSTRUMENTS) ×2 IMPLANT
MARKER SKIN DUAL TIP RULER LAB (MISCELLANEOUS) ×2 IMPLANT
NEEDLE HYPO 21X1.5 SAFETY (NEEDLE) ×2 IMPLANT
NS IRRIG 1000ML POUR BTL (IV SOLUTION) ×2 IMPLANT
PACK TOTAL JOINT (CUSTOM PROCEDURE TRAY) ×2 IMPLANT
PAD ARMBOARD 7.5X6 YLW CONV (MISCELLANEOUS) ×2 IMPLANT
PAD DANNIFLEX CPM (ORTHOPEDIC SUPPLIES) ×2 IMPLANT
PIN TROCAR 3 INCH (PIN) IMPLANT
SAW OSC TIP CART 19.5X105X1.3 (SAW) ×2 IMPLANT
SET BASIN LINEN APH (SET/KITS/TRAYS/PACK) ×2 IMPLANT
SET HNDPC FAN SPRY TIP SCT (DISPOSABLE) ×1 IMPLANT
STAPLER VISISTAT 35W (STAPLE) ×2 IMPLANT
SUT BRALON NAB BRD #1 30IN (SUTURE) ×4 IMPLANT
SUT MNCRL 0 VIOLET CTX 36 (SUTURE) ×1 IMPLANT
SUT MON AB 0 CT1 (SUTURE) ×2 IMPLANT
SUT MON AB 2-0 CT1 36 (SUTURE) IMPLANT
SUT MONOCRYL 0 CTX 36 (SUTURE) ×1
SYR 20CC LL (SYRINGE) IMPLANT
SYR 30ML LL (SYRINGE) ×2 IMPLANT
SYR BULB IRRIGATION 50ML (SYRINGE) ×2 IMPLANT
TOWEL OR 17X26 4PK STRL BLUE (TOWEL DISPOSABLE) ×2 IMPLANT
TOWER CARTRIDGE SMART MIX (DISPOSABLE) ×2 IMPLANT
TRAY FOLEY W/METER SILVER 16FR (SET/KITS/TRAYS/PACK) ×2 IMPLANT
WATER STERILE IRR 1000ML POUR (IV SOLUTION) ×4 IMPLANT
YANKAUER SUCT 12FT TUBE ARGYLE (SUCTIONS) ×2 IMPLANT

## 2017-03-18 NOTE — Care Management (Signed)
CM contacted [facility] regarding CPM. CPM was ordered but they are out of network with patients insurance. States they have called patient multiple times with no answer and no option to leave voicemail.

## 2017-03-18 NOTE — Progress Notes (Signed)
130 ml of bloody drainage emptied from Hemovac. Currently charged to suction. Will continue to monitor.

## 2017-03-18 NOTE — Evaluation (Signed)
Physical Therapy Evaluation Patient Details Name: Glenda Nolan MRN: [id number] DOB: [birth year] Today's Date: 03/18/2017   History of Present Illness  82 yo F s/p L TKA  Clinical Impression  Pt received in bed with pillow under L knee, dtr present, and pt is agreeable to PT evaluation.  Pt states she normally ambulates with a rollator walker, and is independent with dressing and bathing.  Her family assists with running errands, but she goes with them to the grocery store, and is able to ambulate pushing the shopping cart throughout the entire store.  During PT evaluation, she demonstrated good contraction of L quad after given tactile cues.  She was able to complete exercises.  She required Min guard for transfer sit<>Stand with RW, and Min guard for ambulation x 34ft with RW.  She is recommended to return home with HHPT and 24/7 supervision/assistance.  Discussed this with her dtr, and encouraged her to ask family members to help provide 24/7 supervision/assistance.  She will also need a RW, which her dtr states she will be able to borrow.    # OF FEET WALKED: 33ft ROM:  Flexion: 85* sitting on the EOB            Extension: -2*    Follow Up Recommendations Home health PT;Supervision/Assistance - 24 hour    Equipment Recommendations  Rolling walker with 5" wheels    Recommendations for Other Services       Precautions / Restrictions Precautions Precautions: Fall Precaution Comments: 1 last week.   Restrictions Weight Bearing Restrictions: No RUE Weight Bearing: Weight bearing as tolerated LUE Weight Bearing: Weight bearing as tolerated RLE Weight Bearing: Weight bearing as tolerated LLE Weight Bearing: Weight bearing as tolerated      Mobility  Bed Mobility Overal bed mobility: Modified Independent             General bed mobility comments: increased time, bed flat.   Transfers Overall transfer level: Needs assistance Equipment used: Rolling walker (2  wheeled) Transfers: Sit to/from Stand Sit to Stand: Min guard         General transfer comment: vc's for hand placement  Ambulation/Gait Ambulation/Gait assistance: Min guard Ambulation Distance (Feet): 20 Feet Assistive device: Rolling walker (2 wheeled) Gait Pattern/deviations: Step-to pattern   Gait velocity interpretation: <1.8 ft/sec, indicative of risk for recurrent falls General Gait Details: Pt requires assistance for RW navigation, and cues for gait sequencing.   Stairs            Wheelchair Mobility    Modified Rankin (Stroke Patients Only)       Balance Overall balance assessment: History of Falls;Needs assistance Sitting-balance support: Bilateral upper extremity supported;Feet supported Sitting balance-Leahy Scale: Good     Standing balance support: Bilateral upper extremity supported Standing balance-Leahy Scale: Fair                               Pertinent Vitals/Pain Pain Assessment: No/denies pain ("sore")    Home Living Family/patient expects to be discharged to:: Private residence Living Arrangements: Alone Available Help at Discharge: Family (5 children, and one of them stops in every day.  ) Type of Home: Apartment Home Access: Ramped entrance     Home Layout: One level Home Equipment: [place] - single point;Walker - 4 wheels;Walker - standard;Bedside commode      Prior Function     Gait / Transfers Assistance Needed: Pt normally ambulates with rollator.  ADL's / Homemaking Assistance Needed: independent with both dressing and bathing.    Comments: children assist with driving and running errands.  Pt states she ambulates around the grocery store with the shopping cart.       Hand Dominance   Dominant Hand: Right    Extremity/Trunk Assessment   Upper Extremity Assessment Upper Extremity Assessment: RUE deficits/detail RUE Deficits / Details: shoulder flexion is chronically limited     Lower Extremity  Assessment Lower Extremity Assessment: Generalized weakness       Communication   Communication: No difficulties  Cognition Arousal/Alertness: Awake/alert Behavior During Therapy: WFL for tasks assessed/performed Overall Cognitive Status: Within Functional Limits for tasks assessed                                        General Comments      Exercises Total Joint Exercises Ankle Circles/Pumps: AROM;Both;20 reps;Supine Quad Sets: Strengthening;Both;10 reps;Supine;Limitations Quad Sets Limitations: tc's for pt to be able to perform isometric contraction.   Short Arc Quad: Strengthening;Left;10 reps;Supine Heel Slides: Strengthening;Left;10 reps;Supine Goniometric ROM: L knee extension is -2* from full extension, and able to achieve 85* flexion sitting on the EOB.    Assessment/Plan    PT Assessment Patient needs continued PT services  PT Problem List Decreased strength;Decreased range of motion;Decreased activity tolerance;Decreased balance;Decreased mobility;Decreased knowledge of use of DME;Decreased safety awareness;Pain       PT Treatment Interventions DME instruction;Gait training;Functional mobility training;Therapeutic activities;Therapeutic exercise;Balance training;Patient/family education    PT Goals (Current goals can be found in the Care Plan section)  Acute Rehab PT Goals Patient Stated Goal: To go home.  PT Goal Formulation: With patient/family Time For Goal Achievement: 03/25/17 Potential to Achieve Goals: Good    Frequency BID   Barriers to discharge Decreased caregiver support Pt lives alone, but has 5 children who can be involved.     Co-evaluation               End of Session Equipment Utilized During Treatment: Gait belt Activity Tolerance: Patient tolerated treatment well Patient left: in chair;with call bell/phone within reach;with family/visitor present Nurse Communication: Mobility status (mobiltiy sheet left up in the  room. ) PT Visit Diagnosis: Muscle weakness (generalized) (M62.81);Other abnormalities of gait and mobility (R26.89)    Time: 0258-5277 PT Time Calculation (min) (ACUTE ONLY): 45 min   Charges:   PT Evaluation $PT Eval Low Complexity: 1 Procedure PT Treatments $Gait Training: 8-22 mins $Therapeutic Exercise: 8-22 mins   PT G Codes:   PT G-Codes **NOT FOR INPATIENT CLASS** Functional Assessment Tool Used: AM-PAC 6 Clicks Basic Mobility;Clinical judgement Functional Limitation: Mobility: Walking and moving around Mobility: Walking and Moving Around Current Status (O2423): At least 20 percent but less than 40 percent impaired, limited or restricted Mobility: Walking and Moving Around Goal Status [phone]): At least 1 percent but less than 20 percent impaired, limited or restricted    Beth Ruhi Kopke, PT, DPT X: [phone]

## 2017-03-18 NOTE — Anesthesia Procedure Notes (Signed)
Procedure Name: MAC Date/Time: 03/18/2017 8:22 AM Performed by: Andree Elk, AMY A Pre-anesthesia Checklist: Patient identified, Timeout performed, Emergency Drugs available, Suction available and Patient being monitored Oxygen Delivery Method: Simple face mask

## 2017-03-18 NOTE — Anesthesia Procedure Notes (Signed)
Spinal  Patient location during procedure: OR End time: 03/18/2017 7:37 AM Staffing Resident/CRNA: ADAMS, AMY A Preanesthetic Checklist Completed: patient identified, site marked, surgical consent, pre-op evaluation, timeout performed, IV checked, risks and benefits discussed and monitors and equipment checked Spinal Block Patient position: left lateral decubitus Prep: Betadine Patient monitoring: heart rate, cardiac monitor, continuous pulse ox and blood pressure Approach: left paramedian Location: L3-4 Injection technique: single-shot Needle Needle type: Spinocan  Needle gauge: 22 G Needle length: 9 cm Assessment Sensory level: T8 Additional Notes  ATTEMPTS:1 TRAY VA:[id number] TRAY EXPIRATION DATE:04/23/17

## 2017-03-18 NOTE — Transfer of Care (Signed)
Immediate Anesthesia Transfer of Care Note  Patient: Glenda Nolan  Procedure(s) Performed: Procedure(s): TOTAL KNEE ARTHROPLASTY (Left)  Patient Location: PACU  Anesthesia Type:Spinal  Level of Consciousness: awake, alert , oriented and patient cooperative  Airway & Oxygen Therapy: Patient Spontanous Breathing and Patient connected to nasal cannula oxygen  Post-op Assessment: Report given to RN and Post -op Vital signs reviewed and stable  Post vital signs: Reviewed and stable  Last Vitals:  Vitals:   03/18/17 0745 03/18/17 0800  BP: (!) 172/88 (!) 155/77  Resp: (!) 21 (!) 0  Temp:      Last Pain:  Vitals:   03/18/17 0702  TempSrc: Oral      Patients Stated Pain Goal: 8 (51/89/84 2103)  Complications: No apparent anesthesia complications

## 2017-03-18 NOTE — Op Note (Signed)
03/18/2017  11:01 AM  PATIENT:  Glenda Nolan  82 y.o. female  PRE-OPERATIVE DIAGNOSIS:  LEFT KNEE OSTEOARTHRITIS  POST-OPERATIVE DIAGNOSIS:  LEFT KNEE OSTEOARTHRITIS  67F 2.5T 12.5 POLY 38 P DEPUY FB PS TKA  SEVERE OA LATERAL COMPARTMENT WITH MODERATE VALGUS   1 HEMOVAC AND 30 CC MARCAINE WITH EPI AND 20CC DILUTED EXPAREL   BETTY ASHLEY ASSISTED  PROCEDURE:  Procedure(s): TOTAL KNEE ARTHROPLASTY (Left)  DICTATION OF PROCEDURE    The patient was identified in the preop holding area and the surgical site was confirmed as the left knee. Chart review and update were completed. The patient was taken to the operating room for spinal anesthesia. After successful spinal anesthesia Foley catheter was inserted. The patient was placed supine on the operating table. PREOPERATIVE ANTIBIOTICS WERE ADMINISTERED PER SCIP PROTOCOL WITH ANCEF     the left leg was prepped with DuraPrep and draped sterilely. Timeout was completed. The limb was then exsanguinated a  6 inch Esmarch. The tourniquet was elevated to 300 mmHg.   A midline incision was made and taken down to the extensor mechanism followed by medial arthrotomy. The patella was everted. A synovectomy was performed as needed. The osteophytes were resected.  Anterior cruciate ligament and PCL and medial and lateral meniscus were resected.  ORTHOALIGN WAS USED TO SET THE DISTAL FEMORAL CUT AT 1 DEGREE OF FLEXION AND 0 DEGREE MECHANICAL AXIS WITH A 9 MM DISTAL CUT DUE TO HYPEREXTENSION ON PREOP XRAYS AND PHYSICAL EXAM  This cut was completed and checked for flatness.   the femur was then measured to a size BETWEEN 2 AND 2.5 WE SET THE BLOCK TO A 2 .  The cutting block was placed to match the epicondyles and the 4 distal cuts were made.   THE ORTHOALIGN WAS USED TO SET THE TIBIAL CUT FOR 5 POST SLOPE AND 0 V/V WITH A DEPTH OF RESECTION OF 2 MM FROM THE LATERAL SIDE  the tibia was subluxated forward and the external alignment guide was placed.    THE TIBIAL RESECTION WAS MADE AND THE TIB SIZED TO A 2.5    spacer blocks were placed starting with a 10 mm insert to confirm equal flexion-extension gaps. A size  12.5  mm insert balanced the gaps. WE USED A LAMIANR SPREADER TO ASSESS THE KNEE IN EXTENSION AS WELL RELEASING SOFT TISSUE LATERAL TO OBTAIN RECTANGULAR GAP   We placed the femoral notch cutting guide size 2  and resected the notch.   Trial implants were placed using appropriate size femur , appropriate size tibial baseplate which was measured after the proximal tibia resection. Tibial rotation was set patella tracking was normal   The tibia was then punched per manufacture technique making sure to avoid internal rotation.   The patella measured a size 21   We resected down to a size 13 using a size 38 X 8.5 button.   Final range of motion check was performed with the appropriate size trials as mentioned above. Satisfactory reduction and motion were obtained.   Trial implants were removed. The bone was irrigated and dried and the cement was mixed on the back table  exparel was injected in the soft tissues and posterior capsule of the knee  These implants were then cemented in place. Excess cement was removed. The cement was allowed to cure. Second irrigation was performed.    FInal range of motion check and stability check was completed  The wound was irrigated third time Hemovac  drain was placed, extensor mechanism was closed with #1 Nurolon followed by 0 Monocryl and staples to reapproximate the skin edges and subcutaneous tissue.   Sterile dressing was applied  The patient was taken recovery in stable condition   SURGEON:  Surgeon(s) and Role:    * Carole Civil, MD - Primary   ANESTHESIA:   spinal  EBL:  Total I/O In: 1200 [I.V.:1200] Out: 625 [Urine:600; Blood:25]  BLOOD ADMINISTERED:none  COUNTS:  YES  TOURNIQUET:   Total Tourniquet Time Documented: Thigh (Left) - -[id number] minutes Total: Thigh  (Left) - -[id number] minutes   DICTATION: .Viviann Spare Dictation  PLAN OF CARE: Admit to inpatient   PATIENT DISPOSITION:  PACU - hemodynamically stable.   Delay start of Pharmacological VTE agent (>24hrs) due to surgical blood loss or risk of bleeding: yes  [phone]

## 2017-03-18 NOTE — Anesthesia Postprocedure Evaluation (Signed)
Anesthesia Post Note  Patient: Glenda Nolan  Procedure(s) Performed: Procedure(s) (LRB): TOTAL KNEE ARTHROPLASTY (Left)  Patient location during evaluation: PACU Anesthesia Type: Spinal Level of consciousness: awake and alert and oriented Pain management: pain level controlled Vital Signs Assessment: post-procedure vital signs reviewed and stable Respiratory status: spontaneous breathing and patient connected to face mask oxygen Cardiovascular status: stable Postop Assessment: no signs of nausea or vomiting and spinal receding Anesthetic complications: no     Last Vitals:  Vitals:   03/18/17 0745 03/18/17 0800  BP: (!) 172/88 (!) 155/77  Resp: (!) 21 (!) 0  Temp:      Last Pain:  Vitals:   03/18/17 0702  TempSrc: Oral                 ADAMS, AMY A

## 2017-03-18 NOTE — Care Management Note (Signed)
Case Management Note  Patient Details  Name: Glenda Nolan MRN: [id number] Date of Birth: 1935-05-16  Subjective/Objective:   TKR today. From home alone, recommended for [facility] PT.   Patient agreeable to [facility] PT, would like [facility]. Patient would also like [facility] RN.          Action/Plan: Family plans to arrange for 24 hour supervision. Patient has RW PTA. CPM will need to be ordered. [facility] can accept patient's insurance. Will fax order when available. Tim of [facility] notified of need for [facility] PT and [place] RN.    Expected Discharge Date:       03/19/2017           Expected Discharge Plan:  [place]  In-House Referral:     Discharge planning Services  CM Consult  Post Acute Care Choice:  Durable Medical Equipment, Home Health Choice offered to:  Patient  DME Arranged:  CPM DME Agency:     HH Arranged:  RN, PT [place] Agency:  Other - See comment ([place])  Status of Service:  In process, will continue to follow  If discussed at Long Length of Stay Meetings, dates discussed:    Additional Comments:  Aina Rossbach, Chauncey [place], RN 03/18/2017, 3:54 PM

## 2017-03-18 NOTE — Anesthesia Preprocedure Evaluation (Signed)
Anesthesia Evaluation  Patient identified by MRN, date of birth, ID band Patient awake    Reviewed: Allergy & Precautions, H&P , NPO status , Patient's Chart, lab work & pertinent test results, reviewed documented beta blocker date and time   Airway Mallampati: III  TM Distance: >3 FB     Dental  (+) Edentulous Upper   Pulmonary former smoker,    breath sounds clear to auscultation       Cardiovascular hypertension, Pt. on medications and Pt. on home beta blockers  Rhythm:Regular Rate:Normal     Neuro/Psych  Headaches,    GI/Hepatic GERD  Medicated and Controlled,  Endo/Other  Hypothyroidism   Renal/GU      Musculoskeletal  (+) Arthritis ,   Abdominal   Peds  Hematology   Anesthesia Other Findings   Reproductive/Obstetrics                             Anesthesia Physical Anesthesia Plan  ASA: III  Anesthesia Plan: Spinal   Post-op Pain Management:    Induction: Intravenous  Airway Management Planned: Simple Face Mask  Additional Equipment:   Intra-op Plan:   Post-operative Plan:   Informed Consent: I have reviewed the patients History and Physical, chart, labs and discussed the procedure including the risks, benefits and alternatives for the proposed anesthesia with the patient or authorized representative who has indicated his/her understanding and acceptance.     Plan Discussed with:   Anesthesia Plan Comments:         Anesthesia Quick Evaluation

## 2017-03-18 NOTE — Brief Op Note (Signed)
03/18/2017  11:01 AM  PATIENT:  Glenda Nolan  82 y.o. female  PRE-OPERATIVE DIAGNOSIS:  LEFT KNEE OSTEOARTHRITIS  POST-OPERATIVE DIAGNOSIS:  LEFT KNEE OSTEOARTHRITIS  73F 2.5T 12.5 POLY 38 P DEPUY FB PS TKA  SEVERE OA LATERAL COMPARTMENT WITH MODERATE VALGUS   1 HEMOVAC AND 30 CC MARCAINE WITH EPI AND 20CC DILUTED EXPAREL   BETTY ASHLEY ASSISTED  PROCEDURE:  Procedure(s): TOTAL KNEE ARTHROPLASTY (Left)  DICTATION OF PROCEDURE    The patient was identified in the preop holding area and the surgical site was confirmed as the left knee. Chart review and update were completed. The patient was taken to the operating room for spinal anesthesia. After successful spinal anesthesia Foley catheter was inserted. The patient was placed supine on the operating table. PREOPERATIVE ANTIBIOTICS WERE ADMINISTERED PER SCIP PROTOCOL WITH ANCEF     the left leg was prepped with DuraPrep and draped sterilely. Timeout was completed. The limb was then exsanguinated a  6 inch Esmarch. The tourniquet was elevated to 300 mmHg.   A midline incision was made and taken down to the extensor mechanism followed by medial arthrotomy. The patella was everted. A synovectomy was performed as needed. The osteophytes were resected.  Anterior cruciate ligament and PCL and medial and lateral meniscus were resected.  ORTHOALIGN WAS USED TO SET THE DISTAL FEMORAL CUT AT 1 DEGREE OF FLEXION AND 0 DEGREE MECHANICAL AXIS WITH A 9 MM DISTAL CUT DUE TO HYPEREXTENSION ON PREOP XRAYS AND PHYSICAL EXAM  This cut was completed and checked for flatness.   the femur was then measured to a size BETWEEN 2 AND 2.5 WE SET THE BLOCK TO A 2 .  The cutting block was placed to match the epicondyles and the 4 distal cuts were made.   THE ORTHOALIGN WAS USED TO SET THE TIBIAL CUT FOR 5 POST SLOPE AND 0 V/V WITH A DEPTH OF RESECTION OF 2 MM FROM THE LATERAL SIDE  the tibia was subluxated forward and the external alignment guide was placed.    THE TIBIAL RESECTION WAS MADE AND THE TIB SIZED TO A 2.5    spacer blocks were placed starting with a 10 mm insert to confirm equal flexion-extension gaps. A size  12.5  mm insert balanced the gaps. WE USED A LAMIANR SPREADER TO ASSESS THE KNEE IN EXTENSION AS WELL RELEASING SOFT TISSUE LATERAL TO OBTAIN RECTANGULAR GAP   We placed the femoral notch cutting guide size 2  and resected the notch.   Trial implants were placed using appropriate size femur , appropriate size tibial baseplate which was measured after the proximal tibia resection. Tibial rotation was set patella tracking was normal   The tibia was then punched per manufacture technique making sure to avoid internal rotation.   The patella measured a size 21   We resected down to a size 13 using a size 38 X 8.5 button.   Final range of motion check was performed with the appropriate size trials as mentioned above. Satisfactory reduction and motion were obtained.   Trial implants were removed. The bone was irrigated and dried and the cement was mixed on the back table  exparel was injected in the soft tissues and posterior capsule of the knee  These implants were then cemented in place. Excess cement was removed. The cement was allowed to cure. Second irrigation was performed.    FInal range of motion check and stability check was completed  The wound was irrigated third time Hemovac  drain was placed, extensor mechanism was closed with #1 Nurolon followed by 0 Monocryl and staples to reapproximate the skin edges and subcutaneous tissue.   Sterile dressing was applied  The patient was taken recovery in stable condition   SURGEON:  Surgeon(s) and Role:    * Carole Civil, MD - Primary   ANESTHESIA:   spinal  EBL:  Total I/O In: 1200 [I.V.:1200] Out: 625 [Urine:600; Blood:25]  BLOOD ADMINISTERED:none  COUNTS:  YES  TOURNIQUET:   Total Tourniquet Time Documented: Thigh (Left) - -103013 minutes Total: Thigh  (Left) - -143888 minutes   DICTATION: .Viviann Spare Dictation  PLAN OF CARE: Admit to inpatient   PATIENT DISPOSITION:  PACU - hemodynamically stable.   Delay start of Pharmacological VTE agent (>24hrs) due to surgical blood loss or risk of bleeding: yes  [phone]

## 2017-03-18 NOTE — Interval H&P Note (Signed)
History and Physical Interval Note:  03/18/2017 8:09 AM  Glenda Nolan  has presented today for surgery, with the diagnosis of LEFT KNEE OSTEOARTHRITIS  The various methods of treatment have been discussed with the patient and family. After consideration of risks, benefits and other options for treatment, the patient has consented to  Procedure(s): TOTAL KNEE ARTHROPLASTY (Left) as a surgical intervention .  The patient's history has been reviewed, patient examined, no change in status, stable for surgery.  I have reviewed the patient's chart and labs.  Questions were answered to the patient's satisfaction.     Arther Abbott

## 2017-03-19 ENCOUNTER — Other Ambulatory Visit: Payer: Self-pay | Admitting: *Deleted

## 2017-03-19 DIAGNOSIS — Z96652 Presence of left artificial knee joint: Secondary | ICD-10-CM

## 2017-03-19 LAB — CBC
HCT: 36.5 % (ref 36.0–46.0)
Hemoglobin: 12.7 g/dL (ref 12.0–15.0)
MCH: 31.8 pg (ref 26.0–34.0)
MCHC: 34.8 g/dL (ref 30.0–36.0)
MCV: 91.3 fL (ref 78.0–100.0)
Platelets: 326 10*3/uL (ref 150–400)
RBC: 4 MIL/uL (ref 3.87–5.11)
RDW: 12.9 % (ref 11.5–15.5)
WBC: 12.4 10*3/uL — ABNORMAL HIGH (ref 4.0–10.5)

## 2017-03-19 LAB — BASIC METABOLIC PANEL
Anion gap: 11 (ref 5–15)
BUN: 6 mg/dL (ref 6–20)
CO2: 23 mmol/L (ref 22–32)
Calcium: 7.6 mg/dL — ABNORMAL LOW (ref 8.9–10.3)
Chloride: 93 mmol/L — ABNORMAL LOW (ref 101–111)
Creatinine, Ser: 0.68 mg/dL (ref 0.44–1.00)
GFR calc Af Amer: >60 mL/min (ref >60)
GFR calc non Af Amer: >60 mL/min (ref >60)
Glucose, Bld: 195 mg/dL — ABNORMAL HIGH (ref 65–99)
Potassium: 3.2 mmol/L — ABNORMAL LOW (ref 3.5–5.1)
Sodium: 127 mmol/L — ABNORMAL LOW (ref 135–145)

## 2017-03-19 MED ORDER — HALOPERIDOL LACTATE 2 MG/ML PO CONC: 0.5000 mg | Freq: Four times a day (QID) | ORAL | Status: DC | PRN

## 2017-03-19 MED ORDER — HALOPERIDOL LACTATE 2 MG/ML PO CONC
1.0000 mg | Freq: Four times a day (QID) | ORAL | Status: DC | PRN
  Filled 2017-03-19: qty 0.5

## 2017-03-19 MED ORDER — POTASSIUM CHLORIDE CRYS ER 20 MEQ PO TBCR
20.0000 meq | EXTENDED_RELEASE_TABLET | Freq: Two times a day (BID) | ORAL | Status: DC
  Administered 2017-03-19 – 2017-03-20 (×3): 20 meq via ORAL
  Filled 2017-03-19 (×3): qty 1

## 2017-03-19 MED ORDER — HALOPERIDOL LACTATE 5 MG/ML IJ SOLN
0.5000 mg | Freq: Four times a day (QID) | INTRAMUSCULAR | Status: DC | PRN
  Administered 2017-03-20: 0.5 mg via INTRAVENOUS
  Filled 2017-03-19: qty 1

## 2017-03-19 MED ORDER — HYDROCODONE-ACETAMINOPHEN 5-325 MG PO TABS
1.0000 | ORAL_TABLET | ORAL | Status: DC | PRN
  Administered 2017-03-19 – 2017-03-20 (×4): 1 via ORAL
  Filled 2017-03-19 (×4): qty 1

## 2017-03-19 NOTE — Anesthesia Postprocedure Evaluation (Signed)
Anesthesia Post Note  Patient: Glenda Nolan  Procedure(s) Performed: Procedure(s) (LRB): TOTAL KNEE ARTHROPLASTY (Left)  Patient location during evaluation: Nursing Unit Anesthesia Type: Spinal Level of consciousness: awake, confused and patient cooperative Pain management: pain level controlled Vital Signs Assessment: post-procedure vital signs reviewed and stable Respiratory status: spontaneous breathing, nonlabored ventilation and respiratory function stable Cardiovascular status: blood pressure returned to baseline Postop Assessment: no signs of nausea or vomiting Anesthetic complications: no     Last Vitals:  Vitals:   03/18/17 2108 03/19/17 0500  BP: 127/65 113/61  Pulse: 85 98  Resp: 18 18  Temp: 36.7 C 36.6 C    Last Pain:  Vitals:   03/19/17 1330  TempSrc:   PainSc: 4                  Daxson Reffett J

## 2017-03-19 NOTE — Progress Notes (Signed)
Pt had an episode of vomiting. IV zofran and pain medication given. Will continue to monitor.

## 2017-03-19 NOTE — Progress Notes (Signed)
Patient got out of bed unassisted and pulled IV and Hemovac out. Patient confused to time and place., reoriented and explained the need to call for assistance and not get up alone. IV restarted. MD made aware of patient removing Hemovac.

## 2017-03-19 NOTE — Progress Notes (Signed)
Physical Therapy Treatment Patient Details Name: Glenda Nolan MRN: [id number] DOB: 1935-09-13 Today's Date: 03/19/2017    History of Present Illness 82 yo F s/p L TKA    PT Comments    Pt received sitting up in the chair with dtr present.  Pt is agreeable to PT tx.  She seems confused today, and is fidgety, and impulsive.  Pt required Min guard for sit<>stand with RW, and Min guard/Min A for gait in order to improve to step through pattern.  She has a tendency to only perform step to pattern.  Continue to recommend that she d/c home with HHPT, however, strongly recommend 24/7 supervision/assistance, which was discussed with dtr.   # OF FEET WALKED: 80 ROM:  Flexion: 85*            Extension: -2*   Follow Up Recommendations  Home health PT;Supervision/Assistance - 24 hour (discussed this again with dtr)     Equipment Recommendations  Rolling walker with 5" wheels    Recommendations for Other Services       Precautions / Restrictions Precautions Precautions: Fall Precaution Comments: 1 last week and increased confusion today.    Restrictions Weight Bearing Restrictions: Yes RUE Weight Bearing: Weight bearing as tolerated LUE Weight Bearing: Weight bearing as tolerated RLE Weight Bearing: Weight bearing as tolerated LLE Weight Bearing: Weight bearing as tolerated    Mobility  Bed Mobility                  Transfers Overall transfer level: Needs assistance Equipment used: Rolling walker (2 wheeled) Transfers: Sit to/from Stand Sit to Stand: Min guard         General transfer comment: increased time  Ambulation/Gait Ambulation/Gait assistance: Min guard;Min assist Ambulation Distance (Feet): 80 Feet Assistive device: Rolling walker (2 wheeled) Gait Pattern/deviations: Step-to pattern   Gait velocity interpretation: <1.8 ft/sec, indicative of risk for recurrent falls General Gait Details: Pt requires vc's for increased stride length on the right foot.   Improved step through pattern when pt is given Min A for forward and lateral weight shifting.  She also improves step through pattern when PT assists with RW navigation with a continous forward propulsion.   Stairs            Wheelchair Mobility    Modified Rankin (Stroke Patients Only)       Balance Overall balance assessment: History of Falls;Needs assistance Sitting-balance support: Bilateral upper extremity supported;Feet supported Sitting balance-Leahy Scale: Good     Standing balance support: Bilateral upper extremity supported Standing balance-Leahy Scale: Fair                              Cognition Arousal/Alertness: Awake/alert Behavior During Therapy: Impulsive Overall Cognitive Status: Impaired/Different from baseline Area of Impairment: Orientation                 Orientation Level: Time             General Comments: Pt is unaware that it is still morning.  Pt is very fidgity today, and attempting to get up out of the bed and out of the chair without assistance.        Exercises      General Comments        Pertinent Vitals/Pain Pain Assessment: Faces Faces Pain Scale: Hurts little more Pain Intervention(s): Limited activity within patient's tolerance;Monitored during session;Repositioned    Home Living  Prior Function            PT Goals (current goals can now be found in the care plan section) Acute Rehab PT Goals Patient Stated Goal: To go home.  PT Goal Formulation: With patient/family Time For Goal Achievement: 03/25/17 Potential to Achieve Goals: Good Progress towards PT goals: Progressing toward goals    Frequency    BID      PT Plan Current plan remains appropriate    Co-evaluation             End of Session Equipment Utilized During Treatment: Gait belt Activity Tolerance: Patient tolerated treatment well Patient left: in chair;with call bell/phone within  reach;with family/visitor present;with chair alarm set Nurse Communication: Mobility status PT Visit Diagnosis: Muscle weakness (generalized) (M62.81);Other abnormalities of gait and mobility (R26.89)     Time: 0938-1829 PT Time Calculation (min) (ACUTE ONLY): 32 min  Charges:  $Gait Training: 8-22 mins $Therapeutic Exercise: 8-22 mins                    G Codes:       Beth Bea Duren, PT, DPT X: [phone]

## 2017-03-19 NOTE — Progress Notes (Signed)
Physical Therapy Treatment Patient Details Name: Glenda Nolan MRN: [id number] DOB: 02-02-1935 Today's Date: 03/19/2017    History of Present Illness 82 yo F s/p L TKA    PT Comments    Pt received in bed, and was agreeable to PT tx.  Dtr's state that she had just gotten back in the bed after being up in the chair all day.  During trial of sit<>stand, pt demonstrates a change in assistance previously required.  She now requires Mod/Max A for attempt x 3 of sit<>stand, however she was not able to come up into full standing posture and demonstrates strong posterior lean.  Pt also continues to be confused, and is seeing people in the room who are not there.  She also seems to be speaking non-sensically at times.  Strength is equal when tested in B LE's, smile is normal, however, pt is not able to understand commands to hold UE's out in front.  Pt required Max A for sit<>supine due to poor understanding of commands.  Both dtr's are present and seem to think that continued confusion may be due to pt receiving more pain medication than she is used to.  I have changed my recommendations to SNF at this time, however pt and family would really like for her to return home.  Expressed to pt and family that if she went home she would need strict 24/7 supervision/assistance.  Reported findings and change in d/c plan to RN.   Follow Up Recommendations  Home health PT;Supervision/Assistance - 24 hour (Discussed with two dtr's that she would need strict 24/7 supervision/assistance if she went home. )     Equipment Recommendations  Rolling walker with 5" wheels    Recommendations for Other Services       Precautions / Restrictions Precautions Precautions: Fall Precaution Comments: 1 last week and increased confusion today.       Mobility  Bed Mobility Overal bed mobility: Needs Assistance Bed Mobility: Supine to Sit;Sit to Supine     Supine to sit: Min assist Sit to supine: Max assist (Pt had great  difficulty following commands to perform this task.)      Transfers Overall transfer level: Needs assistance Equipment used: Rolling walker (2 wheeled) Transfers: Sit to/from Stand Sit to Stand: Max assist;Mod assist         General transfer comment: Attempt x 3  trials, however pt was not able to come into full upright standing position, with strong posterior lean.    Ambulation/Gait Ambulation/Gait assistance:  (Unable to ambulate this PM)               Stairs            Wheelchair Mobility    Modified Rankin (Stroke Patients Only)       Balance Overall balance assessment: Needs assistance Sitting-balance support: Bilateral upper extremity supported;Feet supported Sitting balance-Leahy Scale: Fair                                      Cognition Arousal/Alertness: Lethargic Behavior During Therapy: Impulsive;Restless                                   General Comments: Pt continues to be fidgety, and has difficulty following commands.  Pt is also noted to be seeing other people who are not  currently in the room, and at times she seems to have nonsensical speech.       Exercises      General Comments        Pertinent Vitals/Pain Faces Pain Scale: Hurts a little bit    Home Living                      Prior Function            PT Goals (current goals can now be found in the care plan section) Acute Rehab PT Goals Patient Stated Goal: To go home.  PT Goal Formulation: With patient/family Time For Goal Achievement: 03/25/17 Potential to Achieve Goals: Good Progress towards PT goals: Not progressing toward goals - comment (limited due to confusion)    Frequency    BID      PT Plan Discharge plan needs to be updated    Co-evaluation             End of Session Equipment Utilized During Treatment: Gait belt Activity Tolerance: Patient limited by fatigue Patient left: in bed;with call  bell/phone within reach;in CPM   PT Visit Diagnosis: Muscle weakness (generalized) (M62.81);Other abnormalities of gait and mobility (R26.89)     Time: 3491-7915 PT Time Calculation (min) (ACUTE ONLY): 32 min  Charges:  $Therapeutic Activity: 23-37 mins                    G Codes:       Beth Aarya Quebedeaux, PT, DPT X: [phone]

## 2017-03-19 NOTE — Clinical Social Work Note (Signed)
Patient is agreeable to HHPT.    LCSW signing off as patient has not CSW needs.       Austin Pongratz, Clydene Pugh, LCSW

## 2017-03-19 NOTE — Progress Notes (Addendum)
Patient ID: Glenda Nolan, female   DOB: 01-08-1935, 82 y.o.   MRN: [id number]  POD # 1  Status post left total knee   VS BP 113/61 (BP Location: Left Arm)   Pulse 98   Temp 97.9 F (36.6 C) (Oral)   Resp 18   SpO2 95%    LABS  CBC Latest Ref Rng & Units 03/19/2017 03/11/2017 07/27/2013  WBC 4.0 - 10.5 K/uL 12.4(H) 7.9 -  Hemoglobin 12.0 - 15.0 g/dL 12.7 13.7 13.9  Hematocrit 36.0 - 46.0 % 36.5 39.9 40.7  Platelets 150 - 400 K/uL 326 253 -   BMP Latest Ref Rng & Units 03/19/2017 03/11/2017 07/27/2013  Glucose 65 - 99 mg/dL 195(H) 139(H) 95  BUN 6 - 20 mg/dL 6 8 9   Creatinine 0.44 - 1.00 mg/dL 0.68 0.86 0.82  Sodium 135 - 145 mmol/L 127(L) 134(L) 135  Potassium 3.5 - 5.1 mmol/L 3.2(L) 3.6 4.0  Chloride 101 - 111 mmol/L 93(L) 99(L) 94(L)  CO2 22 - 32 mmol/L 23 27 30   Calcium 8.9 - 10.3 mg/dL 7.6(L) 9.0 9.5     DRESSING dry, patient did get confused and pulled out her Hemovac   Neuro-vasculo-motor status of operative limb normal PT notes indicate 85 of knee flexion and near full extension with 20 feet of gait  Decrease dose on hydrocodone. Remove Robaxin. Use morphine as needed for severe pain  Continue physical therapy  Add 20 mEq of potassium daily  Check potassium tomorrow  Homans sign normal calf supple  Assessment status post total knee arthroplasty patient doing well  Plan continue physical therapy. Provide adequate pain control.

## 2017-03-19 NOTE — Addendum Note (Signed)
Addendum  created 03/19/17 1439 by Charmaine Downs, CRNA   Sign clinical note

## 2017-03-19 NOTE — Progress Notes (Signed)
Pt is refusing the CPM machine. Wore for about 15 minutes and she requested it off. Will make MD aware. Will continue to monitor. Pt still also confused at times.

## 2017-03-20 ENCOUNTER — Encounter (HOSPITAL_COMMUNITY): Payer: Self-pay | Admitting: Orthopedic Surgery

## 2017-03-20 LAB — CBC
HCT: 28.5 % — ABNORMAL LOW (ref 36.0–46.0)
Hemoglobin: 10 g/dL — ABNORMAL LOW (ref 12.0–15.0)
MCH: 31.1 pg (ref 26.0–34.0)
MCHC: 35.1 g/dL (ref 30.0–36.0)
MCV: 88.5 fL (ref 78.0–100.0)
Platelets: 250 10*3/uL (ref 150–400)
RBC: 3.22 MIL/uL — ABNORMAL LOW (ref 3.87–5.11)
RDW: 12.8 % (ref 11.5–15.5)
WBC: 10.2 10*3/uL (ref 4.0–10.5)

## 2017-03-20 LAB — POTASSIUM: Potassium: 3.1 mmol/L — ABNORMAL LOW (ref 3.5–5.1)

## 2017-03-20 MED ORDER — HYDROCODONE-ACETAMINOPHEN 5-325 MG PO TABS: 1.0000 | ORAL_TABLET | ORAL | 0 refills | Status: DC | PRN

## 2017-03-20 MED ORDER — DOCUSATE SODIUM 100 MG PO CAPS: 100.0000 mg | ORAL_CAPSULE | Freq: Two times a day (BID) | ORAL | 0 refills | Status: DC

## 2017-03-20 MED ORDER — POTASSIUM CHLORIDE CRYS ER 20 MEQ PO TBCR: 20.0000 meq | EXTENDED_RELEASE_TABLET | Freq: Two times a day (BID) | ORAL | 0 refills | Status: DC

## 2017-03-20 MED ORDER — ASPIRIN 325 MG PO TBEC: 325.0000 mg | DELAYED_RELEASE_TABLET | Freq: Every day | ORAL | 0 refills | Status: AC

## 2017-03-20 NOTE — Care Management Note (Addendum)
Case Management Note  Patient Details  Name: Glenda Nolan MRN: [id number] Date of Birth: 10-20-1935  Expected Discharge Date:      03/20/2017            Expected Discharge Plan:  [place]  In-House Referral:  NA  Discharge planning Services  CM Consult  Post Acute Care Choice:  Durable Medical Equipment, Home Health Choice offered to:  Patient  DME Arranged:  CPM, 3-N-1 DME Agency:  [facility]:  OT/PT/aid HH Agency:  [facility]  Status of Service:  Completed, signed off  Additional Comments: Pt discharging home today with [facility] services through [facility]. Tim, [facility] rep, aware of DC today. Pt's CPM and BSC has been referred to [facility] medical. Pt instructed to call [facility] when she gets home and they will bring DME to her home. Pt's daughters planning for 24/7 supervision. Daughters at bedside for DC plan discussion.   Sherald Barge, RN 03/20/2017, 11:53 AM

## 2017-03-20 NOTE — Care Management Important Message (Signed)
Important Message  Patient Details  Name: KAMIAH FITE MRN: [id number] Date of Birth: [birth year]   Medicare Important Message Given:  Yes    Sherald Barge, RN 03/20/2017, 11:56 AM

## 2017-03-20 NOTE — Evaluation (Signed)
Occupational Therapy Evaluation Patient Details Name: Glenda Nolan MRN: [id number] DOB: 16-Feb-1935 Today's Date: 03/20/2017    History of Present Illness 82 yo F s/p L TKA   Clinical Impression   Patient walking to bathroom with nursing staff upon therapy arrival with daughter present. Patient agreeable to participate in OT evaluation and requesting to wash up. Patient presents with UB weakness and requires increased assistance to complete ADL tasks. Recommend that patient have assistance at least for the 1st week at home from family. Recommended that patient use a long handled sponge and reaching to increase safety during bathing and daily tasks. Daughter verbalized understanding. Daughter states that she does not have a BSC and OT recommended one for use at night. Case management was informed of DME and HH OT recommendations.     Follow Up Recommendations  Home health OT    Equipment Recommendations  3 in 1 bedside commode       Precautions / Restrictions Precautions Precautions: Fall Precaution Comments: 1 last week and post op.    Restrictions Weight Bearing Restrictions: No      Mobility                   Transfers Overall transfer level: Needs assistance Equipment used: Rolling walker (2 wheeled) Transfers: Sit to/from Stand Sit to Stand: Mod assist         General transfer comment: From toilet using RW and grab bar        ADL either performed or assessed with clinical judgement   ADL Overall ADL's : Needs assistance/impaired     Grooming: Wash/dry face;Applying deodorant;Set up;Sitting   Upper Body Bathing: Set up;Sitting   Lower Body Bathing: Min guard;Sit to/from stand     Upper Body Dressing Details (indicate cue type and reason): Donned hospital gone with therapist assisting.   Lower Body Dressing Details (indicate cue type and reason): Not completed.  Toilet Transfer: Moderate assistance;Grab bars;RW Armed forces technical officer Details (indicate cue  type and reason): Due to low height of toilet, patient required Mod Assist to complete sit to stand.          Functional mobility during ADLs: Min guard;Rolling walker                    Pertinent Vitals/Pain Pain Assessment: Faces Faces Pain Scale: Hurts a little bit Pain Location: left knee Pain Intervention(s): Ice applied     Hand Dominance Right   Extremity/Trunk Assessment Upper Extremity Assessment Upper Extremity Assessment: Generalized weakness RUE Deficits / Details: Shoulder flexion is limited which is baseline/chronic.   Lower Extremity Assessment Lower Extremity Assessment: Defer to PT evaluation       Communication Communication Communication: No difficulties   Cognition Arousal/Alertness: Awake/alert Behavior During Therapy: WFL for tasks assessed/performed Overall Cognitive Status: Within Functional Limits for tasks assessed                                                Home Living Family/patient expects to be discharged to:: Private residence Living Arrangements: Alone Available Help at Discharge: Family Type of Home: Apartment Home Access: Ramped entrance     Home Layout: One level     Bathroom Shower/Tub: Teacher, early years/pre: Standard     Home Equipment: Cane - single point;Walker - 4 wheels;Walker - standard   Additional  Comments: Daughter present today states that she does not have a BSC.      Prior Functioning/Environment    Gait / Transfers Assistance Needed: Pt normally ambulates with rollator.   ADL's / Homemaking Assistance Needed: independent with both dressing and bathing.     Comments: children assist with driving and running errands.  Pt states she ambulates around the grocery store with the shopping cart.          OT Problem List: Decreased strength;Decreased knowledge of use of DME or AE                          End of Session Equipment Utilized During Treatment: Gait  belt;Rolling walker CPM Left Knee CPM Left Knee: Off Nurse Communication: Mobility status  Activity Tolerance: Patient tolerated treatment well Patient left: in chair;with call bell/phone within reach;with chair alarm set;with family/visitor present  OT Visit Diagnosis: Muscle weakness (generalized) (M62.81)                Time: 7096-2836 OT Time Calculation (min): 29 min Charges:  OT General Charges $OT Visit: 1 Procedure OT Evaluation $OT Eval Low Complexity: 1 Procedure G-Codes:     Ailene Ravel, OTR/L,CBIS  [phone]   Besan Ketchem, Clarene Duke 03/20/2017, 9:33 AM

## 2017-03-20 NOTE — Progress Notes (Signed)
Physical Therapy Treatment Patient Details Name: Glenda Nolan MRN: [id number] DOB: [birth year] Today's Date: 03/20/2017    History of Present Illness 82 yo F s/p L TKA    PT Comments    Pt received sitting up in the chair, and was agreeable to PT tx.  Pt demonstrated much improved cognition today, and was able to ambulate 12ft with RW with improved gait quality and smooth reciprocal step through pattern.  She continues to need cues for controlled eccentric contraction for stand<>sit and not to plop down in the chair.  Recommendations for HHPT and 24/7 supervision/assistance.    # OF FEET WALKED: 138ft ROM:  Flexion: 80*            Extension: 0*   Follow Up Recommendations  Home health PT;Supervision/Assistance - 24 hour (continue recommendation for strict 24/7 supervision/assistance. )     Equipment Recommendations  Rolling walker with 5" wheels;3in1 (PT)    Recommendations for Other Services       Precautions / Restrictions Precautions Precautions: Fall Precaution Comments: 1 last week and post op.    Restrictions Weight Bearing Restrictions: No    Mobility  Bed Mobility Overal bed mobility: Needs Assistance Bed Mobility: Sit to Supine     Supine to sit: Modified independent (Device/Increase time)     General bed mobility comments: increased time, bed flat.   Transfers Overall transfer level: Needs assistance Equipment used: Rolling walker (2 wheeled) Transfers: Sit to/from Stand Sit to Stand: Min guard         General transfer comment: Pt requires increased cues for hand placement, and weight shift forward.  Pt was able to complete with min guard with 1 hand on the RW, and 1 hand on the chair.  Pt continues to demonstrate poor eccentric control for stand<>sit despite cues for slowly lowering into the chair/bed.   Ambulation/Gait Ambulation/Gait assistance: Min guard;Modified independent (Device/Increase time) Ambulation Distance (Feet): 100 Feet Assistive  device: Rolling walker (2 wheeled) Gait Pattern/deviations: Step-through pattern     General Gait Details: Improved cognition with improved reciprocal step through pattern.     Stairs            Wheelchair Mobility    Modified Rankin (Stroke Patients Only)       Balance Overall balance assessment: Needs assistance Sitting-balance support: Bilateral upper extremity supported;Feet supported Sitting balance-Leahy Scale: Fair     Standing balance support: Bilateral upper extremity supported Standing balance-Leahy Scale: Fair                              Cognition Arousal/Alertness: Awake/alert Behavior During Therapy: WFL for tasks assessed/performed Overall Cognitive Status: Within Functional Limits for tasks assessed                                 General Comments: Cognition is much improved from yesterday.        Exercises      General Comments        Pertinent Vitals/Pain Pain Assessment: Faces Faces Pain Scale: Hurts a little bit Pain Location: left knee Pain Descriptors / Indicators: Aching Pain Intervention(s): Limited activity within patient's tolerance;Monitored during session;Repositioned    Home Living Family/patient expects to be discharged to:: Private residence Living Arrangements: Alone Available Help at Discharge: Family Type of Home: Apartment Home Access: Ramped entrance   Home Layout: One level Home Equipment:  Cane - single point;Walker - 4 wheels;Walker - standard Additional Comments: Daughter present today states that she does not have a BSC.    Prior Function    Gait / Transfers Assistance Needed: Pt normally ambulates with rollator.   ADL's / Homemaking Assistance Needed: independent with both dressing and bathing.   Comments: children assist with driving and running errands.  Pt states she ambulates around the grocery store with the shopping cart.     PT Goals (current goals can now be found in the  care plan section) Acute Rehab PT Goals Patient Stated Goal: To go home.  PT Goal Formulation: With patient/family Time For Goal Achievement: 03/25/17 Potential to Achieve Goals: Good Progress towards PT goals: Progressing toward goals    Frequency    BID      PT Plan Discharge plan needs to be updated    Co-evaluation             End of Session Equipment Utilized During Treatment: Gait belt Activity Tolerance: Patient tolerated treatment well Patient left: in bed;in CPM;with call bell/phone within reach;with family/visitor present Nurse Communication: Mobility status PT Visit Diagnosis: Muscle weakness (generalized) (M62.81);Other abnormalities of gait and mobility (R26.89)     Time: 1594-7076 PT Time Calculation (min) (ACUTE ONLY): 30 min  Charges:  $Gait Training: 8-22 mins $Therapeutic Activity: 8-22 mins                    G Codes:       Beth Krysten Veronica, PT, DPT X: [phone]

## 2017-03-20 NOTE — Progress Notes (Signed)
Pt IV removed, tolerated well.  Reviewed discharge instructions with pt and family at bedside.  Answered all questions at this time.   

## 2017-03-20 NOTE — Discharge Summary (Signed)
Physician Discharge Summary  Patient ID: Glenda Nolan MRN: [id number] DOB/AGE: [birth year] 80 y.o.  Admit date: 03/18/2017 Discharge date: 03/20/2017  Admission Diagnoses: left knee primary OA Discharge Diagnoses: left knee primary OA  Hypokalemia Post op confusion due to opioid effect  Active Problems:   S/P TKR (total knee replacement), left   Discharged Condition: good  Hospital Course:  03/18/17 HD 1 LEFT TKA  / SPINAL /  DEPUY FB TKA PS 17F 2.5 T  12.5 POLY 38 P  03/19/17 HD 2 -HYPOKALEMIA , CONFUSION / ROM 85 DEGREES 03/20/17       Discharge Exam: Blood pressure (!) 108/55, pulse 84, temperature 98.8 F (37.1 C), temperature source Oral, resp. rate 18, SpO2 98 %. Gen. the patient appeared awake alert and oriented to day follow commands well  She exhibited no signs of mental status changes or opioid-induced confusion  Her wound was clean dry and intact her cuff is supple her Homans sign was negative she had no peripheral edema  Disposition: 01-Home or Self Care   Allergies as of 03/20/2017   No Known Allergies     Medication List    STOP taking these medications   aspirin 81 MG chewable tablet Replaced by:  aspirin 325 MG EC tablet     TAKE these medications   acetaminophen 325 MG tablet Commonly known as:  TYLENOL Take 325 mg by mouth daily after lunch.   alendronate 70 MG tablet Commonly known as:  FOSAMAX Take 70 mg by mouth every Monday.   aspirin 325 MG EC tablet Take 1 tablet (325 mg total) by mouth daily with breakfast. Start taking on:  03/21/2017 Replaces:  aspirin 81 MG chewable tablet   cholecalciferol 1000 units tablet Commonly known as:  VITAMIN D Take 1,000 Units by mouth daily after lunch.   docusate sodium 100 MG capsule Commonly known as:  COLACE Take 1 capsule (100 mg total) by mouth 2 (two) times daily.   EDARBI 80 MG Tabs Generic drug:  Azilsartan Medoxomil Take 80 mg by mouth daily after breakfast. (0800)    HYDROcodone-acetaminophen 5-325 MG tablet Commonly known as:  NORCO/VICODIN Take 1 tablet by mouth every 4 (four) hours as needed for moderate pain.   IRON PO Take 1 tablet by mouth daily after lunch.   levothyroxine 50 MCG tablet Commonly known as:  SYNTHROID, LEVOTHROID Take 50 mcg by mouth daily after breakfast. (0800)   metoprolol tartrate 25 MG tablet Commonly known as:  LOPRESSOR Take 25 mg by mouth 2 (two) times daily.   omeprazole 20 MG capsule Commonly known as:  PRILOSEC Take 20 mg by mouth daily after breakfast. (0800)   potassium chloride SA 20 MEQ tablet Commonly known as:  K-DUR,KLOR-CON Take 1 tablet (20 mEq total) by mouth 2 (two) times daily.   PX SENIOR VITAMIN PO Take 1 tablet by mouth daily after lunch.   raloxifene 60 MG tablet Commonly known as:  EVISTA Take 60 mg by mouth daily after breakfast. (0800)   simvastatin 40 MG tablet Commonly known as:  ZOCOR Take 40 mg by mouth at bedtime.   SOOTHE XP Soln Place 1 drop into both eyes 2 (two) times daily as needed (for dry eyes).   verapamil 240 MG CR tablet Commonly known as:  CALAN-SR Take 240 mg by mouth daily after breakfast. (0800)   vitamin E 400 UNIT capsule Generic drug:  vitamin E Take 400 Units by mouth daily after lunch.  Durable Medical Equipment        Start     Ordered   03/20/17 [phone]  For home use only DME 3 n 1  Once     03/20/17 0601       Signed: Arther Abbott 03/20/2017, 8:00 AM

## 2017-03-20 NOTE — Progress Notes (Signed)
Patient ID: Glenda Nolan, female   DOB: [birth year], 83 y.o.   MRN: [id number] Pod 2 tka left knee   BP (!) 108/55 (BP Location: Left Arm)   Pulse 84   Temp 98.8 F (37.1 C) (Oral)   Resp 18   SpO2 98%   She did not do well with PT in the PM due to confusion  I want to see how it goes this morning if she can do PT then we can DC after lunch   CBC Latest Ref Rng & Units 03/20/2017 03/19/2017 03/11/2017  WBC 4.0 - 10.5 K/uL 10.2 12.4(H) 7.9  Hemoglobin 12.0 - 15.0 g/dL 10.0(L) 12.7 13.7  Hematocrit 36.0 - 46.0 % 28.5(L) 36.5 39.9  Platelets 150 - 400 K/uL 250 326 253   BMP Latest Ref Rng & Units 03/20/2017 03/19/2017 03/11/2017  Glucose 65 - 99 mg/dL - 195(H) 139(H)  BUN 6 - 20 mg/dL - 6 8  Creatinine 0.44 - 1.00 mg/dL - 0.68 0.86  Sodium 135 - 145 mmol/L - 127(L) 134(L)  Potassium 3.5 - 5.1 mmol/L 3.1(L) 3.2(L) 3.6  Chloride 101 - 111 mmol/L - 93(L) 99(L)  CO2 22 - 32 mmol/L - 23 27  Calcium 8.9 - 10.3 mg/dL - 7.6(L) 9.0    She remain hypokalemic so we will continue that PO   Assess DC after PT this am

## 2017-03-21 LAB — TYPE AND SCREEN
ABO/RH(D): O NEG
Antibody Screen: NEGATIVE
Unit division: 0
Unit division: 0

## 2017-03-21 LAB — BPAM RBC
Blood Product Expiration Date: 201805092359
Blood Product Expiration Date: 201805102359
Unit Type and Rh: 9500
Unit Type and Rh: 9500

## 2017-03-22 DIAGNOSIS — Z96652 Presence of left artificial knee joint: Secondary | ICD-10-CM | POA: Diagnosis not present

## 2017-03-22 DIAGNOSIS — Z471 Aftercare following joint replacement surgery: Secondary | ICD-10-CM | POA: Diagnosis not present

## 2017-03-22 DIAGNOSIS — Z7982 Long term (current) use of aspirin: Secondary | ICD-10-CM | POA: Diagnosis not present

## 2017-03-22 DIAGNOSIS — Z79891 Long term (current) use of opiate analgesic: Secondary | ICD-10-CM | POA: Diagnosis not present

## 2017-03-22 DIAGNOSIS — I10 Essential (primary) hypertension: Secondary | ICD-10-CM | POA: Diagnosis not present

## 2017-03-22 DIAGNOSIS — E039 Hypothyroidism, unspecified: Secondary | ICD-10-CM | POA: Diagnosis not present

## 2017-03-24 DIAGNOSIS — Z79891 Long term (current) use of opiate analgesic: Secondary | ICD-10-CM | POA: Diagnosis not present

## 2017-03-24 DIAGNOSIS — I10 Essential (primary) hypertension: Secondary | ICD-10-CM | POA: Diagnosis not present

## 2017-03-24 DIAGNOSIS — E039 Hypothyroidism, unspecified: Secondary | ICD-10-CM | POA: Diagnosis not present

## 2017-03-24 DIAGNOSIS — Z7982 Long term (current) use of aspirin: Secondary | ICD-10-CM | POA: Diagnosis not present

## 2017-03-24 DIAGNOSIS — Z96652 Presence of left artificial knee joint: Secondary | ICD-10-CM | POA: Diagnosis not present

## 2017-03-24 DIAGNOSIS — Z471 Aftercare following joint replacement surgery: Secondary | ICD-10-CM | POA: Diagnosis not present

## 2017-03-26 DIAGNOSIS — Z471 Aftercare following joint replacement surgery: Secondary | ICD-10-CM | POA: Diagnosis not present

## 2017-03-26 DIAGNOSIS — Z7982 Long term (current) use of aspirin: Secondary | ICD-10-CM | POA: Diagnosis not present

## 2017-03-26 DIAGNOSIS — I10 Essential (primary) hypertension: Secondary | ICD-10-CM | POA: Diagnosis not present

## 2017-03-26 DIAGNOSIS — Z79891 Long term (current) use of opiate analgesic: Secondary | ICD-10-CM | POA: Diagnosis not present

## 2017-03-26 DIAGNOSIS — E039 Hypothyroidism, unspecified: Secondary | ICD-10-CM | POA: Diagnosis not present

## 2017-03-26 DIAGNOSIS — Z96652 Presence of left artificial knee joint: Secondary | ICD-10-CM | POA: Diagnosis not present

## 2017-03-30 ENCOUNTER — Ambulatory Visit (INDEPENDENT_AMBULATORY_CARE_PROVIDER_SITE_OTHER): Payer: Self-pay | Admitting: Orthopedic Surgery

## 2017-03-30 ENCOUNTER — Encounter: Payer: Self-pay | Admitting: Orthopedic Surgery

## 2017-03-30 DIAGNOSIS — Z471 Aftercare following joint replacement surgery: Secondary | ICD-10-CM | POA: Diagnosis not present

## 2017-03-30 DIAGNOSIS — Z96652 Presence of left artificial knee joint: Secondary | ICD-10-CM

## 2017-03-30 DIAGNOSIS — Z79891 Long term (current) use of opiate analgesic: Secondary | ICD-10-CM | POA: Diagnosis not present

## 2017-03-30 DIAGNOSIS — E039 Hypothyroidism, unspecified: Secondary | ICD-10-CM | POA: Diagnosis not present

## 2017-03-30 DIAGNOSIS — Z4889 Encounter for other specified surgical aftercare: Secondary | ICD-10-CM

## 2017-03-30 DIAGNOSIS — I10 Essential (primary) hypertension: Secondary | ICD-10-CM | POA: Diagnosis not present

## 2017-03-30 DIAGNOSIS — Z7982 Long term (current) use of aspirin: Secondary | ICD-10-CM | POA: Diagnosis not present

## 2017-03-30 MED ORDER — HYDROCODONE-ACETAMINOPHEN 5-325 MG PO TABS: 1.0000 | ORAL_TABLET | ORAL | 0 refills | Status: DC | PRN

## 2017-03-30 NOTE — Progress Notes (Signed)
Chief Complaint  Patient presents with  . Follow-up    RT TKA, DOS 03/18/17   The patient is about 2 weeks out of surgery she's not had any physical therapy at home other than what the family has done in the CPM  Her wound looks good her ankle has minimal to no edema  She does complain of continued pain  Refill her medication and see her in 4 weeks. Therapy is supposed to start this week  Meds ordered this encounter  Medications  . HYDROcodone-acetaminophen (NORCO/VICODIN) 5-325 MG tablet    Sig: Take 1 tablet by mouth every 4 (four) hours as needed for moderate pain.    Dispense:  42 tablet    Refill:  0     Encounter Diagnoses  Name Primary?  Marland Kitchen Aftercare following surgery Yes  . Status post total left knee replacement

## 2017-03-31 DIAGNOSIS — I10 Essential (primary) hypertension: Secondary | ICD-10-CM | POA: Diagnosis not present

## 2017-03-31 DIAGNOSIS — Z96652 Presence of left artificial knee joint: Secondary | ICD-10-CM | POA: Diagnosis not present

## 2017-03-31 DIAGNOSIS — E039 Hypothyroidism, unspecified: Secondary | ICD-10-CM | POA: Diagnosis not present

## 2017-03-31 DIAGNOSIS — Z471 Aftercare following joint replacement surgery: Secondary | ICD-10-CM | POA: Diagnosis not present

## 2017-03-31 DIAGNOSIS — Z7982 Long term (current) use of aspirin: Secondary | ICD-10-CM | POA: Diagnosis not present

## 2017-03-31 DIAGNOSIS — Z79891 Long term (current) use of opiate analgesic: Secondary | ICD-10-CM | POA: Diagnosis not present

## 2017-04-01 DIAGNOSIS — Z7982 Long term (current) use of aspirin: Secondary | ICD-10-CM | POA: Diagnosis not present

## 2017-04-01 DIAGNOSIS — I10 Essential (primary) hypertension: Secondary | ICD-10-CM | POA: Diagnosis not present

## 2017-04-01 DIAGNOSIS — Z79891 Long term (current) use of opiate analgesic: Secondary | ICD-10-CM | POA: Diagnosis not present

## 2017-04-01 DIAGNOSIS — Z471 Aftercare following joint replacement surgery: Secondary | ICD-10-CM | POA: Diagnosis not present

## 2017-04-01 DIAGNOSIS — E039 Hypothyroidism, unspecified: Secondary | ICD-10-CM | POA: Diagnosis not present

## 2017-04-01 DIAGNOSIS — Z96652 Presence of left artificial knee joint: Secondary | ICD-10-CM | POA: Diagnosis not present

## 2017-04-02 ENCOUNTER — Telehealth: Payer: Self-pay | Admitting: Orthopedic Surgery

## 2017-04-02 DIAGNOSIS — E039 Hypothyroidism, unspecified: Secondary | ICD-10-CM | POA: Diagnosis not present

## 2017-04-02 DIAGNOSIS — Z79891 Long term (current) use of opiate analgesic: Secondary | ICD-10-CM | POA: Diagnosis not present

## 2017-04-02 DIAGNOSIS — Z96652 Presence of left artificial knee joint: Secondary | ICD-10-CM | POA: Diagnosis not present

## 2017-04-02 DIAGNOSIS — Z7982 Long term (current) use of aspirin: Secondary | ICD-10-CM | POA: Diagnosis not present

## 2017-04-02 DIAGNOSIS — Z471 Aftercare following joint replacement surgery: Secondary | ICD-10-CM | POA: Diagnosis not present

## 2017-04-02 DIAGNOSIS — I10 Essential (primary) hypertension: Secondary | ICD-10-CM | POA: Diagnosis not present

## 2017-04-02 NOTE — Telephone Encounter (Signed)
Malorie OT Therapist with [facility] called asking for a verbal for this patient.  Please call and advise

## 2017-04-03 DIAGNOSIS — Z96652 Presence of left artificial knee joint: Secondary | ICD-10-CM | POA: Diagnosis not present

## 2017-04-03 DIAGNOSIS — E039 Hypothyroidism, unspecified: Secondary | ICD-10-CM | POA: Diagnosis not present

## 2017-04-03 DIAGNOSIS — Z471 Aftercare following joint replacement surgery: Secondary | ICD-10-CM | POA: Diagnosis not present

## 2017-04-03 DIAGNOSIS — Z7982 Long term (current) use of aspirin: Secondary | ICD-10-CM | POA: Diagnosis not present

## 2017-04-03 DIAGNOSIS — Z79891 Long term (current) use of opiate analgesic: Secondary | ICD-10-CM | POA: Diagnosis not present

## 2017-04-03 DIAGNOSIS — I10 Essential (primary) hypertension: Secondary | ICD-10-CM | POA: Diagnosis not present

## 2017-04-06 DIAGNOSIS — I10 Essential (primary) hypertension: Secondary | ICD-10-CM | POA: Diagnosis not present

## 2017-04-06 DIAGNOSIS — Z7982 Long term (current) use of aspirin: Secondary | ICD-10-CM | POA: Diagnosis not present

## 2017-04-06 DIAGNOSIS — Z96652 Presence of left artificial knee joint: Secondary | ICD-10-CM | POA: Diagnosis not present

## 2017-04-06 DIAGNOSIS — E039 Hypothyroidism, unspecified: Secondary | ICD-10-CM | POA: Diagnosis not present

## 2017-04-06 DIAGNOSIS — Z471 Aftercare following joint replacement surgery: Secondary | ICD-10-CM | POA: Diagnosis not present

## 2017-04-06 DIAGNOSIS — Z79891 Long term (current) use of opiate analgesic: Secondary | ICD-10-CM | POA: Diagnosis not present

## 2017-04-06 NOTE — Telephone Encounter (Signed)
Called back to therapist and relayed per Dr Harrison's response.

## 2017-04-06 NOTE — Telephone Encounter (Signed)
Mallorie, Occupational therapist for [facility] care, called back for verbal orders for home therapy visits, status/post total knee replacement left, 03/18/17:  for 1 time per week for 1 week  and 2 times per week for 3 weeks Please advise - direct phone# [phone]

## 2017-04-06 NOTE — Telephone Encounter (Signed)
I approve

## 2017-04-07 DIAGNOSIS — Z471 Aftercare following joint replacement surgery: Secondary | ICD-10-CM | POA: Diagnosis not present

## 2017-04-07 DIAGNOSIS — E039 Hypothyroidism, unspecified: Secondary | ICD-10-CM | POA: Diagnosis not present

## 2017-04-07 DIAGNOSIS — Z7982 Long term (current) use of aspirin: Secondary | ICD-10-CM | POA: Diagnosis not present

## 2017-04-07 DIAGNOSIS — Z96652 Presence of left artificial knee joint: Secondary | ICD-10-CM | POA: Diagnosis not present

## 2017-04-07 DIAGNOSIS — I10 Essential (primary) hypertension: Secondary | ICD-10-CM | POA: Diagnosis not present

## 2017-04-07 DIAGNOSIS — Z79891 Long term (current) use of opiate analgesic: Secondary | ICD-10-CM | POA: Diagnosis not present

## 2017-04-08 DIAGNOSIS — I10 Essential (primary) hypertension: Secondary | ICD-10-CM | POA: Diagnosis not present

## 2017-04-08 DIAGNOSIS — E039 Hypothyroidism, unspecified: Secondary | ICD-10-CM | POA: Diagnosis not present

## 2017-04-08 DIAGNOSIS — Z471 Aftercare following joint replacement surgery: Secondary | ICD-10-CM | POA: Diagnosis not present

## 2017-04-08 DIAGNOSIS — Z7982 Long term (current) use of aspirin: Secondary | ICD-10-CM | POA: Diagnosis not present

## 2017-04-08 DIAGNOSIS — Z79891 Long term (current) use of opiate analgesic: Secondary | ICD-10-CM | POA: Diagnosis not present

## 2017-04-08 DIAGNOSIS — Z96652 Presence of left artificial knee joint: Secondary | ICD-10-CM | POA: Diagnosis not present

## 2017-04-09 DIAGNOSIS — Z96652 Presence of left artificial knee joint: Secondary | ICD-10-CM | POA: Diagnosis not present

## 2017-04-09 DIAGNOSIS — Z471 Aftercare following joint replacement surgery: Secondary | ICD-10-CM | POA: Diagnosis not present

## 2017-04-09 DIAGNOSIS — Z7982 Long term (current) use of aspirin: Secondary | ICD-10-CM | POA: Diagnosis not present

## 2017-04-09 DIAGNOSIS — Z79891 Long term (current) use of opiate analgesic: Secondary | ICD-10-CM | POA: Diagnosis not present

## 2017-04-09 DIAGNOSIS — E039 Hypothyroidism, unspecified: Secondary | ICD-10-CM | POA: Diagnosis not present

## 2017-04-09 DIAGNOSIS — I10 Essential (primary) hypertension: Secondary | ICD-10-CM | POA: Diagnosis not present

## 2017-04-10 DIAGNOSIS — E039 Hypothyroidism, unspecified: Secondary | ICD-10-CM | POA: Diagnosis not present

## 2017-04-10 DIAGNOSIS — I10 Essential (primary) hypertension: Secondary | ICD-10-CM | POA: Diagnosis not present

## 2017-04-10 DIAGNOSIS — Z79891 Long term (current) use of opiate analgesic: Secondary | ICD-10-CM | POA: Diagnosis not present

## 2017-04-10 DIAGNOSIS — Z471 Aftercare following joint replacement surgery: Secondary | ICD-10-CM | POA: Diagnosis not present

## 2017-04-10 DIAGNOSIS — Z96652 Presence of left artificial knee joint: Secondary | ICD-10-CM | POA: Diagnosis not present

## 2017-04-10 DIAGNOSIS — Z7982 Long term (current) use of aspirin: Secondary | ICD-10-CM | POA: Diagnosis not present

## 2017-04-13 ENCOUNTER — Other Ambulatory Visit: Payer: Self-pay | Admitting: Orthopedic Surgery

## 2017-04-13 ENCOUNTER — Telehealth: Payer: Self-pay | Admitting: Orthopedic Surgery

## 2017-04-13 DIAGNOSIS — Z79891 Long term (current) use of opiate analgesic: Secondary | ICD-10-CM | POA: Diagnosis not present

## 2017-04-13 DIAGNOSIS — I10 Essential (primary) hypertension: Secondary | ICD-10-CM | POA: Diagnosis not present

## 2017-04-13 DIAGNOSIS — Z7982 Long term (current) use of aspirin: Secondary | ICD-10-CM | POA: Diagnosis not present

## 2017-04-13 DIAGNOSIS — E039 Hypothyroidism, unspecified: Secondary | ICD-10-CM | POA: Diagnosis not present

## 2017-04-13 DIAGNOSIS — Z471 Aftercare following joint replacement surgery: Secondary | ICD-10-CM | POA: Diagnosis not present

## 2017-04-13 DIAGNOSIS — Z96652 Presence of left artificial knee joint: Secondary | ICD-10-CM | POA: Diagnosis not present

## 2017-04-13 DIAGNOSIS — Z4889 Encounter for other specified surgical aftercare: Secondary | ICD-10-CM

## 2017-04-13 MED ORDER — HYDROCODONE-ACETAMINOPHEN 5-325 MG PO TABS: 1.0000 | ORAL_TABLET | Freq: Four times a day (QID) | ORAL | 0 refills | Status: DC | PRN

## 2017-04-13 NOTE — Telephone Encounter (Signed)
Patient and her daughter state that Glenda Nolan will run out of her pain medication by Thursday morning.  They request a refill on Hydrocodone/Acetaminophen (Norco) 5-325 mgs.   Qty  42  Sig: Take 1 tablet by mouth every 4 (four) hours as needed for moderate pain.

## 2017-04-13 NOTE — Telephone Encounter (Signed)
ROUTING TO DR HARRISON TO APPROVE 

## 2017-04-14 DIAGNOSIS — I10 Essential (primary) hypertension: Secondary | ICD-10-CM | POA: Diagnosis not present

## 2017-04-14 DIAGNOSIS — Z79891 Long term (current) use of opiate analgesic: Secondary | ICD-10-CM | POA: Diagnosis not present

## 2017-04-14 DIAGNOSIS — Z7982 Long term (current) use of aspirin: Secondary | ICD-10-CM | POA: Diagnosis not present

## 2017-04-14 DIAGNOSIS — E039 Hypothyroidism, unspecified: Secondary | ICD-10-CM | POA: Diagnosis not present

## 2017-04-14 DIAGNOSIS — Z471 Aftercare following joint replacement surgery: Secondary | ICD-10-CM | POA: Diagnosis not present

## 2017-04-14 DIAGNOSIS — Z96652 Presence of left artificial knee joint: Secondary | ICD-10-CM | POA: Diagnosis not present

## 2017-04-15 DIAGNOSIS — E039 Hypothyroidism, unspecified: Secondary | ICD-10-CM | POA: Diagnosis not present

## 2017-04-15 DIAGNOSIS — I10 Essential (primary) hypertension: Secondary | ICD-10-CM | POA: Diagnosis not present

## 2017-04-15 DIAGNOSIS — Z471 Aftercare following joint replacement surgery: Secondary | ICD-10-CM | POA: Diagnosis not present

## 2017-04-15 DIAGNOSIS — Z7982 Long term (current) use of aspirin: Secondary | ICD-10-CM | POA: Diagnosis not present

## 2017-04-15 DIAGNOSIS — Z79891 Long term (current) use of opiate analgesic: Secondary | ICD-10-CM | POA: Diagnosis not present

## 2017-04-15 DIAGNOSIS — Z96652 Presence of left artificial knee joint: Secondary | ICD-10-CM | POA: Diagnosis not present

## 2017-04-16 DIAGNOSIS — E039 Hypothyroidism, unspecified: Secondary | ICD-10-CM | POA: Diagnosis not present

## 2017-04-16 DIAGNOSIS — I10 Essential (primary) hypertension: Secondary | ICD-10-CM | POA: Diagnosis not present

## 2017-04-16 DIAGNOSIS — Z7982 Long term (current) use of aspirin: Secondary | ICD-10-CM | POA: Diagnosis not present

## 2017-04-16 DIAGNOSIS — Z471 Aftercare following joint replacement surgery: Secondary | ICD-10-CM | POA: Diagnosis not present

## 2017-04-16 DIAGNOSIS — Z79891 Long term (current) use of opiate analgesic: Secondary | ICD-10-CM | POA: Diagnosis not present

## 2017-04-16 DIAGNOSIS — Z96652 Presence of left artificial knee joint: Secondary | ICD-10-CM | POA: Diagnosis not present

## 2017-04-17 DIAGNOSIS — Z96652 Presence of left artificial knee joint: Secondary | ICD-10-CM | POA: Diagnosis not present

## 2017-04-17 DIAGNOSIS — Z471 Aftercare following joint replacement surgery: Secondary | ICD-10-CM | POA: Diagnosis not present

## 2017-04-17 DIAGNOSIS — I10 Essential (primary) hypertension: Secondary | ICD-10-CM | POA: Diagnosis not present

## 2017-04-17 DIAGNOSIS — Z7982 Long term (current) use of aspirin: Secondary | ICD-10-CM | POA: Diagnosis not present

## 2017-04-17 DIAGNOSIS — E039 Hypothyroidism, unspecified: Secondary | ICD-10-CM | POA: Diagnosis not present

## 2017-04-17 DIAGNOSIS — Z79891 Long term (current) use of opiate analgesic: Secondary | ICD-10-CM | POA: Diagnosis not present

## 2017-04-21 DIAGNOSIS — I10 Essential (primary) hypertension: Secondary | ICD-10-CM | POA: Diagnosis not present

## 2017-04-21 DIAGNOSIS — Z79891 Long term (current) use of opiate analgesic: Secondary | ICD-10-CM | POA: Diagnosis not present

## 2017-04-21 DIAGNOSIS — E039 Hypothyroidism, unspecified: Secondary | ICD-10-CM | POA: Diagnosis not present

## 2017-04-21 DIAGNOSIS — Z7982 Long term (current) use of aspirin: Secondary | ICD-10-CM | POA: Diagnosis not present

## 2017-04-21 DIAGNOSIS — Z96652 Presence of left artificial knee joint: Secondary | ICD-10-CM | POA: Diagnosis not present

## 2017-04-21 DIAGNOSIS — Z471 Aftercare following joint replacement surgery: Secondary | ICD-10-CM | POA: Diagnosis not present

## 2017-04-22 DIAGNOSIS — I10 Essential (primary) hypertension: Secondary | ICD-10-CM | POA: Diagnosis not present

## 2017-04-22 DIAGNOSIS — Z471 Aftercare following joint replacement surgery: Secondary | ICD-10-CM | POA: Diagnosis not present

## 2017-04-22 DIAGNOSIS — Z79891 Long term (current) use of opiate analgesic: Secondary | ICD-10-CM | POA: Diagnosis not present

## 2017-04-22 DIAGNOSIS — Z7982 Long term (current) use of aspirin: Secondary | ICD-10-CM | POA: Diagnosis not present

## 2017-04-22 DIAGNOSIS — E039 Hypothyroidism, unspecified: Secondary | ICD-10-CM | POA: Diagnosis not present

## 2017-04-22 DIAGNOSIS — Z96652 Presence of left artificial knee joint: Secondary | ICD-10-CM | POA: Diagnosis not present

## 2017-04-23 DIAGNOSIS — Z7982 Long term (current) use of aspirin: Secondary | ICD-10-CM | POA: Diagnosis not present

## 2017-04-23 DIAGNOSIS — Z471 Aftercare following joint replacement surgery: Secondary | ICD-10-CM | POA: Diagnosis not present

## 2017-04-23 DIAGNOSIS — Z79891 Long term (current) use of opiate analgesic: Secondary | ICD-10-CM | POA: Diagnosis not present

## 2017-04-23 DIAGNOSIS — I10 Essential (primary) hypertension: Secondary | ICD-10-CM | POA: Diagnosis not present

## 2017-04-23 DIAGNOSIS — E039 Hypothyroidism, unspecified: Secondary | ICD-10-CM | POA: Diagnosis not present

## 2017-04-23 DIAGNOSIS — Z96652 Presence of left artificial knee joint: Secondary | ICD-10-CM | POA: Diagnosis not present

## 2017-04-24 DIAGNOSIS — Z7982 Long term (current) use of aspirin: Secondary | ICD-10-CM | POA: Diagnosis not present

## 2017-04-24 DIAGNOSIS — Z471 Aftercare following joint replacement surgery: Secondary | ICD-10-CM | POA: Diagnosis not present

## 2017-04-24 DIAGNOSIS — E039 Hypothyroidism, unspecified: Secondary | ICD-10-CM | POA: Diagnosis not present

## 2017-04-24 DIAGNOSIS — I10 Essential (primary) hypertension: Secondary | ICD-10-CM | POA: Diagnosis not present

## 2017-04-24 DIAGNOSIS — Z96652 Presence of left artificial knee joint: Secondary | ICD-10-CM | POA: Diagnosis not present

## 2017-04-24 DIAGNOSIS — Z79891 Long term (current) use of opiate analgesic: Secondary | ICD-10-CM | POA: Diagnosis not present

## 2017-04-27 ENCOUNTER — Ambulatory Visit (INDEPENDENT_AMBULATORY_CARE_PROVIDER_SITE_OTHER): Payer: Self-pay | Admitting: Orthopedic Surgery

## 2017-04-27 ENCOUNTER — Encounter: Payer: Self-pay | Admitting: Orthopedic Surgery

## 2017-04-27 DIAGNOSIS — Z4889 Encounter for other specified surgical aftercare: Secondary | ICD-10-CM

## 2017-04-27 DIAGNOSIS — Z96652 Presence of left artificial knee joint: Secondary | ICD-10-CM

## 2017-04-27 NOTE — Progress Notes (Signed)
Patient ID: Glenda Nolan, female   DOB: 12/04/1934, 82 y.o.   MRN: [id number]  Post op visit   Chief Complaint  Patient presents with  . Follow-up    POST OP left TKA, DOS 03/18/17   6 weeks postop postop day 40  Patient doing well requires no pain medication at this time. She's progressed to a cane. Her flexion arc is past 90 she has full extension  Recommend continue home exercises progressively increase activity and return in 6 weeks.

## 2017-04-30 DIAGNOSIS — Z96652 Presence of left artificial knee joint: Secondary | ICD-10-CM | POA: Diagnosis not present

## 2017-04-30 DIAGNOSIS — E039 Hypothyroidism, unspecified: Secondary | ICD-10-CM | POA: Diagnosis not present

## 2017-04-30 DIAGNOSIS — Z79891 Long term (current) use of opiate analgesic: Secondary | ICD-10-CM | POA: Diagnosis not present

## 2017-04-30 DIAGNOSIS — Z7982 Long term (current) use of aspirin: Secondary | ICD-10-CM | POA: Diagnosis not present

## 2017-04-30 DIAGNOSIS — I10 Essential (primary) hypertension: Secondary | ICD-10-CM | POA: Diagnosis not present

## 2017-04-30 DIAGNOSIS — Z471 Aftercare following joint replacement surgery: Secondary | ICD-10-CM | POA: Diagnosis not present

## 2017-05-08 DIAGNOSIS — I10 Essential (primary) hypertension: Secondary | ICD-10-CM | POA: Diagnosis not present

## 2017-05-08 DIAGNOSIS — Z96652 Presence of left artificial knee joint: Secondary | ICD-10-CM | POA: Diagnosis not present

## 2017-05-08 DIAGNOSIS — E039 Hypothyroidism, unspecified: Secondary | ICD-10-CM | POA: Diagnosis not present

## 2017-05-08 DIAGNOSIS — Z79891 Long term (current) use of opiate analgesic: Secondary | ICD-10-CM | POA: Diagnosis not present

## 2017-05-08 DIAGNOSIS — Z7982 Long term (current) use of aspirin: Secondary | ICD-10-CM | POA: Diagnosis not present

## 2017-05-08 DIAGNOSIS — Z471 Aftercare following joint replacement surgery: Secondary | ICD-10-CM | POA: Diagnosis not present

## 2017-05-13 ENCOUNTER — Telehealth: Payer: Self-pay | Admitting: Orthopedic Surgery

## 2017-05-13 NOTE — Telephone Encounter (Signed)
Routing to Dr. Harrison to advise 

## 2017-05-13 NOTE — Telephone Encounter (Signed)
Advised patient of Dr. Ruthe Mannan reply.

## 2017-05-13 NOTE — Telephone Encounter (Signed)
Patient and daughter, contact, [name], called to ask if patient is to continue medication:  potassium chloride SA (K-DUR,KLOR-CON) 20 MEQ tablet 60 tablet   - if so, refill requested, to [facility]

## 2017-05-13 NOTE — Telephone Encounter (Signed)
Please ask primary care doctor

## 2017-06-03 DIAGNOSIS — I10 Essential (primary) hypertension: Secondary | ICD-10-CM | POA: Diagnosis not present

## 2017-06-03 DIAGNOSIS — Z6824 Body mass index (BMI) 24.0-24.9, adult: Secondary | ICD-10-CM | POA: Diagnosis not present

## 2017-06-08 ENCOUNTER — Ambulatory Visit (INDEPENDENT_AMBULATORY_CARE_PROVIDER_SITE_OTHER): Payer: Self-pay | Admitting: Orthopedic Surgery

## 2017-06-08 ENCOUNTER — Encounter: Payer: Self-pay | Admitting: Orthopedic Surgery

## 2017-06-08 DIAGNOSIS — Z4889 Encounter for other specified surgical aftercare: Secondary | ICD-10-CM

## 2017-06-08 DIAGNOSIS — Z96652 Presence of left artificial knee joint: Secondary | ICD-10-CM

## 2017-06-08 NOTE — Progress Notes (Signed)
Postop visit  Chief Complaint  Patient presents with  . Follow-up    Recheck on left total knee replacement, DOS 03-18-17.    82 years old 3 months after total knee. Her pain is well controlled she is using a cane she regained excellent range of motion her knee is stable and no signs of infection she can stop taking 325 mg of aspirin and resume 81 mg  Follow-up in 3 months for 6 month postop checkup

## 2017-06-30 ENCOUNTER — Telehealth: Payer: Self-pay | Admitting: Orthopedic Surgery

## 2017-06-30 NOTE — Telephone Encounter (Signed)
Per call from Steva Ready at [employer], prescription insurer, Appeals department; in response to fax received 06/25/17 regarding medication: potassium chloride SA (K-DUR,KLOR-CON) 20 MEQ tablet, and a note written on the fax "no potassium chloride needed"; therefore, asking if they can therefore withdraw the request.  Per [place] Cerise Boothe,LPN, yes, they may withdraw.  Relayed.   Also followed up with patient, and with her designated contact, daughter [name].

## 2017-08-17 DIAGNOSIS — R7301 Impaired fasting glucose: Secondary | ICD-10-CM | POA: Diagnosis not present

## 2017-08-17 DIAGNOSIS — I10 Essential (primary) hypertension: Secondary | ICD-10-CM | POA: Diagnosis not present

## 2017-08-17 DIAGNOSIS — E039 Hypothyroidism, unspecified: Secondary | ICD-10-CM | POA: Diagnosis not present

## 2017-08-19 DIAGNOSIS — Z23 Encounter for immunization: Secondary | ICD-10-CM | POA: Diagnosis not present

## 2017-08-19 DIAGNOSIS — R7301 Impaired fasting glucose: Secondary | ICD-10-CM | POA: Diagnosis not present

## 2017-08-19 DIAGNOSIS — E782 Mixed hyperlipidemia: Secondary | ICD-10-CM | POA: Diagnosis not present

## 2017-08-19 DIAGNOSIS — E039 Hypothyroidism, unspecified: Secondary | ICD-10-CM | POA: Diagnosis not present

## 2017-08-19 DIAGNOSIS — I10 Essential (primary) hypertension: Secondary | ICD-10-CM | POA: Diagnosis not present

## 2017-08-19 DIAGNOSIS — M816 Localized osteoporosis [Lequesne]: Secondary | ICD-10-CM | POA: Diagnosis not present

## 2017-09-07 ENCOUNTER — Ambulatory Visit (INDEPENDENT_AMBULATORY_CARE_PROVIDER_SITE_OTHER): Payer: Medicare Other | Admitting: Orthopedic Surgery

## 2017-09-07 ENCOUNTER — Encounter: Payer: Self-pay | Admitting: Orthopedic Surgery

## 2017-09-07 VITALS — BP 157/86 | HR 72 | Ht 64.0 in | Wt 134.0 lb

## 2017-09-07 DIAGNOSIS — Z96652 Presence of left artificial knee joint: Secondary | ICD-10-CM

## 2017-09-07 NOTE — Progress Notes (Signed)
Progress Note   Patient ID: Glenda Nolan, female   DOB: [birth year], 82 y.o.   MRN: [id number]  Chief Complaint  Patient presents with  . Post-op Follow-up    5 1/2 months s/p  Date of surgery 03/18/2017 left TKR    83 year old female 6 months after left total knee. Appears to be doing well with mild discomfort in her left knee. She says she feels that something sort of moves out of place so we will check that.     Review of Systems  Constitutional: Negative for chills and fever.  Neurological: Negative for tingling.   Current Meds  Medication Sig  . acetaminophen (TYLENOL) 325 MG tablet Take 325 mg by mouth daily after lunch.   Marland Kitchen alendronate (FOSAMAX) 70 MG tablet Take 70 mg by mouth every Monday.   . Artificial Tear Solution (SOOTHE XP) SOLN Place 1 drop into both eyes 2 (two) times daily as needed (for dry eyes).  Marland Kitchen aspirin EC 325 MG EC tablet Take 1 tablet (325 mg total) by mouth daily with breakfast.  . cholecalciferol (VITAMIN D) 1000 UNITS tablet Take 1,000 Units by mouth daily after lunch.   . docusate sodium (COLACE) 100 MG capsule Take 1 capsule (100 mg total) by mouth 2 (two) times daily.  Marland Kitchen EDARBI 80 MG TABS Take 80 mg by mouth daily after breakfast. (0800)  . IRON PO Take 1 tablet by mouth daily after lunch.   . levothyroxine (SYNTHROID, LEVOTHROID) 50 MCG tablet Take 50 mcg by mouth daily after breakfast. (0800)  . metoprolol tartrate (LOPRESSOR) 25 MG tablet Take 25 mg by mouth 2 (two) times daily.   . Multiple Vitamins-Minerals (PX SENIOR VITAMIN PO) Take 1 tablet by mouth daily after lunch.   Marland Kitchen omeprazole (PRILOSEC) 20 MG capsule Take 20 mg by mouth daily after breakfast. (0800)  . potassium chloride SA (K-DUR,KLOR-CON) 20 MEQ tablet Take 1 tablet (20 mEq total) by mouth 2 (two) times daily.  . raloxifene (EVISTA) 60 MG tablet Take 60 mg by mouth daily after breakfast. (0800)  . simvastatin (ZOCOR) 40 MG tablet Take 40 mg by mouth at bedtime.   . verapamil (CALAN-SR)  240 MG CR tablet Take 240 mg by mouth daily after breakfast. (0800)  . vitamin E (VITAMIN E) 400 UNIT capsule Take 400 Units by mouth daily after lunch.      Physical Exam BP (!) 157/86   Pulse 72   Ht 5\' 4"  (1.626 m)   Wt 134 lb (60.8 kg)   BMI 23.00 kg/m   Gen. appearance the patient's appearance is normal with normal grooming and  hygiene The patient is oriented to person place and time Mood and affect are normal  BP (!) 157/86   Pulse 72   Ht 5\' 4"  (1.626 m)   Wt 134 lb (60.8 kg)   BMI 23.00 kg/m  Ortho Exam Cane used as needed   Her incision looks good. She has full extension. Her knee flexion is 120. She stable in the medial lateral plane with the knee in extension and she is stable in the sagittal plane with the knee in flexion  Patellofemoral joint appears stable at 20 of flexion with lateral stress   Medical decision-making Encounter Diagnosis  Name Primary?  . S/P TKR (total knee replacement), left 03/18/2017 Yes    rec 1 yr post op xrays I dont feel any instability   Arther Abbott, MD 09/07/2017 10:50 AM

## 2017-09-21 DIAGNOSIS — Z23 Encounter for immunization: Secondary | ICD-10-CM | POA: Diagnosis not present

## 2017-12-21 DIAGNOSIS — E871 Hypo-osmolality and hyponatremia: Secondary | ICD-10-CM | POA: Diagnosis not present

## 2017-12-21 DIAGNOSIS — R7301 Impaired fasting glucose: Secondary | ICD-10-CM | POA: Diagnosis not present

## 2017-12-21 DIAGNOSIS — E039 Hypothyroidism, unspecified: Secondary | ICD-10-CM | POA: Diagnosis not present

## 2017-12-21 DIAGNOSIS — I10 Essential (primary) hypertension: Secondary | ICD-10-CM | POA: Diagnosis not present

## 2017-12-21 DIAGNOSIS — M816 Localized osteoporosis [Lequesne]: Secondary | ICD-10-CM | POA: Diagnosis not present

## 2017-12-23 DIAGNOSIS — R7301 Impaired fasting glucose: Secondary | ICD-10-CM | POA: Diagnosis not present

## 2017-12-23 DIAGNOSIS — Z23 Encounter for immunization: Secondary | ICD-10-CM | POA: Diagnosis not present

## 2017-12-23 DIAGNOSIS — I10 Essential (primary) hypertension: Secondary | ICD-10-CM | POA: Diagnosis not present

## 2017-12-23 DIAGNOSIS — E871 Hypo-osmolality and hyponatremia: Secondary | ICD-10-CM | POA: Diagnosis not present

## 2017-12-23 DIAGNOSIS — E039 Hypothyroidism, unspecified: Secondary | ICD-10-CM | POA: Diagnosis not present

## 2018-03-08 ENCOUNTER — Ambulatory Visit: Payer: Medicare Other | Admitting: Orthopedic Surgery

## 2018-03-08 ENCOUNTER — Encounter: Payer: Self-pay | Admitting: Orthopedic Surgery

## 2018-03-08 ENCOUNTER — Ambulatory Visit (INDEPENDENT_AMBULATORY_CARE_PROVIDER_SITE_OTHER): Payer: Medicare Other

## 2018-03-08 VITALS — BP 137/87 | HR 71 | Ht 64.0 in | Wt 143.0 lb

## 2018-03-08 DIAGNOSIS — Z96652 Presence of left artificial knee joint: Secondary | ICD-10-CM

## 2018-03-08 NOTE — Progress Notes (Signed)
Annual follow-up status post knee replacement  Chief Complaint  Patient presents with  . Post-op Follow-up    03/18/17 left total knee replacement, patient states she is well, has good motion, no pain    Chief complaint the patient is here today for x-rays and follow-up regarding knee replacement  History The patient is  1 years status post   Left knee replacement with a [employer] fixed bearing posterior stabilized total knee   ROS  No complaints in the review of systems no numbness no tingling  Physical Exam  Awake alert oriented x3 mood affect normal.  Appearance normal uses a cane    Left Knee exam Incision clean dry intact no tenderness flexion 125 degrees extension full stability normal in all planes   Plain films today show normal alignment without loosening  Follow-up in 1 year  Echo decision making data x-rays ordered and interpreted 3 points.  Diagnosis established problem stable 1.8.  Average 2 points.  Medical decision making low complexity.

## 2018-03-15 ENCOUNTER — Other Ambulatory Visit (HOSPITAL_COMMUNITY): Payer: Self-pay | Admitting: Internal Medicine

## 2018-03-15 DIAGNOSIS — Z1231 Encounter for screening mammogram for malignant neoplasm of breast: Secondary | ICD-10-CM

## 2018-03-19 ENCOUNTER — Ambulatory Visit (HOSPITAL_COMMUNITY)
Admission: RE | Admit: 2018-03-19 | Discharge: 2018-03-19 | Disposition: A | Discharge status: Home / Self Care | Payer: Medicare Other | Source: Ambulatory Visit | Attending: Internal Medicine | Admitting: Internal Medicine

## 2018-03-19 DIAGNOSIS — Z1231 Encounter for screening mammogram for malignant neoplasm of breast: Secondary | ICD-10-CM | POA: Insufficient documentation

## 2018-03-29 ENCOUNTER — Encounter: Payer: Self-pay | Admitting: Orthopedic Surgery

## 2018-03-29 DIAGNOSIS — E782 Mixed hyperlipidemia: Secondary | ICD-10-CM | POA: Diagnosis not present

## 2018-03-29 DIAGNOSIS — E039 Hypothyroidism, unspecified: Secondary | ICD-10-CM | POA: Diagnosis not present

## 2018-03-29 DIAGNOSIS — R7301 Impaired fasting glucose: Secondary | ICD-10-CM | POA: Diagnosis not present

## 2018-03-31 DIAGNOSIS — E039 Hypothyroidism, unspecified: Secondary | ICD-10-CM | POA: Diagnosis not present

## 2018-03-31 DIAGNOSIS — I10 Essential (primary) hypertension: Secondary | ICD-10-CM | POA: Diagnosis not present

## 2018-03-31 DIAGNOSIS — M1990 Unspecified osteoarthritis, unspecified site: Secondary | ICD-10-CM | POA: Diagnosis not present

## 2018-03-31 DIAGNOSIS — E782 Mixed hyperlipidemia: Secondary | ICD-10-CM | POA: Diagnosis not present

## 2018-03-31 DIAGNOSIS — R7301 Impaired fasting glucose: Secondary | ICD-10-CM | POA: Diagnosis not present

## 2018-11-26 ENCOUNTER — Encounter: Payer: Self-pay | Admitting: Orthopedic Surgery

## 2018-11-26 DIAGNOSIS — M816 Localized osteoporosis [Lequesne]: Secondary | ICD-10-CM | POA: Diagnosis not present

## 2018-11-26 DIAGNOSIS — I10 Essential (primary) hypertension: Secondary | ICD-10-CM | POA: Diagnosis not present

## 2018-11-26 DIAGNOSIS — E039 Hypothyroidism, unspecified: Secondary | ICD-10-CM | POA: Diagnosis not present

## 2018-11-26 DIAGNOSIS — R7301 Impaired fasting glucose: Secondary | ICD-10-CM | POA: Diagnosis not present

## 2018-11-26 DIAGNOSIS — E782 Mixed hyperlipidemia: Secondary | ICD-10-CM | POA: Diagnosis not present

## 2018-12-01 ENCOUNTER — Other Ambulatory Visit (HOSPITAL_COMMUNITY): Payer: Self-pay | Admitting: Adult Health Nurse Practitioner

## 2018-12-01 ENCOUNTER — Ambulatory Visit (HOSPITAL_COMMUNITY)
Admission: RE | Admit: 2018-12-01 | Discharge: 2018-12-01 | Disposition: A | Discharge status: Home / Self Care | Payer: Medicare Other | Source: Ambulatory Visit | Attending: Adult Health Nurse Practitioner | Admitting: Adult Health Nurse Practitioner

## 2018-12-01 DIAGNOSIS — M545 Low back pain, unspecified: Secondary | ICD-10-CM

## 2018-12-01 DIAGNOSIS — I10 Essential (primary) hypertension: Secondary | ICD-10-CM | POA: Diagnosis not present

## 2018-12-01 DIAGNOSIS — E782 Mixed hyperlipidemia: Secondary | ICD-10-CM | POA: Diagnosis not present

## 2018-12-01 DIAGNOSIS — M810 Age-related osteoporosis without current pathological fracture: Secondary | ICD-10-CM | POA: Diagnosis not present

## 2018-12-01 DIAGNOSIS — E039 Hypothyroidism, unspecified: Secondary | ICD-10-CM | POA: Diagnosis not present

## 2018-12-03 ENCOUNTER — Other Ambulatory Visit: Payer: Self-pay

## 2018-12-03 NOTE — Patient Outreach (Signed)
[facility]) Care Management  12/03/2018  Glenda Nolan 07/09/1935 [id number]  TELEPHONE SCREENING Referral date: 12/01/18 Referral source: primary MD  Referral reason: memory issues/ community resources.  Insurance: [employer]   Telephone call to patient regarding primary MD referral. HIPAA verified with patient. Explained reason for call. Patient requested RNCM speak with her daughter, Celso Amy daughter and designated party release regarding her health information.   RNCM contacted patients daughter. HIPAA verified by daughter for patient. Explained reason for call.  Daughter states she would like patient to have someone come in the home for cleaning and to check on patient at least 2-3 times per week. Daughter states patient lives alone but has family that checks on her frequently. She states patient has been experiencing memory loss and confusion which has gotten worse. Daughter states patient has recently seen her primary MD.  Daughter states patient live alone but has family that checks on her and assists her with medication management and finances.  RNCM discussed advance directive with daughter. Daughter request additional information and assistance with advance directive.  RNCM provided patient name and contact phone number for [facility] care management.   PLAN: .RNCM will refer patient to social worker  Quinn Plowman RN,BSN,CCM [facility] Telephonic  [phone]

## 2018-12-07 ENCOUNTER — Other Ambulatory Visit: Payer: Self-pay

## 2018-12-07 NOTE — Patient Outreach (Signed)
[facility]) Care Management  12/07/2018  NANCY ARVIN 1935/07/31 [id number]   Initial outreach to Ms. Saidi regarding social work referral for home assistance/companion care and Regulatory affairs officer.  BSW spoke with Ms. Gernert about referral and she requested that BSW call her daughter, [name].  BSW left voicemail message for [name].  BSW will attempt to reach her again within four business days.   [name], BSW Social Worker [phone]

## 2018-12-08 ENCOUNTER — Other Ambulatory Visit: Payer: Self-pay

## 2018-12-08 NOTE — Patient Outreach (Signed)
[facility]) Care Management  12/08/2018  Glenda Nolan 10-25-1935 [id number]   BSW received return phone call from patient's daughter, Glenda Nolan.  BSW discussed social work services and reason for referral for;  in home assistance/companinon care and assistance with Advance Directives.  Patient currently lives alone but family checks on her frequently.  Family helps with managing her medications and finances, doing grocery shoppin, and transporting to MD appointments.  The family would like for patient to have companion care services 2-3 times per week.  BSW inquired about whether or not patient has Medicaid or has ever applied.  [name] was unsure.  BSW educated [name] about services that are covered under Medicare vs. Medicaid.  [name] plans to find out if patient has Medicaid or has ever applied.  She agreed to [name] going ahead and mailing application in case she has not. BSW also agreed to send a list of in home providers and information about services through [name], Disability, and [name].   BSW and [name] discussed [name] and Living Will.  BSW agreed to send her Advance Directive EMMI and packet.   The following is being mailed to patient's daughter: [place] Directive EMMI and Packet BSW will follow up within the next two weeks to ensure receipt of documentation and to further review Advance Directives.   [name], BSW Social Worker [phone]

## 2018-12-09 ENCOUNTER — Ambulatory Visit: Payer: Self-pay

## 2018-12-17 ENCOUNTER — Other Ambulatory Visit: Payer: Self-pay

## 2018-12-17 NOTE — Patient Outreach (Signed)
[facility]) Care Management  12/17/2018  Glenda Nolan [birth year] [id number]   Incoming call from patient's daughter requesting home visit to review documents mailed to her on 12/08/18.  Home visit scheduled for 12/31/18 @ 12:30 PM.  [name], [place] Worker [phone]

## 2018-12-20 ENCOUNTER — Encounter: Payer: Self-pay | Admitting: Orthopedic Surgery

## 2018-12-20 ENCOUNTER — Ambulatory Visit: Payer: Medicare Other | Admitting: Orthopedic Surgery

## 2018-12-20 VITALS — BP 177/104 | HR 91 | Ht 64.0 in | Wt 156.0 lb

## 2018-12-20 DIAGNOSIS — M47816 Spondylosis without myelopathy or radiculopathy, lumbar region: Secondary | ICD-10-CM

## 2018-12-20 NOTE — Progress Notes (Signed)
NEW Problem  OFFICE VISIT  Chief Complaint  Patient presents with  . Back Pain    mid-lower back pain, also c/o pain in right glut    84 years old had a left total knee replacement did well comes in with 4 to 6-week history of atraumatic onset of lower back pain and mid back pain with no history of weight loss fever chills malaise cancer.  Pain is unrelieved by massage pillow and Tylenol.  She takes omeprazole for reflux has not had any physical therapy or other treatment  Pain appears to be moderate constant with no associated red flags   Review of Systems  Gastrointestinal: Negative.   Genitourinary: Negative.   Neurological: Negative for tingling, tremors, sensory change, focal weakness and weakness.     Past Medical History:  Diagnosis Date  . Anemia   . Arthritis   . Cataracts, bilateral    pending surgery May 2013  . Colon cancer ([place]) 1994   Stage IIB  . Diverticulosis   . GERD (gastroesophageal reflux disease)   . HTN (hypertension)   . Hyperlipidemia   . Hypothyroidism   . Migraines    occasional migraines  . Osteoporosis     Past Surgical History:  Procedure Laterality Date  . CATARACT EXTRACTION W/PHACO Left 08/01/2013   Procedure: CATARACT EXTRACTION PHACO AND INTRAOCULAR LENS PLACEMENT (IOC);  Surgeon: Tonny Branch, MD;  Location: [facility];  Service: Ophthalmology;  Laterality: Left;  CDE:11.47  . CATARACT EXTRACTION W/PHACO Right 08/15/2013   Procedure: CATARACT EXTRACTION PHACO AND INTRAOCULAR LENS PLACEMENT (IOC);  Surgeon: Tonny Branch, MD;  Location: [facility];  Service: Ophthalmology;  Laterality: Right;  CDE:  16.81  . COLON SURGERY  1994   Right  . COLONOSCOPY  04/01/2012   Rourk-Colonic ulcers at the anastomosis likely NSAID related, although ischemia is not excluded, status post biopsy, left-sided diverticula.  Internal hemorrhoids and anal papilla  . COLONOSCOPY W/ BIOPSIES  05/04/09   Dr. Madolyn Frieze papilla, diverticulosis  . DILATION AND CURETTAGE OF  UTERUS    . ESOPHAGOGASTRODUODENOSCOPY  04/01/2012   Rourk-Normal esophagus, small hiatal hernia, deformity, scarring friability of the antrum/prepyloric mucosa suggestive of prior peptic ulcer disease, normal D1 and D2.  . HEMORRHOID SURGERY    . knee arthrocopy(Right)    . TOTAL KNEE ARTHROPLASTY Left 03/18/2017   Procedure: TOTAL KNEE ARTHROPLASTY;  Surgeon: Carole Civil, MD;  Location: [facility];  Service: Orthopedics;  Laterality: Left;    Family History  Problem Relation Age of Onset  . Leukemia Mother 21  . Hypertension Mother   . Heart failure Father 47  . Coronary artery disease Sister        x2  . Stroke Son    Social History   Tobacco Use  . Smoking status: Former Smoker    Packs/day: 0.50    Years: 10.00    Pack years: 5.00    Types: Cigarettes    Last attempt to quit: 11/24/1992    Years since quitting: 26.0  . Smokeless tobacco: Former Systems developer    Quit date: 12/22/1992  Substance Use Topics  . Alcohol use: Yes    Comment: a glass of wine once a year  . Drug use: No    No Known Allergies  Current Meds  Medication Sig  . alendronate (FOSAMAX) 70 MG tablet Take 70 mg by mouth every Monday.   Marland Kitchen aspirin EC 325 MG EC tablet Take 1 tablet (325 mg total) by mouth daily with breakfast.  .  cholecalciferol (VITAMIN D) 1000 UNITS tablet Take 1,000 Units by mouth daily after lunch.   . docusate sodium (COLACE) 100 MG capsule Take 1 capsule (100 mg total) by mouth 2 (two) times daily.  . IRON PO Take 1 tablet by mouth daily after lunch.   . levothyroxine (SYNTHROID, LEVOTHROID) 50 MCG tablet Take 50 mcg by mouth daily after breakfast. (0800)  . metoprolol tartrate (LOPRESSOR) 25 MG tablet Take 25 mg by mouth 2 (two) times daily.   Marland Kitchen omeprazole (PRILOSEC) 20 MG capsule Take 20 mg by mouth daily after breakfast. (0800)  . raloxifene (EVISTA) 60 MG tablet Take 60 mg by mouth daily after breakfast. (0800)  . simvastatin (ZOCOR) 40 MG tablet Take 40 mg by mouth at bedtime.   Marland Kitchen  telmisartan (MICARDIS) 80 MG tablet   . verapamil (CALAN-SR) 240 MG CR tablet Take 240 mg by mouth daily after breakfast. (0800)  . vitamin E (VITAMIN E) 400 UNIT capsule Take 400 Units by mouth daily after lunch.     BP (!) 177/104   Pulse 91   Ht 5\' 4"  (1.626 m)   Wt 156 lb (70.8 kg)   BMI 26.78 kg/m   Physical Exam Vitals signs and nursing note reviewed.  Constitutional:      General: She is not in acute distress.    Appearance: Normal appearance. She is not ill-appearing, toxic-appearing or diaphoretic.  Musculoskeletal:     Right hip: She exhibits normal range of motion, normal strength, no tenderness, no bony tenderness, no swelling, no crepitus, no deformity and no laceration.     Left hip: Normal. She exhibits normal range of motion, normal strength, no tenderness, no bony tenderness, no swelling, no crepitus, no deformity and no laceration.       Back:  Skin:    General: Skin is warm and dry.  Neurological:     General: No focal deficit present.     Mental Status: She is alert and oriented to person, place, and time. Mental status is at baseline.     Sensory: No sensory deficit.     Motor: No weakness.     Coordination: Coordination normal.     Gait: Gait abnormal.     Deep Tendon Reflexes: Reflexes normal.  Psychiatric:        Mood and Affect: Mood normal.        Behavior: Behavior normal.        Thought Content: Thought content normal.        Judgment: Judgment normal.     Ortho Exam    MEDICAL DECISION SECTION  Xrays were done at CLINICAL DATA:  Chronic back pain.   EXAM: LUMBAR SPINE - COMPLETE 4+ VIEW   COMPARISON:  09/18/2008.   FINDINGS: Diffuse multilevel degenerative change lumbar spine with mild scoliosis concave left. Degenerative changes both hips. No acute bony abnormality identified. Pelvic calcifications consistent phleboliths. Aortoiliac atherosclerotic vascular calcification. Surgical clips and sutures noted over the right abdomen.    IMPRESSION: 1. Diffuse multilevel degenerative change lumbar spine. Mild scoliosis lumbar spine concave left. Degenerative changes both hips. No acute bony abnormality.   2.  Aortoiliac atherosclerotic vascular disease.     Electronically Signed   By: Marcello Moores  Register   On: 12/02/2018 05:42   My independent reading of xrays:  Multilevel degenerative disc disease with mild scoliosis in the lumbar spine mild degenerative changes both hips  Encounter Diagnosis  Name Primary?  Marland Kitchen Arthritis of lumbar spine Yes  PLAN: (Rx., injectx, surgery, frx, mri/ct) Recommend physical therapy no surgery needed there are no compressive lesions on examination  No orders of the defined types were placed in this encounter.   Arther Abbott, MD  12/20/2018 4:05 PM

## 2018-12-20 NOTE — Patient Instructions (Addendum)
[place] [facility]. [place] [address]  [place],  [place]  [address] [phone]    Chronic Back Pain When back pain lasts longer than 3 months, it is called chronic back pain. Pain may get worse at certain times (flare-ups). There are things you can do at home to manage your pain. Follow these instructions at home: Activity      Avoid bending and other activities that make pain worse.  When standing: ? Keep your upper back and neck straight. ? Keep your shoulders pulled back. ? Avoid slouching.  When sitting: ? Keep your back straight. ? Relax your shoulders. Do not round your shoulders or pull them backward.  Do not sit or stand in one place for long periods of time.  Take short rest breaks during the day. Lying down or standing is usually better than sitting. Resting can help relieve pain.  When sitting or lying down for a long time, do some mild activity or stretching. This will help to prevent stiffness and pain.  Get regular exercise. Ask your doctor what activities are safe for you.  Do not lift anything that is heavier than 10 lb (4.5 kg). To prevent injury when you lift things: ? Bend your knees. ? Keep the weight close to your body. ? Avoid twisting. Managing pain  If told, put ice on the painful area. Your doctor may tell you to use ice for 24-48 hours after a flare-up starts. ? Put ice in a plastic bag. ? Place a towel between your skin and the bag. ? Leave the ice on for 20 minutes, 2-3 times a day.  If told, put heat on the painful area as often as told by your doctor. Use the heat source that your doctor recommends, such as a moist heat pack or a heating pad. ? Place a towel between your skin and the heat source. ? Leave the heat on for 20-30 minutes. ? Remove the heat if your skin turns bright red. This is especially important if you are unable to feel pain, heat, or cold. You may have a greater risk of getting  burned.  Soak in a warm bath. This can help relieve pain.  Take over-the-counter and prescription medicines only as told by your doctor. General instructions  Sleep on a firm mattress. Try lying on your side with your knees slightly bent. If you lie on your back, put a pillow under your knees.  Keep all follow-up visits as told by your doctor. This is important. Contact a doctor if:  You have pain that does not get better with rest or medicine. Get help right away if:  One or both of your arms or legs feel weak.  One or both of your arms or legs lose feeling (numbness).  You have trouble controlling when you poop (bowel movement) or pee (urinate).  You feel sick to your stomach (nauseous).  You throw up (vomit).  You have belly (abdominal) pain.  You have shortness of breath.  You pass out (faint). Summary  When back pain lasts longer than 3 months, it is called chronic back pain.  Pain may get worse at certain times (flare-ups).  Use ice and heat as told by your doctor. Your doctor may tell you to use ice after flare-ups. This information is not intended to replace advice given to you by your health care provider. Make sure you discuss any questions you have with your health care provider. Document  Released: 04/28/2008 Document Revised: 06/25/2017 Document Reviewed: 06/25/2017 Elsevier Interactive Patient Education  2019 [employer].

## 2018-12-21 ENCOUNTER — Ambulatory Visit: Payer: Self-pay

## 2018-12-31 ENCOUNTER — Other Ambulatory Visit: Payer: Self-pay

## 2018-12-31 DIAGNOSIS — I10 Essential (primary) hypertension: Secondary | ICD-10-CM | POA: Diagnosis not present

## 2018-12-31 DIAGNOSIS — E782 Mixed hyperlipidemia: Secondary | ICD-10-CM | POA: Diagnosis not present

## 2018-12-31 DIAGNOSIS — E039 Hypothyroidism, unspecified: Secondary | ICD-10-CM | POA: Diagnosis not present

## 2018-12-31 DIAGNOSIS — R7301 Impaired fasting glucose: Secondary | ICD-10-CM | POA: Diagnosis not present

## 2018-12-31 NOTE — Patient Outreach (Signed)
[facility]) Care Management  12/31/2018  Glenda Nolan [birth year] [id number]   Home visit with patient and her daughter, [name].  Patient does not wish to apply for Medicaid at this time so purpose of visit was to assist with completion of Advance Directives.  BSW discussed specifics of HC POA and Living Will and assisted them with completion of documents.  BSW discussed that last page must be signed in front of two witnesses and notary.  BSW encouraged them to keep original copy of document and request that provider upload to her EHR at her next MD appointment.  Patient and daughter both verbalized understanding of documentation, having it notarized, and ensuring it is added to her health record.  No other social work needs were identified so BSW is closing case at this time.  [name], BSW Social Worker [phone]

## 2019-01-04 ENCOUNTER — Ambulatory Visit (HOSPITAL_COMMUNITY): Payer: Medicare Other | Attending: Orthopedic Surgery

## 2019-01-04 ENCOUNTER — Encounter (HOSPITAL_COMMUNITY): Payer: Self-pay

## 2019-01-04 ENCOUNTER — Other Ambulatory Visit: Payer: Self-pay

## 2019-01-04 DIAGNOSIS — M545 Low back pain, unspecified: Secondary | ICD-10-CM

## 2019-01-04 DIAGNOSIS — M6281 Muscle weakness (generalized): Secondary | ICD-10-CM

## 2019-01-04 DIAGNOSIS — G8929 Other chronic pain: Secondary | ICD-10-CM

## 2019-01-04 DIAGNOSIS — R29898 Other symptoms and signs involving the musculoskeletal system: Secondary | ICD-10-CM

## 2019-01-04 NOTE — Therapy (Signed)
[place], [place], [address] Phone: [phone]   Fax:  [phone]  Physical Therapy Evaluation  Patient Details  Name: Glenda Nolan MRN: [id number] Date of Birth: 1935/02/19 Referring Provider (PT): Arther Abbott, MD   Encounter Date: 01/04/2019  PT End of Session - 01/04/19 1615    Visit Number  1    Number of Visits  8    Date for PT Re-Evaluation  02/01/19    Authorization Type  [employer] Medicare    Authorization Time Period  01/04/19 to 02/01/19    Authorization - Visit Number  1    Authorization - Number of Visits  10    PT Start Time  1324    PT Stop Time  1559    PT Time Calculation (min)  41 min    Activity Tolerance  Patient tolerated treatment well    Behavior During Therapy  [facility] for tasks assessed/performed       Past Medical History:  Diagnosis Date  . Anemia   . Arthritis   . Cataracts, bilateral    pending surgery May 2013  . Colon cancer ([place]) 1994   Stage IIB  . Diverticulosis   . GERD (gastroesophageal reflux disease)   . HTN (hypertension)   . Hyperlipidemia   . Hypothyroidism   . Migraines    occasional migraines  . Osteoporosis     Past Surgical History:  Procedure Laterality Date  . CATARACT EXTRACTION W/PHACO Left 08/01/2013   Procedure: CATARACT EXTRACTION PHACO AND INTRAOCULAR LENS PLACEMENT (IOC);  Surgeon: Tonny Branch, MD;  Location: [facility];  Service: Ophthalmology;  Laterality: Left;  CDE:11.47  . CATARACT EXTRACTION W/PHACO Right 08/15/2013   Procedure: CATARACT EXTRACTION PHACO AND INTRAOCULAR LENS PLACEMENT (IOC);  Surgeon: Tonny Branch, MD;  Location: [facility];  Service: Ophthalmology;  Laterality: Right;  CDE:  16.81  . COLON SURGERY  1994   Right  . COLONOSCOPY  04/01/2012   Rourk-Colonic ulcers at the anastomosis likely NSAID related, although ischemia is not excluded, status post biopsy, left-sided diverticula.  Internal hemorrhoids and anal papilla  . COLONOSCOPY W/  BIOPSIES  05/04/09   Dr. Madolyn Frieze papilla, diverticulosis  . DILATION AND CURETTAGE OF UTERUS    . ESOPHAGOGASTRODUODENOSCOPY  04/01/2012   Rourk-Normal esophagus, small hiatal hernia, deformity, scarring friability of the antrum/prepyloric mucosa suggestive of prior peptic ulcer disease, normal D1 and D2.  . HEMORRHOID SURGERY    . knee arthrocopy(Right)    . TOTAL KNEE ARTHROPLASTY Left 03/18/2017   Procedure: TOTAL KNEE ARTHROPLASTY;  Surgeon: Carole Civil, MD;  Location: [facility];  Service: Orthopedics;  Laterality: Left;    There were no vitals filed for this visit.   Subjective Assessment - 01/04/19 1524    Subjective  Pt states that she has been having LBP in the center of her back from the bottom of her bra line down. She has been having this pain for a few months of insidious onset. She reports intermittent pain on the back side of her R leg but it does not go down her leg any furhter. This leg pain also hurts when she sits on it. She states that pain can limit her chores around the house. Her dtr reports that when the pt is standing trying to cook her back will bother her and sitting increases her leg pain.     How long can you sit comfortably?  30+ mins    How long  can you stand comfortably?  30 mins    How long can you walk comfortably?  no limited    Patient Stated Goals  reduce pain    Currently in Pain?  Yes    Pain Score  5     Pain Location  Hip    Pain Orientation  Right    Pain Descriptors / Indicators  Sore    Pain Onset  More than a month ago    Pain Frequency  Intermittent    Aggravating Factors   sitting    Pain Relieving Factors  unsure    Effect of Pain on Daily Activities  increases         [facility] PT Assessment - 01/04/19 0001      Assessment   Medical Diagnosis  Arthritis lumbar spine    Referring Provider (PT)  Arther Abbott, MD    Onset Date/Surgical Date  --   a few months ago   Next MD Visit  03/14/19    Prior Therapy  yes for TKA, none  for current issue      Balance Screen   Has the patient fallen in the past 6 months  No    Has the patient had a decrease in activity level because of a fear of falling?   No    Is the patient reluctant to leave their home because of a fear of falling?   No      Home Social worker  Private residence    Living Arrangements  Alone      Prior Function   Level of Independence  Independent;Independent with household mobility with device   not driving or shopping but cooking, cleaning, bathing indep   Leisure  watch TV, puzzle books      Observation/Other Assessments   Focus on Therapeutic Outcomes (FOTO)   n/a - pt unlikely to score accurately      ROM / Strength   AROM / PROM / Strength  AROM;Strength      AROM   AROM Assessment Site  Lumbar    Lumbar Flexion  WFL, pain    Lumbar Extension  25% limited    Lumbar - Right Side Bend  WFL, pain    Lumbar - Left Side Bend  [facility]      Strength   Strength Assessment Site  Hip;Knee;Ankle    Right Hip Flexion  4+/5    Right Hip Extension  2+/5    Right Hip ABduction  3-/5    Left Hip Flexion  4+/5    Left Hip Extension  3-/5    Left Hip ABduction  3/5    Right Knee Flexion  3+/5    Right Knee Extension  4+/5    Left Knee Flexion  4/5    Left Knee Extension  4+/5    Right Ankle Dorsiflexion  5/5    Left Ankle Dorsiflexion  4+/5      Flexibility   Soft Tissue Assessment /Muscle Length  yes    Hamstrings  WNL BLE, SLR and 90/90      Palpation   Palpation comment  increased restrictions throughout lumbar paraspinals and bil glutes, R>L      Balance   Balance Assessed  Yes      Static Standing Balance   Static Standing - Balance Support  No upper extremity supported    Static Standing Balance -  Activities   Single Leg Stance - Right Leg;Single Leg Stance -  Left Leg    Static Standing - Comment/# of Minutes  R: 1 sec or <, L: 2 sec or <      Standardized Balance Assessment   Standardized Balance Assessment   Five Times Sit to Stand    Five times sit to stand comments   23sec, chair, BUE on chair         Objective measurements completed on examination: See above findings.       PT Education - 01/04/19 1615    Education Details  exam findings, HEP, POC    Person(s) Educated  Patient;Child(ren)    Methods  Explanation;Demonstration;Handout    Comprehension  Verbalized understanding       PT Short Term Goals - 01/04/19 1617      PT SHORT TERM GOAL #1   Title  Pt will be independent with HEP and perform consistently in order to maximize her overall therapeutic benefit and reduce pain.     Time  2    Period  Weeks    Status  New    Target Date  01/18/19      PT SHORT TERM GOAL #2   Title  Pt will have improved proximal hip to 3/5 in order to reduce LBP and maximize her gait.     Time  2    Period  Weeks    Status  New      PT SHORT TERM GOAL #3   Title  Pt will be able to perform bil SLS for 5 sec or > in order to demo improved functional hip strength and balance.     Time  2    Period  Weeks    Status  New        PT Long Term Goals - 01/04/19 1618      PT LONG TERM GOAL #1   Title  Pt will have improved proximal hip strength to 3+/5 in order to further reduce LBP and maximize her ability to stand for longer periods of time.     Time  4    Period  Weeks    Status  New    Target Date  02/01/19      PT LONG TERM GOAL #2   Title  Pt will be able to perform 5xSTS in 16sec or < without UE to demo improved functional strength and balance.     Time  4    Period  Weeks    Status  New      PT LONG TERM GOAL #3   Title  Pt will report being able to stand for 20 mins before needing to sit due to pain in order to allow her to cook without needing as many rest breaks.     Time  4    Period  Weeks    Status  New      PT LONG TERM GOAL #4   Title  Pt will report min to no LBP or R hip pain with sitting or standing to demo improved overall function.     Time  4    Period   Weeks    Status  New             Plan - 01/04/19 1616    Clinical Impression Statement  Pt is pleasant 84YO F who presents to OPPT with c/o LBP and R buttock/hip pain. Pt's dtr supplemented history and pt's c/c. Pt presents with deficits in proximal hip mm, functional strength,  functional mobility, endurance, and increased soft tissue restrictions throughout paraspinals and hip mm. Pt reporting pain in R hip with sitting but unable to pinpoint where or how long before she feels it. Pt unable to maintain SLS for > 2 sec on either LE and she required 23sec to perform 5xSTS, which indicates reduced functional strength and increases her risk for falls. Pt's dtr verbalizing that pt has h/o non-compliance with therapy so PT strongly educated on benefits of therapy and importance of compliance to the most beneficial therapeutic benefit and she verbalized understanding. Pt needs skilled PT intervention in order to reduce her pain and maximize her overall function.     Clinical Presentation  Stable    Clinical Presentation due to:  see flowsheets for objective tests and measures    Clinical Decision Making  Low    Rehab Potential  Fair    PT Frequency  2x / week    PT Duration  4 weeks    PT Treatment/Interventions  ADLs/Self Care Home Management;Aquatic Therapy;Cryotherapy;Electrical Stimulation;Moist Heat;Ultrasound;DME Instruction;Stair training;Functional mobility training;Gait training;Therapeutic activities;Therapeutic exercise;Balance training;Neuromuscular re-education;Patient/family education;Manual techniques;Scar mobilization;Passive range of motion;Dry needling;Taping    PT Next Visit Plan  reveiw goals; review HEP, begin lumbar and hip stretching as well as BLE, funcitonal, core strengthening    PT Home Exercise Plan  eval: SKTC, bridging, sidleying clams    Consulted and Agree with Plan of Care  Patient;Family member/caregiver    Family Member Consulted  dtr       Patient will benefit  from skilled therapeutic intervention in order to improve the following deficits and impairments:  Abnormal gait, Decreased activity tolerance, Decreased balance, Decreased endurance, Decreased range of motion, Decreased strength, Difficulty walking, Decreased mobility, Hypomobility, Increased fascial restricitons, Increased muscle spasms, Impaired flexibility, Improper body mechanics, Postural dysfunction, Pain  Visit Diagnosis: Chronic midline low back pain, unspecified whether sciatica present - Plan: PT plan of care cert/re-cert  Muscle weakness (generalized) - Plan: PT plan of care cert/re-cert  Other symptoms and signs involving the musculoskeletal system - Plan: PT plan of care cert/re-cert     Problem List Patient Active Problem List   Diagnosis Date Noted  . S/P TKR (total knee replacement), left 03/18/2017 03/18/2017  . Heme positive stool 03/22/2012  . Melena 03/22/2012  . GERD (gastroesophageal reflux disease) 03/22/2012  . CARCINOMA, COLON 04/26/2009  . MIGRAINE HEADACHE 04/26/2009  . HYPERTENSION 04/26/2009  . DEGENERATIVE JOINT DISEASE 04/26/2009  . IRON DEFICIENCY ANEMIA, HX OF 04/26/2009  . KNEE, ARTHRITIS, DEGEN./OSTEO 03/01/2009  . KNEE PAIN 03/01/2009        Geraldine Solar PT, DPT  [place] [address] [place], [place], [address] Phone: [phone]   Fax:  [phone]  Name: Glenda Nolan MRN: [id number] Date of Birth: August 09, 1935

## 2019-01-06 ENCOUNTER — Encounter (HOSPITAL_COMMUNITY): Payer: Self-pay | Admitting: Physical Therapy

## 2019-01-06 ENCOUNTER — Ambulatory Visit (HOSPITAL_COMMUNITY): Payer: Medicare Other | Admitting: Physical Therapy

## 2019-01-06 DIAGNOSIS — G8929 Other chronic pain: Secondary | ICD-10-CM

## 2019-01-06 DIAGNOSIS — M545 Low back pain: Secondary | ICD-10-CM | POA: Diagnosis not present

## 2019-01-06 DIAGNOSIS — R29898 Other symptoms and signs involving the musculoskeletal system: Secondary | ICD-10-CM

## 2019-01-06 DIAGNOSIS — M6281 Muscle weakness (generalized): Secondary | ICD-10-CM | POA: Diagnosis not present

## 2019-01-06 NOTE — Therapy (Signed)
[place], [place], [address] Phone: [phone]   Fax:  [phone]  Physical Therapy Treatment  Patient Details  Name: Glenda Nolan MRN: [id number] Date of Birth: 16-Nov-1935 Referring Provider (PT): Arther Abbott, MD   Encounter Date: 01/06/2019  PT End of Session - 01/06/19 1529    Visit Number  2    Number of Visits  8    Date for PT Re-Evaluation  02/01/19    Authorization Type  [employer] Medicare    Authorization Time Period  01/04/19 to 02/01/19    Authorization - Visit Number  2    Authorization - Number of Visits  10    PT Start Time  1430    PT Stop Time  1511    PT Time Calculation (min)  41 min    Activity Tolerance  Patient tolerated treatment well    Behavior During Therapy  [facility] for tasks assessed/performed       Past Medical History:  Diagnosis Date  . Anemia   . Arthritis   . Cataracts, bilateral    pending surgery May 2013  . Colon cancer ([place]) 1994   Stage IIB  . Diverticulosis   . GERD (gastroesophageal reflux disease)   . HTN (hypertension)   . Hyperlipidemia   . Hypothyroidism   . Migraines    occasional migraines  . Osteoporosis     Past Surgical History:  Procedure Laterality Date  . CATARACT EXTRACTION W/PHACO Left 08/01/2013   Procedure: CATARACT EXTRACTION PHACO AND INTRAOCULAR LENS PLACEMENT (IOC);  Surgeon: Tonny Branch, MD;  Location: [facility];  Service: Ophthalmology;  Laterality: Left;  CDE:11.47  . CATARACT EXTRACTION W/PHACO Right 08/15/2013   Procedure: CATARACT EXTRACTION PHACO AND INTRAOCULAR LENS PLACEMENT (IOC);  Surgeon: Tonny Branch, MD;  Location: [facility];  Service: Ophthalmology;  Laterality: Right;  CDE:  16.81  . COLON SURGERY  1994   Right  . COLONOSCOPY  04/01/2012   Rourk-Colonic ulcers at the anastomosis likely NSAID related, although ischemia is not excluded, status post biopsy, left-sided diverticula.  Internal hemorrhoids and anal papilla  . COLONOSCOPY W/  BIOPSIES  05/04/09   Dr. Madolyn Frieze papilla, diverticulosis  . DILATION AND CURETTAGE OF UTERUS    . ESOPHAGOGASTRODUODENOSCOPY  04/01/2012   Rourk-Normal esophagus, small hiatal hernia, deformity, scarring friability of the antrum/prepyloric mucosa suggestive of prior peptic ulcer disease, normal D1 and D2.  . HEMORRHOID SURGERY    . knee arthrocopy(Right)    . TOTAL KNEE ARTHROPLASTY Left 03/18/2017   Procedure: TOTAL KNEE ARTHROPLASTY;  Surgeon: Carole Civil, MD;  Location: [facility];  Service: Orthopedics;  Laterality: Left;    There were no vitals filed for this visit.  Subjective Assessment - 01/06/19 1436    Subjective  Patient stated that she has pain in her right glutes. She stated she has not done the exercises.     How long can you sit comfortably?  30+ mins    How long can you stand comfortably?  30 mins    How long can you walk comfortably?  no limited    Patient Stated Goals  reduce pain    Currently in Pain?  Yes    Pain Score  5     Pain Location  Buttocks    Pain Orientation  Right    Pain Descriptors / Indicators  Sharp    Pain Onset  More than a month ago  [facility] PT Treatment/Exercise - 01/06/19 0001      Exercises   Exercises  Lumbar      Lumbar Exercises: Stretches   Single Knee to Chest Stretch  Right;Left;3 reps;20 seconds    Single Knee to Chest Stretch Limitations  using towel      Lumbar Exercises: Supine   Ab Set  10 reps    AB Set Limitations  3'' holds VCs for form    Bridge  10 reps;3 seconds      Lumbar Exercises: Sidelying   Clam  Right;Left;10 reps      Manual Therapy   Manual Therapy  Soft tissue mobilization    Manual therapy comments  All manual therapy completed separately from all other skilled intervention    Soft tissue mobilization  Patient in left sidelying with pillow between knees, therapist providing IASTM using green weighted ball to patient's right gluteals and around hamstring  insertion for pain reduction               PT Short Term Goals - 01/06/19 1437      PT SHORT TERM GOAL #1   Title  Pt will be independent with HEP and perform consistently in order to maximize her overall therapeutic benefit and reduce pain.     Time  2    Period  Weeks    Status  On-going    Target Date  01/18/19      PT SHORT TERM GOAL #2   Title  Pt will have improved proximal hip to 3/5 in order to reduce LBP and maximize her gait.     Time  2    Period  Weeks    Status  On-going      PT SHORT TERM GOAL #3   Title  Pt will be able to perform bil SLS for 5 sec or > in order to demo improved functional hip strength and balance.     Time  2    Period  Weeks    Status  On-going        PT Long Term Goals - 01/06/19 1439      PT LONG TERM GOAL #1   Title  Pt will have improved proximal hip strength to 3+/5 in order to further reduce LBP and maximize her ability to stand for longer periods of time.     Time  4    Period  Weeks    Status  On-going      PT LONG TERM GOAL #2   Title  Pt will be able to perform 5xSTS in 16sec or < without UE to demo improved functional strength and balance.     Time  4    Period  Weeks    Status  On-going      PT LONG TERM GOAL #3   Title  Pt will report being able to stand for 20 mins before needing to sit due to pain in order to allow her to cook without needing as many rest breaks.     Time  4    Period  Weeks    Status  On-going      PT LONG TERM GOAL #4   Title  Pt will report min to no LBP or R hip pain with sitting or standing to demo improved overall function.     Time  4    Period  Weeks    Status  On-going  Plan - 01/06/19 1527    Clinical Impression Statement  Began the session by reviewing patient's evaluation and goals with patient and patient's daughter giving consent to these goals. Then reviewed patient's home exercise program with patient requiring frequent verbal and tactile cues for form.  Then therapist provided abdominal strengthening exercise with cues. Finally ended session with soft tissue mobilization to decrease patient's pain, with patient reporting feeling better at end of session, although she did not quantify her pain level. Patient would benefit from continued skilled physical therapy in order to continue progressing towards functional goals.     Rehab Potential  Fair    PT Frequency  2x / week    PT Duration  4 weeks    PT Treatment/Interventions  ADLs/Self Care Home Management;Aquatic Therapy;Cryotherapy;Electrical Stimulation;Moist Heat;Ultrasound;DME Instruction;Stair training;Functional mobility training;Gait training;Therapeutic activities;Therapeutic exercise;Balance training;Neuromuscular re-education;Patient/family education;Manual techniques;Scar mobilization;Passive range of motion;Dry needling;Taping    PT Next Visit Plan  review HEP again as necessary, begin lumbar and hip stretching as well as BLE, funcitonal, core strengthening. Follow-up on effects of manaul and continue if positive response.     PT Home Exercise Plan  eval: SKTC, bridging, sidleying clams    Consulted and Agree with Plan of Care  Patient;Family member/caregiver    Family Member Consulted  dtr       Patient will benefit from skilled therapeutic intervention in order to improve the following deficits and impairments:  Abnormal gait, Decreased activity tolerance, Decreased balance, Decreased endurance, Decreased range of motion, Decreased strength, Difficulty walking, Decreased mobility, Hypomobility, Increased fascial restricitons, Increased muscle spasms, Impaired flexibility, Improper body mechanics, Postural dysfunction, Pain  Visit Diagnosis: Chronic midline low back pain, unspecified whether sciatica present  Muscle weakness (generalized)  Other symptoms and signs involving the musculoskeletal system     Problem List Patient Active Problem List   Diagnosis Date Noted  . S/P  TKR (total knee replacement), left 03/18/2017 03/18/2017  . Heme positive stool 03/22/2012  . Melena 03/22/2012  . GERD (gastroesophageal reflux disease) 03/22/2012  . CARCINOMA, COLON 04/26/2009  . MIGRAINE HEADACHE 04/26/2009  . HYPERTENSION 04/26/2009  . DEGENERATIVE JOINT DISEASE 04/26/2009  . IRON DEFICIENCY ANEMIA, HX OF 04/26/2009  . KNEE, ARTHRITIS, DEGEN./OSTEO 03/01/2009  . KNEE PAIN 03/01/2009   Clarene Critchley PT, DPT 3:30 PM, 01/06/19 [place], [place], [address] Phone: [phone]   Fax:  [phone]  Name: Glenda Nolan MRN: [id number] Date of Birth: [birth year]

## 2019-01-11 ENCOUNTER — Telehealth (HOSPITAL_COMMUNITY): Payer: Self-pay

## 2019-01-11 ENCOUNTER — Ambulatory Visit (HOSPITAL_COMMUNITY): Payer: Medicare Other

## 2019-01-11 NOTE — Telephone Encounter (Signed)
Caregiver called to cx due to being sick this morning

## 2019-01-13 ENCOUNTER — Telehealth (HOSPITAL_COMMUNITY): Payer: Self-pay | Admitting: Internal Medicine

## 2019-01-13 ENCOUNTER — Ambulatory Visit (HOSPITAL_COMMUNITY): Payer: Medicare Other

## 2019-01-13 NOTE — Telephone Encounter (Signed)
01/13/19  caller said she needed to cx this appt but no reason was given

## 2019-01-18 ENCOUNTER — Ambulatory Visit (HOSPITAL_COMMUNITY): Payer: Medicare Other

## 2019-01-18 ENCOUNTER — Encounter (HOSPITAL_COMMUNITY): Payer: Self-pay

## 2019-01-18 DIAGNOSIS — G8929 Other chronic pain: Secondary | ICD-10-CM | POA: Diagnosis not present

## 2019-01-18 DIAGNOSIS — R29898 Other symptoms and signs involving the musculoskeletal system: Secondary | ICD-10-CM

## 2019-01-18 DIAGNOSIS — M6281 Muscle weakness (generalized): Secondary | ICD-10-CM | POA: Diagnosis not present

## 2019-01-18 DIAGNOSIS — M545 Low back pain: Principal | ICD-10-CM

## 2019-01-18 NOTE — Therapy (Signed)
[place], [place], [address] Phone: [phone]   Fax:  [phone]   Progress Note Reporting Period 01/04/19 to 01/18/19  See note below for Objective Data and Assessment of Progress/Goals.   Physical Therapy Treatment  Patient Details  Name: Glenda Nolan MRN: [id number] Date of Birth: 12-10-1934 Referring Provider (PT): Arther Abbott, MD   Encounter Date: 01/18/2019  PT End of Session - 01/18/19 1510    Visit Number  3    Number of Visits  8    Date for PT Re-Evaluation  02/15/19    Authorization Type  [employer]HC Medicare    Authorization Time Period  01/04/19 to 02/01/19; NEW HEP POC 01/18/19 to 02/15/19    Authorization - Visit Number  3    Authorization - Number of Visits  10    PT Start Time  1510    PT Stop Time  1535    PT Time Calculation (min)  25 min    Activity Tolerance  Patient tolerated treatment well    Behavior During Therapy  [facility] for tasks assessed/performed       Past Medical History:  Diagnosis Date  . Anemia   . Arthritis   . Cataracts, bilateral    pending surgery May 2013  . Colon cancer ([place]) 1994   Stage IIB  . Diverticulosis   . GERD (gastroesophageal reflux disease)   . HTN (hypertension)   . Hyperlipidemia   . Hypothyroidism   . Migraines    occasional migraines  . Osteoporosis     Past Surgical History:  Procedure Laterality Date  . CATARACT EXTRACTION W/PHACO Left 08/01/2013   Procedure: CATARACT EXTRACTION PHACO AND INTRAOCULAR LENS PLACEMENT (IOC);  Surgeon: Tonny Branch, MD;  Location: [facility];  Service: Ophthalmology;  Laterality: Left;  CDE:11.47  . CATARACT EXTRACTION W/PHACO Right 08/15/2013   Procedure: CATARACT EXTRACTION PHACO AND INTRAOCULAR LENS PLACEMENT (IOC);  Surgeon: Tonny Branch, MD;  Location: [facility];  Service: Ophthalmology;  Laterality: Right;  CDE:  16.81  . COLON SURGERY  1994   Right  . COLONOSCOPY  04/01/2012   Rourk-Colonic ulcers at the anastomosis  likely NSAID related, although ischemia is not excluded, status post biopsy, left-sided diverticula.  Internal hemorrhoids and anal papilla  . COLONOSCOPY W/ BIOPSIES  05/04/09   Dr. Madolyn Frieze papilla, diverticulosis  . DILATION AND CURETTAGE OF UTERUS    . ESOPHAGOGASTRODUODENOSCOPY  04/01/2012   Rourk-Normal esophagus, small hiatal hernia, deformity, scarring friability of the antrum/prepyloric mucosa suggestive of prior peptic ulcer disease, normal D1 and D2.  . HEMORRHOID SURGERY    . knee arthrocopy(Right)    . TOTAL KNEE ARTHROPLASTY Left 03/18/2017   Procedure: TOTAL KNEE ARTHROPLASTY;  Surgeon: Carole Civil, MD;  Location: [facility];  Service: Orthopedics;  Laterality: Left;    There were no vitals filed for this visit.  Subjective Assessment - 01/18/19 1513    Subjective  Pt states that she wants today to be her last day because she is feeling better but she states that she has not been doing her HEP.     How long can you sit comfortably?  30+ mins    How long can you stand comfortably?  30 mins    How long can you walk comfortably?  no limited    Patient Stated Goals  reduce pain    Currently in Pain?  No/denies    Pain Onset  More than a month ago  [facility] PT Assessment - 01/18/19 0001      Assessment   Medical Diagnosis  Arthritis lumbar spine    Referring Provider (PT)  Arther Abbott, MD    Onset Date/Surgical Date  --   a few months ago   Next MD Visit  03/14/19    Prior Therapy  yes for TKA, none for current issue      Strength   Right Hip Extension  2+/5   was 2+   Right Hip ABduction  3/5   was 3-   Left Hip Extension  3-/5   was 3-   Left Hip ABduction  3/5   was 3   Right Knee Flexion  3+/5   was 3+     Balance   Balance Assessed  Yes      Static Standing Balance   Static Standing - Balance Support  No upper extremity supported    Static Standing Balance -  Activities   Single Leg Stance - Right Leg;Single Leg Stance - Left Leg     Static Standing - Comment/# of Minutes  R: 1 sec or < L: 3sec or <   was 1sec or < on R, 2 sec or < on L     Standardized Balance Assessment   Standardized Balance Assessment  Five Times Sit to Stand    Five times sit to stand comments   17.5sec, chair, hands on thighs   was 23sec, chair, BUE           PT Education - 01/18/19 1543    Education Details  importance of maintaining HEP; benefits of continuing therapy, updated HEP    Person(s) Educated  Patient;Child(ren)    Methods  Explanation;Demonstration;Handout    Comprehension  Verbalized understanding           PT Short Term Goals - 01/18/19 1517      PT SHORT TERM GOAL #1   Title  Pt will be independent with HEP and perform consistently in order to maximize her overall therapeutic benefit and reduce pain.     Time  2    Period  Weeks    Status  On-going    Target Date  01/18/19      PT SHORT TERM GOAL #2   Title  Pt will have improved proximal hip to 3/5 in order to reduce LBP and maximize her gait.     Baseline  2/25: see MMT    Time  2    Period  Weeks    Status  Partially Met      PT SHORT TERM GOAL #3   Title  Pt will be able to perform bil SLS for 5 sec or > in order to demo improved functional hip strength and balance.     Baseline  2/25: 1 sec or < R, 3 sec or < L    Time  2    Period  Weeks    Status  On-going        PT Long Term Goals - 01/18/19 1517      PT LONG TERM GOAL #1   Title  Pt will have improved proximal hip strength to 3+/5 in order to further reduce LBP and maximize her ability to stand for longer periods of time.     Baseline  2/25: see MMT    Time  4    Period  Weeks    Status  On-going      PT LONG TERM  GOAL #2   Title  Pt will be able to perform 5xSTS in 16sec or < without UE to demo improved functional strength and balance.     Time  4    Period  Weeks    Status  Partially Met      PT LONG TERM GOAL #3   Title  Pt will report being able to stand for 20 mins before  needing to sit due to pain in order to allow her to cook without needing as many rest breaks.     Baseline  2/25: pt reports 34mns    Time  4    Period  Weeks    Status  Achieved      PT LONG TERM GOAL #4   Title  Pt will report min to no LBP or R hip pain with sitting or standing to demo improved overall function.     Baseline  2/25: right now her lower back and R hip aren't bothering her    Time  4    Period  Weeks    Status  Achieved            Plan - 01/18/19 1540    Clinical Impression Statement  Pt presents to therapy stating that she wishes for today to be her last day because she is feeling better. PT reassessed goals and outcome measures and while pt has actually made some progress, she is still significantly limited in her proximal hip strength and balance. She also reports non-compliance with HEP. PT recommending HEP POC for 4-weeks to encourage pt to manage independently at home and if she notices a significant decline, then she can return for a reassessment, however, if she still feels fine with where she is at and her pain is still at a minimum, then she can call and be d/c at that time. Pt and her dtr agreeable to this plan. Updated HEP to include gluteal strengthening and balance and educated pt on how to perform at home and she and her dtr verbalized understanding.     Rehab Potential  Fair    PT Frequency  2x / week    PT Duration  4 weeks    PT Treatment/Interventions  ADLs/Self Care Home Management;Aquatic Therapy;Cryotherapy;Electrical Stimulation;Moist Heat;Ultrasound;DME Instruction;Stair training;Functional mobility training;Gait training;Therapeutic activities;Therapeutic exercise;Balance training;Neuromuscular re-education;Patient/family education;Manual techniques;Scar mobilization;Passive range of motion;Dry needling;Taping    PT Next Visit Plan  f/u after 1 month HEP POC; review HEP again as necessary, begin lumbar and hip stretching as well as BLE, funcitonal,  core strengthening. Follow-up on effects of manaul and continue if positive response.     PT Home Exercise Plan  eval: SKTC, bridging, sidleying clams; 2/25: sidelying hip abd, STS, st hip abd without/with RTB, sidestepping without/with RTB, SLS    Consulted and Agree with Plan of Care  Patient;Family member/caregiver    Family Member Consulted  dtr       Patient will benefit from skilled therapeutic intervention in order to improve the following deficits and impairments:  Abnormal gait, Decreased activity tolerance, Decreased balance, Decreased endurance, Decreased range of motion, Decreased strength, Difficulty walking, Decreased mobility, Hypomobility, Increased fascial restricitons, Increased muscle spasms, Impaired flexibility, Improper body mechanics, Postural dysfunction, Pain  Visit Diagnosis: Chronic midline low back pain, unspecified whether sciatica present - Plan: PT plan of care cert/re-cert  Muscle weakness (generalized) - Plan: PT plan of care cert/re-cert  Other symptoms and signs involving the musculoskeletal system - Plan: PT plan of care cert/re-cert  Problem List Patient Active Problem List   Diagnosis Date Noted  . S/P TKR (total knee replacement), left 03/18/2017 03/18/2017  . Heme positive stool 03/22/2012  . Melena 03/22/2012  . GERD (gastroesophageal reflux disease) 03/22/2012  . CARCINOMA, COLON 04/26/2009  . MIGRAINE HEADACHE 04/26/2009  . HYPERTENSION 04/26/2009  . DEGENERATIVE JOINT DISEASE 04/26/2009  . IRON DEFICIENCY ANEMIA, HX OF 04/26/2009  . KNEE, ARTHRITIS, DEGEN./OSTEO 03/01/2009  . KNEE PAIN 03/01/2009        Geraldine Solar PT, DPT   [place] [address] [place], [place], [address] Phone: [phone]   Fax:  [phone]  Name: Glenda Nolan MRN: [id number] Date of Birth: [birth year]

## 2019-01-20 ENCOUNTER — Ambulatory Visit (HOSPITAL_COMMUNITY): Payer: Medicare Other

## 2019-01-24 DIAGNOSIS — E039 Hypothyroidism, unspecified: Secondary | ICD-10-CM | POA: Diagnosis not present

## 2019-01-24 DIAGNOSIS — I10 Essential (primary) hypertension: Secondary | ICD-10-CM | POA: Diagnosis not present

## 2019-01-24 DIAGNOSIS — E782 Mixed hyperlipidemia: Secondary | ICD-10-CM | POA: Diagnosis not present

## 2019-01-24 DIAGNOSIS — R7301 Impaired fasting glucose: Secondary | ICD-10-CM | POA: Diagnosis not present

## 2019-01-25 ENCOUNTER — Ambulatory Visit (HOSPITAL_COMMUNITY): Payer: Medicare Other

## 2019-01-27 ENCOUNTER — Encounter (HOSPITAL_COMMUNITY): Payer: Self-pay

## 2019-02-11 ENCOUNTER — Telehealth (HOSPITAL_COMMUNITY): Payer: Self-pay

## 2019-02-11 NOTE — Telephone Encounter (Signed)
Called and spoke to Mrs. Glenda Nolan on behalf of her mother, Mrs. Glenda Nolan. Educated her that we have closed the clinic for 2 weeks due to COVID-19 and asked if she wanted to reschedule her mother, wait till we reopen to call and reschedule, or go ahead and d/c pt. Mrs. Glenda Nolan said to go ahead and d/c the pt. All other questions and regards addressed.   Geraldine Solar PT, DPT

## 2019-02-16 ENCOUNTER — Ambulatory Visit (HOSPITAL_COMMUNITY): Payer: Medicare Other

## 2019-03-01 DIAGNOSIS — E039 Hypothyroidism, unspecified: Secondary | ICD-10-CM | POA: Diagnosis not present

## 2019-03-01 DIAGNOSIS — E782 Mixed hyperlipidemia: Secondary | ICD-10-CM | POA: Diagnosis not present

## 2019-03-01 DIAGNOSIS — R7301 Impaired fasting glucose: Secondary | ICD-10-CM | POA: Diagnosis not present

## 2019-03-01 DIAGNOSIS — I10 Essential (primary) hypertension: Secondary | ICD-10-CM | POA: Diagnosis not present

## 2019-03-08 ENCOUNTER — Encounter (HOSPITAL_COMMUNITY): Payer: Self-pay

## 2019-03-08 NOTE — Therapy (Signed)
[place], [place], [address] Phone: [phone]   Fax:  [phone]  Patient Details  Name: Glenda Nolan MRN: [id number] Date of Birth: 1935-04-20 Referring Provider:  No ref. provider found  Encounter Date: 03/08/2019  PHYSICAL THERAPY DISCHARGE SUMMARY  Visits from Start of Care: 3  Current functional level related to goals / functional outcomes: See last treatment note   Remaining deficits: See last treatment note   Education / Equipment: See last treatment note  Plan: Patient agrees to discharge.  Patient goals were partially met. Patient is being discharged due to the patient's request.  ?????     Geraldine Solar PT, [place] [address] [place], [place], [address] Phone: [phone]   Fax:  [phone]

## 2019-03-14 ENCOUNTER — Ambulatory Visit: Payer: Self-pay | Admitting: Orthopedic Surgery

## 2019-03-16 ENCOUNTER — Ambulatory Visit: Payer: Self-pay | Admitting: Orthopedic Surgery

## 2019-03-31 DIAGNOSIS — E782 Mixed hyperlipidemia: Secondary | ICD-10-CM | POA: Diagnosis not present

## 2019-03-31 DIAGNOSIS — I10 Essential (primary) hypertension: Secondary | ICD-10-CM | POA: Diagnosis not present

## 2019-03-31 DIAGNOSIS — R7301 Impaired fasting glucose: Secondary | ICD-10-CM | POA: Diagnosis not present

## 2019-03-31 DIAGNOSIS — E039 Hypothyroidism, unspecified: Secondary | ICD-10-CM | POA: Diagnosis not present

## 2019-04-01 DIAGNOSIS — Z0000 Encounter for general adult medical examination without abnormal findings: Secondary | ICD-10-CM | POA: Diagnosis not present

## 2019-04-06 DIAGNOSIS — R600 Localized edema: Secondary | ICD-10-CM | POA: Diagnosis not present

## 2019-04-13 ENCOUNTER — Other Ambulatory Visit: Payer: Self-pay | Admitting: Orthopedic Surgery

## 2019-04-13 ENCOUNTER — Encounter: Payer: Self-pay | Admitting: Orthopedic Surgery

## 2019-04-13 ENCOUNTER — Ambulatory Visit: Payer: Medicare Other | Admitting: Orthopedic Surgery

## 2019-04-13 ENCOUNTER — Other Ambulatory Visit: Payer: Self-pay

## 2019-04-13 ENCOUNTER — Ambulatory Visit (INDEPENDENT_AMBULATORY_CARE_PROVIDER_SITE_OTHER): Payer: Medicare Other

## 2019-04-13 VITALS — BP 108/78 | HR 70 | Temp 97.3°F | Ht 64.0 in | Wt 157.0 lb

## 2019-04-13 DIAGNOSIS — R229 Localized swelling, mass and lump, unspecified: Secondary | ICD-10-CM | POA: Diagnosis not present

## 2019-04-13 DIAGNOSIS — M545 Low back pain: Secondary | ICD-10-CM

## 2019-04-13 DIAGNOSIS — Z96652 Presence of left artificial knee joint: Secondary | ICD-10-CM

## 2019-04-13 DIAGNOSIS — I10 Essential (primary) hypertension: Secondary | ICD-10-CM | POA: Diagnosis not present

## 2019-04-13 DIAGNOSIS — G8929 Other chronic pain: Secondary | ICD-10-CM | POA: Diagnosis not present

## 2019-04-13 NOTE — Progress Notes (Signed)
ANNUAL FOLLOW UP FOR  left TKA   Chief Complaint  Patient presents with  . Routine Post Op    Left knee DOS 03/18/17     HPI: The patient is here for the annual  follow-up x-ray for knee replacement. The patient is not complaining of pain weakness instability or stiffness in the repaired knee.   Review of Systems  Constitutional: Negative for fever.  Musculoskeletal: Positive for back pain.  Neurological: Positive for weakness.   Past Medical History:  Diagnosis Date  . Anemia   . Arthritis   . Cataracts, bilateral    pending surgery May 2013  . Colon cancer ([place]) 1994   Stage IIB  . Diverticulosis   . GERD (gastroesophageal reflux disease)   . HTN (hypertension)   . Hyperlipidemia   . Hypothyroidism   . Migraines    occasional migraines  . Osteoporosis      Examination of the left KNEE  BP 108/78   Pulse 70   Ht 5\' 4"  (1.626 m)   Wt 157 lb (71.2 kg)   BMI 26.95 kg/m   General the patient is normally groomed in no distress  Inspection shows : incision healed nicely without erythema, no tenderness no swelling  Range of motion total range of motion is 120 degrees  Stability the knee is stable anterior to posterior as well as medial to lateral  Strength quadriceps strength is normal  Skin no erythema around the skin incision  Neuro: normal sensation in the operative leg  Gait: abnormal require a cane but that is for the back pain and leg symptoms   Medical decision-making section  X-rays ordered, internal imaging shows (see full dictated report) stable implant with no signs of loosening  CLINICAL DATA:  Chronic back pain.   EXAM: LUMBAR SPINE - COMPLETE 4+ VIEW   COMPARISON:  09/18/2008.   FINDINGS: Diffuse multilevel degenerative change lumbar spine with mild scoliosis concave left. Degenerative changes both hips. No acute bony abnormality identified. Pelvic calcifications consistent phleboliths. Aortoiliac atherosclerotic vascular calcification.  Surgical clips and sutures noted over the right abdomen.   IMPRESSION: 1. Diffuse multilevel degenerative change lumbar spine. Mild scoliosis lumbar spine concave left. Degenerative changes both hips. No acute bony abnormality.   2.  Aortoiliac atherosclerotic vascular disease.     Electronically Signed   By: Marcello Moores  Register   On: 12/02/2018 05:42  Diagnosis  Encounter Diagnoses  Name Primary?  . S/P TKR (total knee replacement), left 03/18/2017 Yes  . Chronic midline low back pain without sciatica     Addendum: from her last visit   84 years old had a left total knee replacement did well comes in with 4 to 6-week history of atraumatic onset of lower back pain and mid back pain with no history of weight loss fever chills malaise cancer.  Pain is unrelieved by massage pillow and Tylenol.  She takes omeprazole for reflux has not had any physical therapy or other treatment   Pain appears to be moderate constant with no associated red flags     Review of Systems  Gastrointestinal: Negative.   Genitourinary: Negative.   Neurological: Negative for tingling, tremors, sensory change, focal weakness and weakness.    Musculoskeletal:     Right hip: She exhibits normal range of motion, normal strength, no tenderness, no bony tenderness, no swelling, no crepitus, no deformity and no laceration.     Left hip: Normal. She exhibits normal range of motion, normal strength, no tenderness,  no bony tenderness, no swelling, no crepitus, no deformity and no laceration.       Back:  Skin:    General: Skin is warm and dry.  Neurological:     General: No focal deficit present.     Mental Status: She is alert and oriented to person, place, and time. Mental status is at baseline.     Sensory: No sensory deficit.     Motor: No weakness.     Coordination: Coordination normal.     Gait: Gait abnormal.     Deep Tendon Reflexes: Reflexes normal.    Plan follow-up 1 year repeat x-rays   Her  back is what is bothering her the most.  I asked her about it again if she was interested in epidural series and she said yes.  We have already done an x-ray that shows degenerative disc disease.  I do not think she needs surgery but she needs some pain relief so we will schedule her for MRI I will call her with the results and then we will schedule epidural series  Mri lumbar: needs epidurals for continued back pain

## 2019-04-13 NOTE — Patient Instructions (Signed)
Arrive at [facility] for MRI on May 27th at 11:30 the scan will be at [facility]. Wear loose fitting clothing with no metal / Dr Aline Brochure will call you back with the MRI scan report and we will set you up at [place] for injections after the scan.

## 2019-04-20 ENCOUNTER — Other Ambulatory Visit: Payer: Self-pay

## 2019-04-20 ENCOUNTER — Ambulatory Visit (HOSPITAL_COMMUNITY)
Admission: RE | Admit: 2019-04-20 | Discharge: 2019-04-20 | Disposition: A | Discharge status: Home / Self Care | Payer: Medicare Other | Source: Ambulatory Visit | Attending: Orthopedic Surgery | Admitting: Orthopedic Surgery

## 2019-04-20 DIAGNOSIS — M545 Low back pain: Secondary | ICD-10-CM | POA: Diagnosis not present

## 2019-04-20 DIAGNOSIS — G8929 Other chronic pain: Secondary | ICD-10-CM | POA: Diagnosis not present

## 2019-04-21 ENCOUNTER — Telehealth: Payer: Self-pay | Admitting: Radiology

## 2019-04-21 DIAGNOSIS — M545 Low back pain, unspecified: Secondary | ICD-10-CM

## 2019-04-21 DIAGNOSIS — G8929 Other chronic pain: Secondary | ICD-10-CM

## 2019-04-21 NOTE — Telephone Encounter (Signed)
-----   Message from Carole Civil, MD sent at 04/21/2019 11:41 AM EDT ----- I called her daughter. I gave her the results   She wants to set up esi   Call the daughter to arrange

## 2019-04-21 NOTE — Telephone Encounter (Signed)
Sent order called to advise to expect call from Dr Ernestina Patches regarding this.

## 2019-04-29 ENCOUNTER — Other Ambulatory Visit (HOSPITAL_COMMUNITY): Payer: Self-pay | Admitting: Internal Medicine

## 2019-04-29 DIAGNOSIS — Z1231 Encounter for screening mammogram for malignant neoplasm of breast: Secondary | ICD-10-CM

## 2019-05-04 ENCOUNTER — Ambulatory Visit (HOSPITAL_COMMUNITY): Payer: Medicare Other

## 2019-05-14 DIAGNOSIS — R7301 Impaired fasting glucose: Secondary | ICD-10-CM | POA: Diagnosis not present

## 2019-05-14 DIAGNOSIS — E039 Hypothyroidism, unspecified: Secondary | ICD-10-CM | POA: Diagnosis not present

## 2019-05-14 DIAGNOSIS — I10 Essential (primary) hypertension: Secondary | ICD-10-CM | POA: Diagnosis not present

## 2019-05-14 DIAGNOSIS — E782 Mixed hyperlipidemia: Secondary | ICD-10-CM | POA: Diagnosis not present

## 2019-05-23 ENCOUNTER — Ambulatory Visit (INDEPENDENT_AMBULATORY_CARE_PROVIDER_SITE_OTHER): Payer: Medicare Other | Admitting: Physical Medicine and Rehabilitation

## 2019-05-23 ENCOUNTER — Other Ambulatory Visit: Payer: Self-pay

## 2019-05-23 ENCOUNTER — Encounter: Payer: Self-pay | Admitting: Physical Medicine and Rehabilitation

## 2019-05-23 ENCOUNTER — Ambulatory Visit: Payer: Self-pay

## 2019-05-23 VITALS — BP 160/93 | HR 93 | Temp 97.6°F

## 2019-05-23 DIAGNOSIS — M48062 Spinal stenosis, lumbar region with neurogenic claudication: Secondary | ICD-10-CM

## 2019-05-23 DIAGNOSIS — M5416 Radiculopathy, lumbar region: Secondary | ICD-10-CM

## 2019-05-23 DIAGNOSIS — M5442 Lumbago with sciatica, left side: Secondary | ICD-10-CM

## 2019-05-23 DIAGNOSIS — G8929 Other chronic pain: Secondary | ICD-10-CM

## 2019-05-23 DIAGNOSIS — M47816 Spondylosis without myelopathy or radiculopathy, lumbar region: Secondary | ICD-10-CM

## 2019-05-23 MED ORDER — METHYLPREDNISOLONE ACETATE 80 MG/ML IJ SUSP
80.0000 mg | Freq: Once | INTRAMUSCULAR | Status: AC
  Administered 2019-05-23: 80 mg

## 2019-05-23 NOTE — Progress Notes (Signed)
Numeric Pain Rating Scale and Functional Assessment Average Pain 10   In the last MONTH (on 0-10 scale) has pain interfered with the following?  1. General activity like being  able to carry out your everyday physical activities such as walking, climbing stairs, carrying groceries, or moving a chair?  Rating(10)   +Driver, -BT, -Dye Allergies. 

## 2019-05-24 ENCOUNTER — Encounter: Payer: Self-pay | Admitting: Physical Medicine and Rehabilitation

## 2019-05-24 DIAGNOSIS — M47816 Spondylosis without myelopathy or radiculopathy, lumbar region: Secondary | ICD-10-CM | POA: Insufficient documentation

## 2019-05-24 DIAGNOSIS — M48062 Spinal stenosis, lumbar region with neurogenic claudication: Secondary | ICD-10-CM | POA: Insufficient documentation

## 2019-05-24 NOTE — Progress Notes (Signed)
[name] - 84 y.o. female MRN [id number]  Date of birth: 1935-01-29  Office Visit Note: Visit Date: 05/23/2019 PCP: Celene Squibb, MD Referred by: Celene Squibb, MD  Subjective: Chief Complaint  Patient presents with   Lower Back - Pain   Right Hip - Pain   Left Hip - Pain   Left Leg - Pain   Right Leg - Pain   HPI: Glenda Nolan is a 84 y.o. female who comes in today For evaluation and management and treatment of chronic low back pain and bilateral hip and leg pain at the rec [place] of Dr. Arther Abbott.  Patient is status post total knee replacement by Dr. Aline Brochure has been managing her orthopedic care for quite some time.  Recently she has had more increased low back pain and radicular type pain worse with standing and ambulating.  She has been in physical therapy at [facility] for this.  Her case is complicated by significant hypertension and remote history of colon cancer.  She also has a history of migraine headaches.  She has had no prior lumbar surgery or back surgery.  She had no specific injury or trauma.  She denies any specific weakness of the legs.  She says she does feel weak but she is not had anything like foot drop.  She is somewhat of a poor historian unfortunately I think her daughter is in the car today but the patient is able to give me a decent history.  She reports physical therapy has helped to a degree but still having pain down the legs.  Dr. Aline Brochure did obtain MRI of the lumbar spine and this is reviewed below.  This is somewhat problematic and it there is multilevel multifactorial issues but without high-grade stenosis.  There is L2-3 moderate central canal stenosis and multilevel facet arthropathy.  At L4-5 there is a right-sided facet joint cyst impacting the lateral recess and at L5-S1 there is a facet joint cyst on the left encroaching upon the foramen.  All of these are multiple of these could cause some symptoms.  She takes Tylenol for pain which is not  very beneficial at this point.  She really is unable to take anti-inflammatories do to hypertension and prior history of colon cancer.  Small course of anti-inflammatory probably would not be bad but clearly cannot take that all the time.  She is not on any other medications.  Review of Systems  Constitutional: Negative for chills, fever, malaise/fatigue and weight loss.  HENT: Negative for hearing loss and sinus pain.   Eyes: Negative for blurred vision, double vision and photophobia.  Respiratory: Negative for cough and shortness of breath.   Cardiovascular: Negative for chest pain, palpitations and leg swelling.  Gastrointestinal: Negative for abdominal pain, nausea and vomiting.  Genitourinary: Negative for flank pain.  Musculoskeletal: Positive for back pain and joint pain. Negative for myalgias.  Skin: Negative for itching and rash.  Neurological: Positive for tingling and weakness. Negative for tremors and focal weakness.  Endo/Heme/Allergies: Negative.   Psychiatric/Behavioral: Negative for depression.  All other systems reviewed and are negative.  Otherwise per HPI.  Assessment & Plan: Visit Diagnoses:  1. Lumbar radiculopathy   2. Spinal stenosis of lumbar region with neurogenic claudication   3. Spondylosis without myelopathy or radiculopathy, lumbar region   4. Chronic bilateral low back pain with bilateral sciatica     Plan: Findings:  Chronic worsening severe and activity limiting low back pain with  bilateral radicular type leg pain that is in somewhat of a hard distribution to figure out but it clearly seems to be lower lumbar radicular type symptoms.  She has moderate stenosis at L2-3 centrally which that in and of itself could do this but she also has more findings of lateral recess and foraminal issues at L4-5 and L5-S1 Duda facet joint hypertrophy and cysts.  She is probably not a great surgical candidate at all at this point.  I think the first approach is simple  epidural injection to see if she gets some relief.  We may have to repeat that x1.  We do not do a series of injections anymore like those were that were done in the past these are more diagnostic and if it does seem to help but we need to do a second 1 we can do that.  If it does not help that much where it helps part of the pain I would look at facet joint blocks intra-articular to see if we could get the facet joint cyst may be 2 strength.  Lastly would consider medication such as duloxetine and/or gabapentin.  Do the patient's travel in severe pain we did complete epidural injection today at L4-5.    Meds & Orders:  Meds ordered this encounter  Medications   methylPREDNISolone acetate (DEPO-MEDROL) injection 80 mg    Orders Placed This Encounter  Procedures   XR C-ARM NO REPORT   Epidural Steroid injection    Follow-up: Return if symptoms worsen or fail to improve.   Procedures: No procedures performed  Lumbar Epidural Steroid Injection - Interlaminar Approach with Fluoroscopic Guidance  Patient: Glenda Nolan      Date of Birth: 1935/08/04 MRN: [id number] PCP: Celene Squibb, MD      Visit Date: 05/23/2019   Universal Protocol:     Consent Given By: the patient  Position: PRONE  Additional Comments: Vital signs were monitored before and after the procedure. Patient was prepped and draped in the usual sterile fashion. The correct patient, procedure, and site was verified.   Injection Procedure Details:  Procedure Site One Meds Administered:  Meds ordered this encounter  Medications   methylPREDNISolone acetate (DEPO-MEDROL) injection 80 mg     Laterality: Right  Location/Site:  L4-L5  Needle size: 20 G  Needle type: Tuohy  Needle Placement: Paramedian epidural  Findings:   -Comments: Excellent flow of contrast into the epidural space.  Procedure Details: Using a paramedian approach from the side mentioned above, the region overlying the inferior lamina  was localized under fluoroscopic visualization and the soft tissues overlying this structure were infiltrated with 4 ml. of 1% Lidocaine without Epinephrine. The Tuohy needle was inserted into the epidural space using a paramedian approach.   The epidural space was localized using loss of resistance along with lateral and bi-planar fluoroscopic views.  After negative aspirate for air, blood, and CSF, a 2 ml. volume of Isovue-250 was injected into the epidural space and the flow of contrast was observed. Radiographs were obtained for documentation purposes.    The injectate was administered into the level noted above.   Additional Comments:  The patient tolerated the procedure well Dressing: 2 x 2 sterile gauze and Band-Aid    Post-procedure details: Patient was observed during the procedure. Post-procedure instructions were reviewed.  Patient left the clinic in stable condition.   Clinical History: MRI LUMBAR SPINE WITHOUT CONTRAST  TECHNIQUE: Multiplanar, multisequence MR imaging of the lumbar spine was performed.  No intravenous contrast was administered.  COMPARISON:  Lumbar spine x-rays dated December 01, 2018.  FINDINGS: Segmentation:  Standard.  Alignment:  Physiologic.  Vertebrae: No fracture, evidence of discitis, or bone lesion. Degenerative endplate marrow edema at T10-T11.  Conus medullaris and cauda equina: Conus extends to the L1-L2 level. Conus and cauda equina appear normal.  Paraspinal and other soft tissues: Negative.  Disc levels:  T10-T11: Chronic severe disc height loss with right eccentric disc bulging. Moderate right neuroforaminal stenosis. No spinal canal or left neuroforaminal stenosis.  T11-T12: Mild disc bulging.  No stenosis.  T12-L1:  Negative.  L1-L2: Mild disc bulging. Mild left facet arthropathy. Borderline mild left lateral recess and neuroforaminal stenosis. No spinal canal or right neuroforaminal stenosis.  L2-L3:  Moderate disc bulging and bilateral facet arthropathy. Moderate spinal canal and lateral recess stenosis. No neuroforaminal stenosis.  L3-L4: Mild disc bulging and moderate bilateral facet arthropathy. Mild spinal canal and left lateral recess stenosis. No neuroforaminal stenosis.  L4-L5: Moderate disc bulging and bilateral facet arthropathy. 6 mm right-sided synovial cyst projecting into the right lateral recess. Mild spinal canal stenosis. Moderate bilateral lateral recess stenosis. No neuroforaminal stenosis.  L5-S1: Small shallow broad-based posterior disc protrusion. Moderate to severe bilateral facet arthropathy. 9 mm left-sided synovial cyst projecting into the left neural foramen. Moderate left neuroforaminal stenosis. No spinal canal or right neuroforaminal stenosis.  IMPRESSION: 1. Multilevel lumbar spondylosis as described above. Moderate stenosis at L2-L3 and L4-L5.   Electronically Signed   By: Titus Dubin M.D.   On: 04/20/2019 16:03   She reports that she quit smoking about 26 years ago. Her smoking use included cigarettes. She has a 5.00 pack-year smoking history. She quit smokeless tobacco use about 26 years ago. No results for input(s): HGBA1C, LABURIC in the last 8760 hours.  Objective:  VS:  HT:     WT:    BMI:      BP:(!) 160/93   HR:93bpm   TEMP:97.6 F (36.4 C)(Oral)   RESP:  Physical Exam Vitals signs and nursing note reviewed.  Constitutional:      General: She is not in acute distress.    Appearance: Normal appearance. She is well-developed. She is obese.  HENT:     Head: Normocephalic and atraumatic.     Nose: Nose normal.     Mouth/Throat:     Mouth: Mucous membranes are moist.     Pharynx: Oropharynx is clear.  Eyes:     Conjunctiva/sclera: Conjunctivae normal.     Pupils: Pupils are equal, round, and reactive to light.  Neck:     Musculoskeletal: Normal range of motion and neck supple.  Cardiovascular:     Rate and Rhythm:  Regular rhythm.  Pulmonary:     Effort: Pulmonary effort is normal. No respiratory distress.  Abdominal:     General: There is no distension.     Palpations: Abdomen is soft.     Tenderness: There is no guarding.  Musculoskeletal:     Right lower leg: No edema.     Left lower leg: No edema.     Comments: Patient slow to rise from a seated position and does have pain with extension and facet loading.  She does ambulate with a forward flexed lumbar spine.  No pain with hip rotation good distal strength without clonus.  No real tenderness across the lower back although somewhat tender over the greater trochanters to a degree but not specific.  Skin:    General: Skin  is warm and dry.     Findings: No erythema or rash.  Neurological:     General: No focal deficit present.     Mental Status: She is alert and oriented to person, place, and time.     Motor: No abnormal muscle tone.     Coordination: Coordination normal.     Gait: Gait abnormal.  Psychiatric:        Mood and Affect: Mood normal.        Behavior: Behavior normal.        Thought Content: Thought content normal.     Ortho Exam Imaging: Xr C-arm No Report  Result Date: 05/23/2019 Please see Notes tab for imaging impression.   Past Medical/Family/Surgical/Social History: Medications & Allergies reviewed per EMR, new medications updated. Patient Active Problem List   Diagnosis Date Noted   Spinal stenosis of lumbar region with neurogenic claudication 05/24/2019   Spondylosis without myelopathy or radiculopathy, lumbar region 05/24/2019   S/P TKR (total knee replacement), left 03/18/2017 03/18/2017   Heme positive stool 03/22/2012   Melena 03/22/2012   GERD (gastroesophageal reflux disease) 03/22/2012   CARCINOMA, COLON 04/26/2009   MIGRAINE HEADACHE 04/26/2009   HYPERTENSION 04/26/2009   DEGENERATIVE JOINT DISEASE 04/26/2009   IRON DEFICIENCY ANEMIA, HX OF 04/26/2009   KNEE, ARTHRITIS, DEGEN./OSTEO  03/01/2009   KNEE PAIN 03/01/2009   Past Medical History:  Diagnosis Date   Anemia    Arthritis    Cataracts, bilateral    pending surgery May 2013   Colon cancer ([place]) 1994   Stage IIB   Diverticulosis    GERD (gastroesophageal reflux disease)    HTN (hypertension)    Hyperlipidemia    Hypothyroidism    Migraines    occasional migraines   Osteoporosis    Family History  Problem Relation Age of Onset   Leukemia Mother 43   Hypertension Mother    Heart failure Father 4   Coronary artery disease Sister        x2   Stroke Son    Past Surgical History:  Procedure Laterality Date   CATARACT EXTRACTION W/PHACO Left 08/01/2013   Procedure: CATARACT EXTRACTION PHACO AND INTRAOCULAR LENS PLACEMENT ([place]);  Surgeon: Tonny Branch, MD;  Location: [facility];  Service: Ophthalmology;  Laterality: Left;  CDE:11.47   CATARACT EXTRACTION W/PHACO Right 08/15/2013   Procedure: CATARACT EXTRACTION PHACO AND INTRAOCULAR LENS PLACEMENT ([facility]);  Surgeon: Tonny Branch, MD;  Location: [facility];  Service: Ophthalmology;  Laterality: Right;  CDE:  16.81   COLON SURGERY  1994   Right   COLONOSCOPY  04/01/2012   Rourk-Colonic ulcers at the anastomosis likely NSAID related, although ischemia is not excluded, status post biopsy, left-sided diverticula.  Internal hemorrhoids and anal papilla   COLONOSCOPY W/ BIOPSIES  05/04/09   Dr. Madolyn Frieze papilla, diverticulosis   DILATION AND CURETTAGE OF UTERUS     ESOPHAGOGASTRODUODENOSCOPY  04/01/2012   Rourk-Normal esophagus, small hiatal hernia, deformity, scarring friability of the antrum/prepyloric mucosa suggestive of prior peptic ulcer disease, normal D1 and D2.   HEMORRHOID SURGERY     knee arthrocopy(Right)     TOTAL KNEE ARTHROPLASTY Left 03/18/2017   Procedure: TOTAL KNEE ARTHROPLASTY;  Surgeon: Carole Civil, MD;  Location: [facility];  Service: Orthopedics;  Laterality: Left;   Social History   Occupational History    Occupation: retired,[employer]: retired  Tobacco Use   Smoking status: Former Smoker    Packs/day: 0.50    Years:  10.00    Pack years: 5.00    Types: Cigarettes    Quit date: 11/24/1992    Years since quitting: 26.5   Smokeless tobacco: Former Systems developer    Quit date: 12/22/1992  Substance and Sexual Activity   Alcohol use: Yes    Comment: a glass of wine once a year   Drug use: No   Sexual activity: Yes    Birth control/protection: Post-menopausal

## 2019-05-24 NOTE — Procedures (Signed)
Lumbar Epidural Steroid Injection - Interlaminar Approach with Fluoroscopic Guidance  Patient: Glenda Nolan      Date of Birth: 09/28/1935 MRN: [id number] PCP: Celene Squibb, MD      Visit Date: 05/23/2019   Universal Protocol:     Consent Given By: the patient  Position: PRONE  Additional Comments: Vital signs were monitored before and after the procedure. Patient was prepped and draped in the usual sterile fashion. The correct patient, procedure, and site was verified.   Injection Procedure Details:  Procedure Site One Meds Administered:  Meds ordered this encounter  Medications  . methylPREDNISolone acetate (DEPO-MEDROL) injection 80 mg     Laterality: Right  Location/Site:  L4-L5  Needle size: 20 G  Needle type: Tuohy  Needle Placement: Paramedian epidural  Findings:   -Comments: Excellent flow of contrast into the epidural space.  Procedure Details: Using a paramedian approach from the side mentioned above, the region overlying the inferior lamina was localized under fluoroscopic visualization and the soft tissues overlying this structure were infiltrated with 4 ml. of 1% Lidocaine without Epinephrine. The Tuohy needle was inserted into the epidural space using a paramedian approach.   The epidural space was localized using loss of resistance along with lateral and bi-planar fluoroscopic views.  After negative aspirate for air, blood, and CSF, a 2 ml. volume of Isovue-250 was injected into the epidural space and the flow of contrast was observed. Radiographs were obtained for documentation purposes.    The injectate was administered into the level noted above.   Additional Comments:  The patient tolerated the procedure well Dressing: 2 x 2 sterile gauze and Band-Aid    Post-procedure details: Patient was observed during the procedure. Post-procedure instructions were reviewed.  Patient left the clinic in stable condition.

## 2019-06-08 DIAGNOSIS — E782 Mixed hyperlipidemia: Secondary | ICD-10-CM | POA: Diagnosis not present

## 2019-06-08 DIAGNOSIS — R7301 Impaired fasting glucose: Secondary | ICD-10-CM | POA: Diagnosis not present

## 2019-06-08 DIAGNOSIS — I10 Essential (primary) hypertension: Secondary | ICD-10-CM | POA: Diagnosis not present

## 2019-06-08 DIAGNOSIS — E039 Hypothyroidism, unspecified: Secondary | ICD-10-CM | POA: Diagnosis not present

## 2019-06-13 ENCOUNTER — Other Ambulatory Visit: Payer: Self-pay

## 2019-06-13 ENCOUNTER — Ambulatory Visit (INDEPENDENT_AMBULATORY_CARE_PROVIDER_SITE_OTHER): Payer: Medicare Other | Admitting: Orthopedic Surgery

## 2019-06-13 ENCOUNTER — Telehealth: Payer: Self-pay | Admitting: Physical Medicine and Rehabilitation

## 2019-06-13 ENCOUNTER — Encounter: Payer: Self-pay | Admitting: Orthopedic Surgery

## 2019-06-13 VITALS — BP 151/107 | HR 87 | Ht 64.0 in | Wt 157.0 lb

## 2019-06-13 DIAGNOSIS — M48062 Spinal stenosis, lumbar region with neurogenic claudication: Secondary | ICD-10-CM

## 2019-06-13 NOTE — Telephone Encounter (Signed)
Do not need referral if last helped etc, if not or questions let me know

## 2019-06-13 NOTE — Progress Notes (Signed)
Progress Note   Patient ID: Glenda Nolan, female   DOB: 01/01/1935, 84 y.o.   MRN: [id number]   Chief Complaint  Patient presents with   Knee Pain    has pain behind left knee     Encounter Diagnosis  Name Primary?   Spinal stenosis of lumbar region with neurogenic claudication Yes    No Known Allergies   Current Outpatient Medications:    acetaminophen (TYLENOL) 325 MG tablet, Take 325 mg by mouth daily after lunch. , Disp: , Rfl:    alendronate (FOSAMAX) 70 MG tablet, Take 70 mg by mouth every Monday. , Disp: , Rfl:    amLODipine (NORVASC) 10 MG tablet, , Disp: , Rfl:    Artificial Tear Solution (SOOTHE XP) SOLN, Place 1 drop into both eyes 2 (two) times daily as needed (for dry eyes)., Disp: , Rfl:    aspirin EC 325 MG EC tablet, Take 1 tablet (325 mg total) by mouth daily with breakfast., Disp: 30 tablet, Rfl: 0   docusate sodium (COLACE) 100 MG capsule, Take 1 capsule (100 mg total) by mouth 2 (two) times daily., Disp: 10 capsule, Rfl: 0   furosemide (LASIX) 20 MG tablet, , Disp: , Rfl:    IRON PO, Take 1 tablet by mouth daily after lunch. , Disp: , Rfl:    levothyroxine (SYNTHROID, LEVOTHROID) 50 MCG tablet, Take 50 mcg by mouth daily after breakfast. (0800), Disp: , Rfl:    metoprolol tartrate (LOPRESSOR) 50 MG tablet, , Disp: , Rfl:    Multiple Vitamins-Minerals (PX SENIOR VITAMIN PO), Take 1 tablet by mouth daily after lunch. , Disp: , Rfl:    omeprazole (PRILOSEC) 20 MG capsule, Take 20 mg by mouth daily after breakfast. (0800), Disp: , Rfl:    potassium chloride SA (K-DUR,KLOR-CON) 20 MEQ tablet, Take 1 tablet (20 mEq total) by mouth 2 (two) times daily., Disp: 60 tablet, Rfl: 0   raloxifene (EVISTA) 60 MG tablet, Take 60 mg by mouth daily after breakfast. (0800), Disp: , Rfl:    simvastatin (ZOCOR) 40 MG tablet, Take 40 mg by mouth at bedtime. , Disp: , Rfl:    telmisartan (MICARDIS) 80 MG tablet, , Disp: , Rfl:    cholecalciferol (VITAMIN D) 1000  UNITS tablet, Take 1,000 Units by mouth daily after lunch. , Disp: , Rfl:    EDARBI 80 MG TABS, Take 80 mg by mouth daily after breakfast. (0800), Disp: , Rfl:    potassium chloride (K-DUR) 10 MEQ tablet, , Disp: , Rfl:    verapamil (CALAN-SR) 240 MG CR tablet, Take 240 mg by mouth daily after breakfast. (0800), Disp: , Rfl:    vitamin E (VITAMIN E) 400 UNIT capsule, Take 400 Units by mouth daily after lunch. , Disp: , Rfl:    Glenda Nolan is undergoing treatments for lumbar disc disease and receiving injections x1 for radicular leg pain on the left.  She comes in with continued pain behind the knee and radiating down to the left ankle and some weakness in the left lower extremity    ROS  No abnormal GI or urinary problems at this time  BP (!) 151/107    Pulse 87    Ht 5\' 4"  (1.626 m)    Wt 157 lb (71.2 kg)    BMI 26.95 kg/m   Physical Exam Vitals signs and nursing note reviewed.  Constitutional:      Appearance: Normal appearance.  Musculoskeletal:       Legs:  Skin:  General: Skin is warm and dry.     Capillary Refill: Capillary refill takes less than 2 seconds.  Neurological:     Mental Status: She is alert and oriented to person, place, and time.     Sensory: No sensory deficit.     Motor: Weakness present. No tremor, atrophy or abnormal muscle tone.     Gait: Gait abnormal.  Psychiatric:        Mood and Affect: Mood normal.        Thought Content: Thought content normal.      Medical decisions:  (Established problem worse, x-ray ,physical therapy, over-the-counter medicines, read outside film or summarize x-ray)  Data  Imaging:   No new imaging her MRI is in the record and shows lumbar disc disease spinal stenosis  Encounter Diagnosis  Name Primary?   Spinal stenosis of lumbar region with neurogenic claudication Yes    PLAN:   Repeat injection lumbar spine follow-up in 2 months    Arther Abbott, MD 06/13/2019 9:07 AM

## 2019-06-14 NOTE — Telephone Encounter (Signed)
Scheduled for 8/6 at 1000 with driver and no blood thinners. Per recent referral, no auth required.

## 2019-06-15 DIAGNOSIS — I10 Essential (primary) hypertension: Secondary | ICD-10-CM | POA: Diagnosis not present

## 2019-06-15 DIAGNOSIS — E1169 Type 2 diabetes mellitus with other specified complication: Secondary | ICD-10-CM | POA: Diagnosis not present

## 2019-06-15 DIAGNOSIS — Z0000 Encounter for general adult medical examination without abnormal findings: Secondary | ICD-10-CM | POA: Diagnosis not present

## 2019-06-15 DIAGNOSIS — M810 Age-related osteoporosis without current pathological fracture: Secondary | ICD-10-CM | POA: Diagnosis not present

## 2019-06-15 DIAGNOSIS — E782 Mixed hyperlipidemia: Secondary | ICD-10-CM | POA: Diagnosis not present

## 2019-06-30 ENCOUNTER — Encounter: Payer: Self-pay | Admitting: Physical Medicine and Rehabilitation

## 2019-06-30 ENCOUNTER — Ambulatory Visit (INDEPENDENT_AMBULATORY_CARE_PROVIDER_SITE_OTHER): Payer: Medicare Other | Admitting: Physical Medicine and Rehabilitation

## 2019-06-30 ENCOUNTER — Ambulatory Visit: Payer: Self-pay

## 2019-06-30 VITALS — BP 158/100 | HR 95

## 2019-06-30 DIAGNOSIS — M48062 Spinal stenosis, lumbar region with neurogenic claudication: Secondary | ICD-10-CM

## 2019-06-30 DIAGNOSIS — M47816 Spondylosis without myelopathy or radiculopathy, lumbar region: Secondary | ICD-10-CM

## 2019-06-30 DIAGNOSIS — M5416 Radiculopathy, lumbar region: Secondary | ICD-10-CM | POA: Diagnosis not present

## 2019-06-30 MED ORDER — METHYLPREDNISOLONE ACETATE 80 MG/ML IJ SUSP
80.0000 mg | Freq: Once | INTRAMUSCULAR | Status: AC
  Administered 2019-06-30: 80 mg

## 2019-06-30 NOTE — Progress Notes (Signed)
[name] - 84 y.o. female MRN [id number]  Date of birth: 12-01-1934  Office Visit Note: Visit Date: 06/30/2019 PCP: Celene Squibb, MD Referred by: Celene Squibb, MD  Subjective: No chief complaint on file.  HPI:  Glenda Nolan is a 84 y.o. female who comes in today At the request of Dr. Arther Abbott for repeat epidural injection at L4-5.  We completed this injection in June with some relief of symptoms but symptoms were still present and continued.  She followed up with Dr. Aline Brochure who she sees from orthopedic surgery standpoint.  She has had prior total knee arthroplasty.  She is a little bit confused today and somewhat perseverative.  We will see her enough to know her baseline.  She is oriented.  She does talk about pain in the low back mainly in the low back some on either side of the legs bilaterally but no real paresthesia.  MRI reviewed again shows multilevel facet arthropathy with small facet joint cyst and some narrowing of lateral recesses but no high-grade central stenosis.  I think the best approach is to repeat the injection as requested and have her come back in a few weeks for possible facet joint block at that time we will try to get her daughter to come in with this for good follow-up and communication.  ROS Otherwise per HPI.  Assessment & Plan: Visit Diagnoses:  1. Lumbar radiculopathy   2. Spinal stenosis of lumbar region with neurogenic claudication   3. Spondylosis without myelopathy or radiculopathy, lumbar region     Plan: No additional findings.   Meds & Orders:  Meds ordered this encounter  Medications  . methylPREDNISolone acetate (DEPO-MEDROL) injection 80 mg    Orders Placed This Encounter  Procedures  . XR C-ARM NO REPORT  . Epidural Steroid injection    Follow-up: Return in about 3 weeks (around 07/21/2019) for possible facet block and have daughter present.   Procedures: No procedures performed  Lumbar Epidural Steroid Injection -  Interlaminar Approach with Fluoroscopic Guidance  Patient: Glenda Nolan      Date of Birth: [birth year] MRN: [id number] PCP: Celene Squibb, MD      Visit Date: 06/30/2019   Universal Protocol:     Consent Given By: the patient  Position: PRONE  Additional Comments: Vital signs were monitored before and after the procedure. Patient was prepped and draped in the usual sterile fashion. The correct patient, procedure, and site was verified.   Injection Procedure Details:  Procedure Site One Meds Administered:  Meds ordered this encounter  Medications  . methylPREDNISolone acetate (DEPO-MEDROL) injection 80 mg     Laterality: Right  Location/Site:  L5-S1  Needle size: 20 G  Needle type: Tuohy  Needle Placement: Paramedian epidural  Findings:   -Comments: Excellent flow of contrast into the epidural space.  Procedure Details: Using a paramedian approach from the side mentioned above, the region overlying the inferior lamina was localized under fluoroscopic visualization and the soft tissues overlying this structure were infiltrated with 4 ml. of 1% Lidocaine without Epinephrine. The Tuohy needle was inserted into the epidural space using a paramedian approach.   The epidural space was localized using loss of resistance along with lateral and bi-planar fluoroscopic views.  After negative aspirate for air, blood, and CSF, a 2 ml. volume of Isovue-250 was injected into the epidural space and the flow of contrast was observed. Radiographs were obtained for documentation purposes.    The  injectate was administered into the level noted above.   Additional Comments:  The patient tolerated the procedure well Dressing: 2 x 2 sterile gauze and Band-Aid    Post-procedure details: Patient was observed during the procedure. Post-procedure instructions were reviewed.  Patient left the clinic in stable condition.   Clinical History: MRI LUMBAR SPINE WITHOUT CONTRAST   TECHNIQUE: Multiplanar, multisequence MR imaging of the lumbar spine was performed. No intravenous contrast was administered.  COMPARISON:  Lumbar spine x-rays dated December 01, 2018.  FINDINGS: Segmentation:  Standard.  Alignment:  Physiologic.  Vertebrae: No fracture, evidence of discitis, or bone lesion. Degenerative endplate marrow edema at T10-T11.  Conus medullaris and cauda equina: Conus extends to the L1-L2 level. Conus and cauda equina appear normal.  Paraspinal and other soft tissues: Negative.  Disc levels:  T10-T11: Chronic severe disc height loss with right eccentric disc bulging. Moderate right neuroforaminal stenosis. No spinal canal or left neuroforaminal stenosis.  T11-T12: Mild disc bulging.  No stenosis.  T12-L1:  Negative.  L1-L2: Mild disc bulging. Mild left facet arthropathy. Borderline mild left lateral recess and neuroforaminal stenosis. No spinal canal or right neuroforaminal stenosis.  L2-L3: Moderate disc bulging and bilateral facet arthropathy. Moderate spinal canal and lateral recess stenosis. No neuroforaminal stenosis.  L3-L4: Mild disc bulging and moderate bilateral facet arthropathy. Mild spinal canal and left lateral recess stenosis. No neuroforaminal stenosis.  L4-L5: Moderate disc bulging and bilateral facet arthropathy. 6 mm right-sided synovial cyst projecting into the right lateral recess. Mild spinal canal stenosis. Moderate bilateral lateral recess stenosis. No neuroforaminal stenosis.  L5-S1: Small shallow broad-based posterior disc protrusion. Moderate to severe bilateral facet arthropathy. 9 mm left-sided synovial cyst projecting into the left neural foramen. Moderate left neuroforaminal stenosis. No spinal canal or right neuroforaminal stenosis.  IMPRESSION: 1. Multilevel lumbar spondylosis as described above. Moderate stenosis at L2-L3 and L4-L5.   Electronically Signed   By: Titus Dubin M.D.    On: 04/20/2019 16:03     Objective:  VS:  HT:    WT:   BMI:     BP:(!) 158/100  HR:95bpm  TEMP: ( )  RESP:  Physical Exam Musculoskeletal:     Comments: Patient ambulates without aid.  She has no pain over the greater trochanters and good distal strength.  Neurological:     Mental Status: She is alert and oriented to person, place, and time.     Motor: No abnormal muscle tone.     Coordination: Coordination normal.     Ortho Exam Imaging: Xr C-arm No Report  Result Date: 06/30/2019 Please see Notes tab for imaging impression.

## 2019-06-30 NOTE — Procedures (Signed)
Lumbar Epidural Steroid Injection - Interlaminar Approach with Fluoroscopic Guidance  Patient: Glenda Nolan      Date of Birth: 10/12/1935 MRN: [id number] PCP: Celene Squibb, MD      Visit Date: 06/30/2019   Universal Protocol:     Consent Given By: the patient  Position: PRONE  Additional Comments: Vital signs were monitored before and after the procedure. Patient was prepped and draped in the usual sterile fashion. The correct patient, procedure, and site was verified.   Injection Procedure Details:  Procedure Site One Meds Administered:  Meds ordered this encounter  Medications  . methylPREDNISolone acetate (DEPO-MEDROL) injection 80 mg     Laterality: Right  Location/Site:  L5-S1  Needle size: 20 G  Needle type: Tuohy  Needle Placement: Paramedian epidural  Findings:   -Comments: Excellent flow of contrast into the epidural space.  Procedure Details: Using a paramedian approach from the side mentioned above, the region overlying the inferior lamina was localized under fluoroscopic visualization and the soft tissues overlying this structure were infiltrated with 4 ml. of 1% Lidocaine without Epinephrine. The Tuohy needle was inserted into the epidural space using a paramedian approach.   The epidural space was localized using loss of resistance along with lateral and bi-planar fluoroscopic views.  After negative aspirate for air, blood, and CSF, a 2 ml. volume of Isovue-250 was injected into the epidural space and the flow of contrast was observed. Radiographs were obtained for documentation purposes.    The injectate was administered into the level noted above.   Additional Comments:  The patient tolerated the procedure well Dressing: 2 x 2 sterile gauze and Band-Aid    Post-procedure details: Patient was observed during the procedure. Post-procedure instructions were reviewed.  Patient left the clinic in stable condition.

## 2019-06-30 NOTE — Progress Notes (Signed)
 .  Numeric Pain Rating Scale and Functional Assessment Average Pain 7   In the last MONTH (on 0-10 scale) has pain interfered with the following?  1. General activity like being  able to carry out your everyday physical activities such as walking, climbing stairs, carrying groceries, or moving a chair?  Rating(5)   +Driver, -BT, -Dye Allergies.  

## 2019-07-27 DIAGNOSIS — E039 Hypothyroidism, unspecified: Secondary | ICD-10-CM | POA: Diagnosis not present

## 2019-08-03 DIAGNOSIS — I10 Essential (primary) hypertension: Secondary | ICD-10-CM | POA: Diagnosis not present

## 2019-08-03 DIAGNOSIS — E782 Mixed hyperlipidemia: Secondary | ICD-10-CM | POA: Diagnosis not present

## 2019-08-03 DIAGNOSIS — E039 Hypothyroidism, unspecified: Secondary | ICD-10-CM | POA: Diagnosis not present

## 2019-08-03 DIAGNOSIS — R7301 Impaired fasting glucose: Secondary | ICD-10-CM | POA: Diagnosis not present

## 2019-08-15 ENCOUNTER — Ambulatory Visit (INDEPENDENT_AMBULATORY_CARE_PROVIDER_SITE_OTHER): Payer: Medicare Other | Admitting: Orthopedic Surgery

## 2019-08-15 ENCOUNTER — Other Ambulatory Visit: Payer: Self-pay

## 2019-08-15 VITALS — BP 151/83 | HR 63 | Temp 97.0°F | Ht 64.0 in | Wt 151.8 lb

## 2019-08-15 DIAGNOSIS — M48062 Spinal stenosis, lumbar region with neurogenic claudication: Secondary | ICD-10-CM

## 2019-08-15 NOTE — Patient Instructions (Addendum)
Repeat esi   If no improvement call to make a new appt

## 2019-08-15 NOTE — Progress Notes (Signed)
Progress Note   Patient ID: Glenda Nolan, female   DOB: 05-Sep-1935, 84 y.o.   MRN: [id number]   Chief Complaint  Patient presents with  . Knee Pain    Still having pain behind her left knee    Encounter Diagnosis  Name Primary?  . Spinal stenosis of lumbar region with neurogenic claudication Yes    84 BACK PAIN SPINAL STENOSIS; 2 ESI GOOD RESULT BUT PAIN RETURNED AFTER BENDING FORWARD  Now complains of back pain and left buttock pain again    Review of Systems  Musculoskeletal: Positive for back pain.  Neurological: Negative for focal weakness.      BP (!) 151/83   Pulse 63   Temp (!) 97 F (36.1 C)   Ht 5\' 4"  (1.626 m)   Wt 151 lb 12.8 oz (68.9 kg)   BMI 26.06 kg/m   Physical Exam Vitals signs and nursing note reviewed.  Constitutional:      Appearance: Normal appearance.  Musculoskeletal:       Back:     Comments: Strength left leg normal  No neurologic deficit procedure detected  Patient ambulates with a cane    Neurological:     Mental Status: She is alert and oriented to person, place, and time.  Psychiatric:        Mood and Affect: Mood normal.      Medical decisions:  (Established problem worse, x-ray ,physical therapy, over-the-counter medicines, read outside film or summarize x-ray)  Data  Imaging:   No new imaging  Encounter Diagnosis  Name Primary?  . Spinal stenosis of lumbar region with neurogenic claudication Yes    PLAN:  Repeat ESI's    Arther Abbott, MD 08/15/2019 11:02 AM

## 2019-08-29 DIAGNOSIS — I10 Essential (primary) hypertension: Secondary | ICD-10-CM | POA: Diagnosis not present

## 2019-08-29 DIAGNOSIS — R7301 Impaired fasting glucose: Secondary | ICD-10-CM | POA: Diagnosis not present

## 2019-08-29 DIAGNOSIS — E782 Mixed hyperlipidemia: Secondary | ICD-10-CM | POA: Diagnosis not present

## 2019-08-29 DIAGNOSIS — E039 Hypothyroidism, unspecified: Secondary | ICD-10-CM | POA: Diagnosis not present

## 2019-08-31 ENCOUNTER — Encounter: Payer: Self-pay | Admitting: Physical Medicine and Rehabilitation

## 2019-08-31 ENCOUNTER — Ambulatory Visit: Payer: Self-pay

## 2019-08-31 ENCOUNTER — Ambulatory Visit (INDEPENDENT_AMBULATORY_CARE_PROVIDER_SITE_OTHER): Payer: Medicare Other | Admitting: Physical Medicine and Rehabilitation

## 2019-08-31 VITALS — BP 148/87 | HR 75

## 2019-08-31 DIAGNOSIS — M5416 Radiculopathy, lumbar region: Secondary | ICD-10-CM

## 2019-08-31 DIAGNOSIS — M48062 Spinal stenosis, lumbar region with neurogenic claudication: Secondary | ICD-10-CM | POA: Diagnosis not present

## 2019-08-31 MED ORDER — METHYLPREDNISOLONE ACETATE 80 MG/ML IJ SUSP
80.0000 mg | Freq: Once | INTRAMUSCULAR | Status: AC
  Administered 2019-08-31: 15:00:00 80 mg

## 2019-08-31 NOTE — Progress Notes (Signed)
 .  Numeric Pain Rating Scale and Functional Assessment Average Pain 8   In the last MONTH (on 0-10 scale) has pain interfered with the following?  1. General activity like being  able to carry out your everyday physical activities such as walking, climbing stairs, carrying groceries, or moving a chair?  Rating(8)   +Driver, -BT, -Dye Allergies.  

## 2019-08-31 NOTE — Progress Notes (Signed)
Glenda Nolan - 84 y.o. female MRN [id number]  Date of birth: 1935-02-11  Office Visit Note: Visit Date: 08/31/2019 PCP: Celene Squibb, MD Referred by: Celene Squibb, MD  Subjective: Chief Complaint  Patient presents with  . Lower Back - Pain  . Left Foot - Pain, Tingling   HPI:  Glenda Nolan is a 84 y.o. female who comes in today At the request of Arther Abbott, MD for repeat epidural injection.  Patient had right-sided L5-S1 interlaminar epidural steroid injection in August and did quite well.  She is actually having more left-sided symptoms now than bilateral.  I think the right-sided injection seem to help quite a bit.  Unfortunately she has a bad issue where she has lateral recess narrowing at L4-5 fairly severe and then she has facet joint cyst on the left L5-S1 facet joint impacting the foramen.  Depending on relief today which we would use an L5-S1 interlaminar injection again on the left I would look at either transforaminal approach or facet joint block.  Unfortunately she has a pretty bad issue here.  It may be something that requires decompression or maybe unfortunately for her she may want to inquire about chronic pain management.  ROS Otherwise per HPI.  Assessment & Plan: Visit Diagnoses:  1. Lumbar radiculopathy   2. Spinal stenosis of lumbar region with neurogenic claudication     Plan: No additional findings.   Meds & Orders:  Meds ordered this encounter  Medications  . methylPREDNISolone acetate (DEPO-MEDROL) injection 80 mg    Orders Placed This Encounter  Procedures  . XR C-ARM NO REPORT  . Epidural Steroid injection    Follow-up: Return if symptoms worsen or fail to improve.   Procedures: No procedures performed  Lumbar Epidural Steroid Injection - Interlaminar Approach with Fluoroscopic Guidance  Patient: Glenda Nolan      Date of Birth: [birth year] MRN: [id number] PCP: Celene Squibb, MD      Visit Date: 08/31/2019   Universal Protocol:      Consent Given By: the patient  Position: PRONE  Additional Comments: Vital signs were monitored before and after the procedure. Patient was prepped and draped in the usual sterile fashion. The correct patient, procedure, and site was verified.   Injection Procedure Details:  Procedure Site One Meds Administered:  Meds ordered this encounter  Medications  . methylPREDNISolone acetate (DEPO-MEDROL) injection 80 mg     Laterality: Left  Location/Site:  L5-S1  Needle size: 20 G  Needle type: Tuohy  Needle Placement: Paramedian epidural  Findings:   -Comments: Excellent flow of contrast into the epidural space.  Procedure Details: Using a paramedian approach from the side mentioned above, the region overlying the inferior lamina was localized under fluoroscopic visualization and the soft tissues overlying this structure were infiltrated with 4 ml. of 1% Lidocaine without Epinephrine. The Tuohy needle was inserted into the epidural space using a paramedian approach.   The epidural space was localized using loss of resistance along with lateral and bi-planar fluoroscopic views.  After negative aspirate for air, blood, and CSF, a 2 ml. volume of Isovue-250 was injected into the epidural space and the flow of contrast was observed. Radiographs were obtained for documentation purposes.    The injectate was administered into the level noted above.   Additional Comments:  The patient tolerated the procedure well Dressing: 2 x 2 sterile gauze and Band-Aid    Post-procedure details: Patient was observed during the procedure.  Post-procedure instructions were reviewed.  Patient left the clinic in stable condition.   Clinical History: MRI LUMBAR SPINE WITHOUT CONTRAST  TECHNIQUE: Multiplanar, multisequence MR imaging of the lumbar spine was performed. No intravenous contrast was administered.  COMPARISON:  Lumbar spine x-rays dated December 01, 2018.  FINDINGS:  Segmentation:  Standard.  Alignment:  Physiologic.  Vertebrae: No fracture, evidence of discitis, or bone lesion. Degenerative endplate marrow edema at T10-T11.  Conus medullaris and cauda equina: Conus extends to the L1-L2 level. Conus and cauda equina appear normal.  Paraspinal and other soft tissues: Negative.  Disc levels:  T10-T11: Chronic severe disc height loss with right eccentric disc bulging. Moderate right neuroforaminal stenosis. No spinal canal or left neuroforaminal stenosis.  T11-T12: Mild disc bulging.  No stenosis.  T12-L1:  Negative.  L1-L2: Mild disc bulging. Mild left facet arthropathy. Borderline mild left lateral recess and neuroforaminal stenosis. No spinal canal or right neuroforaminal stenosis.  L2-L3: Moderate disc bulging and bilateral facet arthropathy. Moderate spinal canal and lateral recess stenosis. No neuroforaminal stenosis.  L3-L4: Mild disc bulging and moderate bilateral facet arthropathy. Mild spinal canal and left lateral recess stenosis. No neuroforaminal stenosis.  L4-L5: Moderate disc bulging and bilateral facet arthropathy. 6 mm right-sided synovial cyst projecting into the right lateral recess. Mild spinal canal stenosis. Moderate bilateral lateral recess stenosis. No neuroforaminal stenosis.  L5-S1: Small shallow broad-based posterior disc protrusion. Moderate to severe bilateral facet arthropathy. 9 mm left-sided synovial cyst projecting into the left neural foramen. Moderate left neuroforaminal stenosis. No spinal canal or right neuroforaminal stenosis.  IMPRESSION: 1. Multilevel lumbar spondylosis as described above. Moderate stenosis at L2-L3 and L4-L5.   Electronically Signed   By: Titus Dubin M.D.   On: 04/20/2019 16:03     Objective:  VS:  HT:    WT:   BMI:     BP:(!) 148/87  HR:75bpm  TEMP: ( )  RESP:  Physical Exam  Ortho Exam Imaging: No results found.

## 2019-09-29 DIAGNOSIS — E782 Mixed hyperlipidemia: Secondary | ICD-10-CM | POA: Diagnosis not present

## 2019-09-29 DIAGNOSIS — I10 Essential (primary) hypertension: Secondary | ICD-10-CM | POA: Diagnosis not present

## 2019-10-06 NOTE — Procedures (Signed)
Lumbar Epidural Steroid Injection - Interlaminar Approach with Fluoroscopic Guidance  Patient: Glenda Nolan      Date of Birth: May 22, 1935 MRN: [id number] PCP: Celene Squibb, MD      Visit Date: 08/31/2019   Universal Protocol:     Consent Given By: the patient  Position: PRONE  Additional Comments: Vital signs were monitored before and after the procedure. Patient was prepped and draped in the usual sterile fashion. The correct patient, procedure, and site was verified.   Injection Procedure Details:  Procedure Site One Meds Administered:  Meds ordered this encounter  Medications  . methylPREDNISolone acetate (DEPO-MEDROL) injection 80 mg     Laterality: Left  Location/Site:  L5-S1  Needle size: 20 G  Needle type: Tuohy  Needle Placement: Paramedian epidural  Findings:   -Comments: Excellent flow of contrast into the epidural space.  Procedure Details: Using a paramedian approach from the side mentioned above, the region overlying the inferior lamina was localized under fluoroscopic visualization and the soft tissues overlying this structure were infiltrated with 4 ml. of 1% Lidocaine without Epinephrine. The Tuohy needle was inserted into the epidural space using a paramedian approach.   The epidural space was localized using loss of resistance along with lateral and bi-planar fluoroscopic views.  After negative aspirate for air, blood, and CSF, a 2 ml. volume of Isovue-250 was injected into the epidural space and the flow of contrast was observed. Radiographs were obtained for documentation purposes.    The injectate was administered into the level noted above.   Additional Comments:  The patient tolerated the procedure well Dressing: 2 x 2 sterile gauze and Band-Aid    Post-procedure details: Patient was observed during the procedure. Post-procedure instructions were reviewed.  Patient left the clinic in stable condition.

## 2019-11-21 DIAGNOSIS — I10 Essential (primary) hypertension: Secondary | ICD-10-CM | POA: Diagnosis not present

## 2019-11-21 DIAGNOSIS — E7849 Other hyperlipidemia: Secondary | ICD-10-CM | POA: Diagnosis not present

## 2019-11-28 DIAGNOSIS — E7849 Other hyperlipidemia: Secondary | ICD-10-CM | POA: Diagnosis not present

## 2019-11-28 DIAGNOSIS — I10 Essential (primary) hypertension: Secondary | ICD-10-CM | POA: Diagnosis not present

## 2019-12-09 DIAGNOSIS — I10 Essential (primary) hypertension: Secondary | ICD-10-CM | POA: Diagnosis not present

## 2019-12-09 DIAGNOSIS — R7301 Impaired fasting glucose: Secondary | ICD-10-CM | POA: Diagnosis not present

## 2019-12-09 DIAGNOSIS — E039 Hypothyroidism, unspecified: Secondary | ICD-10-CM | POA: Diagnosis not present

## 2019-12-09 DIAGNOSIS — E1169 Type 2 diabetes mellitus with other specified complication: Secondary | ICD-10-CM | POA: Diagnosis not present

## 2019-12-09 DIAGNOSIS — E782 Mixed hyperlipidemia: Secondary | ICD-10-CM | POA: Diagnosis not present

## 2019-12-14 DIAGNOSIS — E782 Mixed hyperlipidemia: Secondary | ICD-10-CM | POA: Diagnosis not present

## 2019-12-14 DIAGNOSIS — E039 Hypothyroidism, unspecified: Secondary | ICD-10-CM | POA: Diagnosis not present

## 2019-12-14 DIAGNOSIS — M545 Low back pain: Secondary | ICD-10-CM | POA: Diagnosis not present

## 2019-12-14 DIAGNOSIS — I10 Essential (primary) hypertension: Secondary | ICD-10-CM | POA: Diagnosis not present

## 2019-12-14 DIAGNOSIS — M810 Age-related osteoporosis without current pathological fracture: Secondary | ICD-10-CM | POA: Diagnosis not present

## 2020-01-06 DIAGNOSIS — E7849 Other hyperlipidemia: Secondary | ICD-10-CM | POA: Diagnosis not present

## 2020-01-06 DIAGNOSIS — I10 Essential (primary) hypertension: Secondary | ICD-10-CM | POA: Diagnosis not present

## 2020-01-25 ENCOUNTER — Encounter: Payer: Self-pay | Admitting: Cardiology

## 2020-01-25 ENCOUNTER — Other Ambulatory Visit: Payer: Self-pay

## 2020-01-25 ENCOUNTER — Ambulatory Visit (INDEPENDENT_AMBULATORY_CARE_PROVIDER_SITE_OTHER): Payer: Medicare Other | Admitting: Cardiology

## 2020-01-25 ENCOUNTER — Telehealth: Payer: Self-pay

## 2020-01-25 VITALS — BP 124/70 | HR 64 | Temp 97.2°F | Ht 64.0 in | Wt 152.0 lb

## 2020-01-25 DIAGNOSIS — R0602 Shortness of breath: Secondary | ICD-10-CM

## 2020-01-25 DIAGNOSIS — R600 Localized edema: Secondary | ICD-10-CM

## 2020-01-25 NOTE — Telephone Encounter (Signed)

## 2020-01-25 NOTE — Progress Notes (Signed)
Clinical Summary Ms. Mirenda is a 85 y.o.female seen as new consult, referred by Dr Nevada Crane for leg edema   1. Leg edema - pcp had started fluid pill, taking lasix 40mg  daily x 3-4 months - not much improved - + SOB, DOE with walking room to room at home.  - no orthppnea - no chest pains - has had surgeries on both knees.  Jan 2021 labs Cr 0.86, BUN 5  Past Medical History:  Diagnosis Date  . Anemia   . Arthritis   . Cataracts, bilateral    pending surgery May 2013  . Colon cancer ([place]) 1994   Stage IIB  . Diverticulosis   . GERD (gastroesophageal reflux disease)   . HTN (hypertension)   . Hyperlipidemia   . Hypothyroidism   . Migraines    occasional migraines  . Osteoporosis      No Known Allergies   Current Outpatient Medications  Medication Sig Dispense Refill  . acetaminophen (TYLENOL) 325 MG tablet Take 325 mg by mouth daily after lunch.     Marland Kitchen alendronate (FOSAMAX) 70 MG tablet Take 70 mg by mouth every Monday.     . Artificial Tear Solution (SOOTHE XP) SOLN Place 1 drop into both eyes 2 (two) times daily as needed (for dry eyes).    Marland Kitchen aspirin EC 325 MG EC tablet Take 1 tablet (325 mg total) by mouth daily with breakfast. 30 tablet 0  . cholecalciferol (VITAMIN D) 1000 UNITS tablet Take 1,000 Units by mouth daily after lunch.     . docusate sodium (COLACE) 100 MG capsule Take 1 capsule (100 mg total) by mouth 2 (two) times daily. 10 capsule 0  . EDARBI 80 MG TABS Take 80 mg by mouth daily after breakfast. (0800)    . furosemide (LASIX) 20 MG tablet     . IRON PO Take 1 tablet by mouth daily after lunch.     . levothyroxine (SYNTHROID, LEVOTHROID) 50 MCG tablet Take 50 mcg by mouth daily after breakfast. (0800)    . metoprolol tartrate (LOPRESSOR) 50 MG tablet     . Multiple Vitamins-Minerals (PX SENIOR VITAMIN PO) Take 1 tablet by mouth daily after lunch.     Marland Kitchen omeprazole (PRILOSEC) 20 MG capsule Take 20 mg by mouth daily after breakfast. (0800)    .  potassium chloride (K-DUR) 10 MEQ tablet     . potassium chloride SA (K-DUR,KLOR-CON) 20 MEQ tablet Take 1 tablet (20 mEq total) by mouth 2 (two) times daily. 60 tablet 0  . raloxifene (EVISTA) 60 MG tablet Take 60 mg by mouth daily after breakfast. (0800)    . simvastatin (ZOCOR) 40 MG tablet Take 40 mg by mouth at bedtime.     Marland Kitchen telmisartan (MICARDIS) 80 MG tablet     . vitamin E (VITAMIN E) 400 UNIT capsule Take 400 Units by mouth daily after lunch.      No current facility-administered medications for this visit.     Past Surgical History:  Procedure Laterality Date  . CATARACT EXTRACTION W/PHACO Left 08/01/2013   Procedure: CATARACT EXTRACTION PHACO AND INTRAOCULAR LENS PLACEMENT (IOC);  Surgeon: Tonny , MD;  Location: [facility];  Service: Ophthalmology;  Laterality: Left;  CDE:11.47  . CATARACT EXTRACTION W/PHACO Right 08/15/2013   Procedure: CATARACT EXTRACTION PHACO AND INTRAOCULAR LENS PLACEMENT (IOC);  Surgeon: Tonny , MD;  Location: [facility];  Service: Ophthalmology;  Laterality: Right;  CDE:  16.81  . [place]  Right  . COLONOSCOPY  04/01/2012   Rourk-Colonic ulcers at the anastomosis likely NSAID related, although ischemia is not excluded, status post biopsy, left-sided diverticula.  Internal hemorrhoids and anal papilla  . COLONOSCOPY W/ BIOPSIES  05/04/09   Dr. Madolyn Frieze papilla, diverticulosis  . DILATION AND CURETTAGE OF UTERUS    . ESOPHAGOGASTRODUODENOSCOPY  04/01/2012   Rourk-Normal esophagus, small hiatal hernia, deformity, scarring friability of the antrum/prepyloric mucosa suggestive of prior peptic ulcer disease, normal D1 and D2.  . HEMORRHOID SURGERY    . knee arthrocopy(Right)    . TOTAL KNEE ARTHROPLASTY Left 03/18/2017   Procedure: TOTAL KNEE ARTHROPLASTY;  Surgeon: Carole Civil, MD;  Location: [facility];  Service: Orthopedics;  Laterality: Left;     No Known Allergies    Family History  Problem Relation Age of Onset  . Leukemia  Mother 5  . Hypertension Mother   . Heart failure Father 87  . Coronary artery disease Sister        x2  . Stroke Son      Social History Ms. Liming reports that she quit smoking about 27 years ago. Her smoking use included cigarettes. She has a 5.00 pack-year smoking history. She quit smokeless tobacco use about 27 years ago. Ms. Zola reports current alcohol use.   Review of Systems CONSTITUTIONAL: No weight loss, fever, chills, weakness or fatigue.  HEENT: Eyes: No visual loss, blurred vision, double vision or yellow sclerae.No hearing loss, sneezing, congestion, runny nose or sore throat.  SKIN: No rash or itching.  CARDIOVASCULAR: per hpi RESPIRATORY: per hpi.  GASTROINTESTINAL: No anorexia, nausea, vomiting or diarrhea. No abdominal pain or blood.  GENITOURINARY: No burning on urination, no polyuria NEUROLOGICAL: No headache, dizziness, syncope, paralysis, ataxia, numbness or tingling in the extremities. No change in bowel or bladder control.  MUSCULOSKELETAL: No muscle, back pain, joint pain or stiffness.  LYMPHATICS: No enlarged nodes. No history of splenectomy.  PSYCHIATRIC: No history of depression or anxiety.  ENDOCRINOLOGIC: No reports of sweating, cold or heat intolerance. No polyuria or polydipsia.  Marland Kitchen   Physical Examination Today's Vitals   01/25/20 1248 01/25/20 1254  BP: 124/74 124/70  Pulse: 64   Temp: (!) 97.2 F (36.2 C)   SpO2: 94%   Weight: 152 lb (68.9 kg)   Height: 5\' 4"  (1.626 m)    Body mass index is 26.09 kg/m.  Gen: resting comfortably, no acute distress HEENT: no scleral icterus, pupils equal round and reactive, no palptable cervical adenopathy,  CV: RRR, no m/r/g, no jvd Resp: Clear to auscultation bilaterally GI: abdomen is soft, non-tender, non-distended, normal bowel sounds, no hepatosplenomegaly MSK: extremities are warm, trace bilateral edema Skin: warm, no rash Neuro:  no focal deficits Psych: appropriate affect   Assessment  and Plan  1. LE edema - recent LE edema and SOB/DOE - we will obtain an echo to evaluate for any underlying cardiac dysfunction - family will call us to clarify how she is taking her lasix, may adjust dose  - if benign echo, swelling may be more related to venous insufficiency. She has varicose veins, bilateral prior knee operations.       Arnoldo Lenis, M.D

## 2020-01-25 NOTE — Patient Instructions (Signed)
Medication Instructions:  Your physician recommends that you continue on your current medications as directed. Please refer to the Current Medication list given to you today.  *If you need a refill on your cardiac medications before your next appointment, please call your pharmacy*   Lab Work: NONE   If you have labs (blood work) drawn today and your tests are completely normal, you will receive your results only by: Marland Kitchen MyChart Message (if you have MyChart) OR . A paper copy in the mail If you have any lab test that is abnormal or we need to change your treatment, we will call you to review the results.   Testing/Procedures: Your physician has requested that you have an echocardiogram. Echocardiography is a painless test that uses sound waves to create images of your heart. It provides your doctor with information about the size and shape of your heart and how well your heart's chambers and valves are working. This procedure takes approximately one hour. There are no restrictions for this procedure.     Follow-Up: At [facility], you and your health needs are our priority.  As part of our continuing mission to provide you with exceptional heart care, we have created designated Provider Care Teams.  These Care Teams include your primary Cardiologist (physician) and Advanced Practice Providers (APPs -  Physician Assistants and Nurse Practitioners) who all work together to provide you with the care you need, when you need it.  We recommend signing up for the patient portal called "MyChart".  Sign up information is provided on this After Visit Summary.  MyChart is used to connect with patients for Virtual Visits (Telemedicine).  Patients are able to view lab/test results, encounter notes, upcoming appointments, etc.  Non-urgent messages can be sent to your provider as well.   To learn more about what you can do with MyChart, go to [email or web address].    Your next appointment:   3  week(s)  The format for your next appointment:   Virtual Visit   Provider:   Carlyle Dolly, MD   Other Instructions Thank you for choosing [facility]!  Call and update [place] on how you are taking your Lasix.

## 2020-01-31 ENCOUNTER — Other Ambulatory Visit: Payer: Self-pay

## 2020-01-31 ENCOUNTER — Ambulatory Visit (HOSPITAL_COMMUNITY)
Admission: RE | Admit: 2020-01-31 | Discharge: 2020-01-31 | Disposition: A | Discharge status: Home / Self Care | Payer: Medicare Other | Source: Ambulatory Visit | Attending: Cardiology | Admitting: Cardiology

## 2020-01-31 DIAGNOSIS — R0602 Shortness of breath: Secondary | ICD-10-CM

## 2020-01-31 NOTE — Progress Notes (Signed)
  Echocardiogram 2D Echocardiogram has been performed.  Naidelin Gugliotta A Eulla Kochanowski 01/31/2020, 12:36 PM

## 2020-02-01 ENCOUNTER — Ambulatory Visit (HOSPITAL_COMMUNITY): Payer: Medicare Other

## 2020-02-07 DIAGNOSIS — L249 Irritant contact dermatitis, unspecified cause: Secondary | ICD-10-CM | POA: Diagnosis not present

## 2020-02-07 DIAGNOSIS — L810 Postinflammatory hyperpigmentation: Secondary | ICD-10-CM | POA: Diagnosis not present

## 2020-02-14 NOTE — Progress Notes (Signed)
PT made aware. Copy to pcp.

## 2020-02-16 ENCOUNTER — Telehealth (INDEPENDENT_AMBULATORY_CARE_PROVIDER_SITE_OTHER): Payer: Medicare Other | Admitting: Cardiology

## 2020-02-16 ENCOUNTER — Encounter: Payer: Self-pay | Admitting: Cardiology

## 2020-02-16 VITALS — BP 137/89 | HR 58 | Ht 64.0 in | Wt 154.0 lb

## 2020-02-16 DIAGNOSIS — R600 Localized edema: Secondary | ICD-10-CM | POA: Diagnosis not present

## 2020-02-16 DIAGNOSIS — M810 Age-related osteoporosis without current pathological fracture: Secondary | ICD-10-CM | POA: Diagnosis not present

## 2020-02-16 DIAGNOSIS — E039 Hypothyroidism, unspecified: Secondary | ICD-10-CM | POA: Diagnosis not present

## 2020-02-16 DIAGNOSIS — I10 Essential (primary) hypertension: Secondary | ICD-10-CM | POA: Diagnosis not present

## 2020-02-16 DIAGNOSIS — E7849 Other hyperlipidemia: Secondary | ICD-10-CM | POA: Diagnosis not present

## 2020-02-16 DIAGNOSIS — R7301 Impaired fasting glucose: Secondary | ICD-10-CM | POA: Diagnosis not present

## 2020-02-16 NOTE — Patient Instructions (Signed)
Medication Instructions:   Your physician recommends that you continue on your current medications as directed. Please refer to the Current Medication list given to you today.  Labwork:  none  Testing/Procedures:  none  Follow-Up:  Your physician recommends that you schedule a follow-up appointment in: as needed.  Any Other Special Instructions Will Be Listed Below (If Applicable).  If you need a refill on your cardiac medications before your next appointment, please call your pharmacy. 

## 2020-02-16 NOTE — Progress Notes (Signed)
Virtual Visit via Telephone Note   This visit type was conducted due to national recommendations for restrictions regarding the COVID-19 Pandemic (e.g. social distancing) in an effort to limit this patient's exposure and mitigate transmission in our community.  Due to her co-morbid illnesses, this patient is at least at moderate risk for complications without adequate follow up.  This format is felt to be most appropriate for this patient at this time.  The patient did not have access to video technology/had technical difficulties with video requiring transitioning to audio format only (telephone).  All issues noted in this document were discussed and addressed.  No physical exam could be performed with this format.  Please refer to the patient's chart for her  consent to telehealth for [facility].   The patient was identified using 2 identifiers.  Date:  02/16/2020   ID:  Glenda Nolan, DOB 1934-12-10, MRN [id number]  Patient Location: Home Provider Location: Office  PCP:  Celene Squibb, MD  Cardiologist:  Dr Carlyle Dolly MD Electrophysiologist:  None   Evaluation Performed:  Follow-Up Visit  Chief Complaint:  Follow up  History of Present Illness:    Glenda Nolan is a 85 y.o. female seen today for follow up of the following medical problems.    1. Leg edema - pcp had started fluid pill, taking lasix 40mg  daily x 3-4 months - not much improved - + SOB, DOE with walking room to room at home.  - no orthppnea - no chest pains - has had surgeries on both knees.  Jan 2021 labs Cr 0.86, BUN 5  01/2020 echo LVEF [id number], normal diastolic function, normal RV, no significant valve disease  - she is on lasix 40mg  daily. Swelling is stable. Wears compression stockings when she has family to help   The patient does not have symptoms concerning for COVID-19 infection (fever, chills, cough, or new shortness of breath).    Past Medical History:  Diagnosis Date  . Anemia   .  Arthritis   . Cataracts, bilateral    pending surgery May 2013  . Colon cancer ([place]) 1994   Stage IIB  . Diverticulosis   . GERD (gastroesophageal reflux disease)   . HTN (hypertension)   . Hyperlipidemia   . Hypothyroidism   . Migraines    occasional migraines  . Osteoporosis    Past Surgical History:  Procedure Laterality Date  . CATARACT EXTRACTION W/PHACO Left 08/01/2013   Procedure: CATARACT EXTRACTION PHACO AND INTRAOCULAR LENS PLACEMENT (IOC);  Surgeon: Tonny Antanette Richwine, MD;  Location: [facility];  Service: Ophthalmology;  Laterality: Left;  CDE:11.47  . CATARACT EXTRACTION W/PHACO Right 08/15/2013   Procedure: CATARACT EXTRACTION PHACO AND INTRAOCULAR LENS PLACEMENT (IOC);  Surgeon: Tonny Tamakia Porto, MD;  Location: [facility];  Service: Ophthalmology;  Laterality: Right;  CDE:  16.81  . COLON SURGERY  1994   Right  . COLONOSCOPY  04/01/2012   Rourk-Colonic ulcers at the anastomosis likely NSAID related, although ischemia is not excluded, status post biopsy, left-sided diverticula.  Internal hemorrhoids and anal papilla  . COLONOSCOPY W/ BIOPSIES  05/04/09   Dr. Madolyn Frieze papilla, diverticulosis  . DILATION AND CURETTAGE OF UTERUS    . ESOPHAGOGASTRODUODENOSCOPY  04/01/2012   Rourk-Normal esophagus, small hiatal hernia, deformity, scarring friability of the antrum/prepyloric mucosa suggestive of prior peptic ulcer disease, normal D1 and D2.  . HEMORRHOID SURGERY    . knee arthrocopy(Right)    . TOTAL KNEE ARTHROPLASTY Left 03/18/2017   Procedure:  TOTAL KNEE ARTHROPLASTY;  Surgeon: Carole Civil, MD;  Location: [facility];  Service: Orthopedics;  Laterality: Left;     Current Meds  Medication Sig  . acetaminophen (TYLENOL) 325 MG tablet Take 325 mg by mouth daily after lunch.   Marland Kitchen alendronate (FOSAMAX) 70 MG tablet Take 70 mg by mouth every Monday.   . Artificial Tear Solution (SOOTHE XP) SOLN Place 1 drop into both eyes 2 (two) times daily as needed (for dry eyes).  Marland Kitchen aspirin EC 325 MG EC  tablet Take 1 tablet (325 mg total) by mouth daily with breakfast.  . cholecalciferol (VITAMIN D) 1000 UNITS tablet Take 1,000 Units by mouth daily after lunch.   . docusate sodium (COLACE) 100 MG capsule Take 1 capsule (100 mg total) by mouth 2 (two) times daily.  Marland Kitchen donepezil (ARICEPT) 5 MG tablet Take 5 mg by mouth at bedtime.  . furosemide (LASIX) 20 MG tablet Take 40 mg by mouth daily.   . IRON PO Take 1 tablet by mouth daily after lunch.   . levothyroxine (SYNTHROID, LEVOTHROID) 50 MCG tablet Take 75 mcg by mouth daily after breakfast. (0800)  . metoprolol tartrate (LOPRESSOR) 100 MG tablet TAKE (1) TABLET BY MOUTHDAT BREAKFAST THEN TAKE (1) TABLET BY MOUTH AT BEDTIME FOR HEART RATE.  . Multiple Vitamins-Minerals (PX SENIOR VITAMIN PO) Take 1 tablet by mouth daily after lunch.   Marland Kitchen omeprazole (PRILOSEC) 20 MG capsule Take 20 mg by mouth daily after breakfast. (0800)  . potassium chloride (K-DUR) 10 MEQ tablet   . potassium chloride SA (K-DUR,KLOR-CON) 20 MEQ tablet Take 1 tablet (20 mEq total) by mouth 2 (two) times daily.  . raloxifene (EVISTA) 60 MG tablet Take 60 mg by mouth daily after breakfast. (0800)  . simvastatin (ZOCOR) 40 MG tablet Take 40 mg by mouth at bedtime.   Marland Kitchen telmisartan (MICARDIS) 80 MG tablet Take 80 mg by mouth daily.   . [DISCONTINUED] EDARBI 80 MG TABS Take 80 mg by mouth daily after breakfast. (0800)  . [DISCONTINUED] vitamin E (VITAMIN E) 400 UNIT capsule Take 400 Units by mouth daily after lunch.      Allergies:   Patient has no known allergies.   Social History   Tobacco Use  . Smoking status: Former Smoker    Packs/day: 0.50    Years: 10.00    Pack years: 5.00    Types: Cigarettes    Quit date: 11/24/1992    Years since quitting: 27.2  . Smokeless tobacco: Former Systems developer    Quit date: 12/22/1992  Substance Use Topics  . Alcohol use: Yes    Comment: a glass of wine once a year  . Drug use: No     Family Hx: The patient's family history includes  Coronary artery disease in her sister; Heart failure (age of onset: 99) in her father; Hypertension in her mother; Leukemia (age of onset: 87) in her mother; Stroke in her son.  ROS:   Please see the history of present illness.     All other systems reviewed and are negative.   Prior CV studies:   The following studies were reviewed today:  01/2020 echo IMPRESSIONS    1. Left ventricular ejection fraction, by estimation, is 60 to 65%. The  left ventricle has normal function. The left ventricle has no regional  wall motion abnormalities. Left ventricular diastolic parameters were  normal.  2. Right ventricular systolic function is normal. The right ventricular  size is normal. There is normal  pulmonary artery systolic pressure. The  estimated right ventricular systolic pressure is [id number] mmHg.  3. The mitral valve is grossly normal. Trivial mitral valve  regurgitation.  4. The aortic valve is tricuspid. Aortic valve regurgitation is trivial.   Labs/Other Tests and Data Reviewed:    EKG:  No ECG reviewed.  Recent Labs: No results found for requested labs within last 8760 hours.   Recent Lipid Panel No results found for: CHOL, TRIG, HDL, CHOLHDL, LDLCALC, LDLDIRECT  Wt Readings from Last 3 Encounters:  02/16/20 154 lb (69.9 kg)  01/25/20 152 lb (68.9 kg)  08/15/19 151 lb 12.8 oz (68.9 kg)     Objective:    Vital Signs:  BP 137/89   Pulse (!) 58   Ht 5\' 4"  (1.626 m)   Wt 154 lb (69.9 kg)   BMI 26.43 kg/m    Normal affect. Normal speech pattern and tone. Comfortable, no apparent distress. No audible signs of SOB or wheezing.   ASSESSMENT & PLAN:    1. LE edema - normal echo, overall appears to be noncardiac related LE edema. Likely from venous insufficiency given her advanced age, prior leg surgerys, and varicose veins - can continue diuretic and compression stockings. Would avoid aggressive diuretic dosing.      COVID-19 Education: The signs and symptoms  of COVID-19 were discussed with the patient and how to seek care for testing (follow up with PCP or arrange E-visit).  The importance of social distancing was discussed today.  Time:   Today, I have spent 15 minutes with the patient with telehealth technology discussing the above problems.     Medication Adjustments/Labs and Tests Ordered: Current medicines are reviewed at length with the patient today.  Concerns regarding medicines are outlined above.   Tests Ordered: No orders of the defined types were placed in this encounter.   Medication Changes: No orders of the defined types were placed in this encounter.   Follow Up:  As needed  Signed, Carlyle Dolly, MD  02/16/2020 12:34 PM    [place]

## 2020-02-19 DIAGNOSIS — I10 Essential (primary) hypertension: Secondary | ICD-10-CM | POA: Diagnosis not present

## 2020-02-19 DIAGNOSIS — E039 Hypothyroidism, unspecified: Secondary | ICD-10-CM | POA: Diagnosis not present

## 2020-02-19 DIAGNOSIS — E7849 Other hyperlipidemia: Secondary | ICD-10-CM | POA: Diagnosis not present

## 2020-02-19 DIAGNOSIS — M810 Age-related osteoporosis without current pathological fracture: Secondary | ICD-10-CM | POA: Diagnosis not present

## 2020-02-19 DIAGNOSIS — Z9181 History of falling: Secondary | ICD-10-CM | POA: Diagnosis not present

## 2020-02-19 DIAGNOSIS — Z7982 Long term (current) use of aspirin: Secondary | ICD-10-CM | POA: Diagnosis not present

## 2020-02-19 DIAGNOSIS — Z7983 Long term (current) use of bisphosphonates: Secondary | ICD-10-CM | POA: Diagnosis not present

## 2020-02-19 DIAGNOSIS — M1712 Unilateral primary osteoarthritis, left knee: Secondary | ICD-10-CM | POA: Diagnosis not present

## 2020-02-19 DIAGNOSIS — R7301 Impaired fasting glucose: Secondary | ICD-10-CM | POA: Diagnosis not present

## 2020-02-21 DIAGNOSIS — Z9181 History of falling: Secondary | ICD-10-CM | POA: Diagnosis not present

## 2020-02-21 DIAGNOSIS — E7849 Other hyperlipidemia: Secondary | ICD-10-CM | POA: Diagnosis not present

## 2020-02-21 DIAGNOSIS — M1712 Unilateral primary osteoarthritis, left knee: Secondary | ICD-10-CM | POA: Diagnosis not present

## 2020-02-21 DIAGNOSIS — M810 Age-related osteoporosis without current pathological fracture: Secondary | ICD-10-CM | POA: Diagnosis not present

## 2020-02-21 DIAGNOSIS — R7301 Impaired fasting glucose: Secondary | ICD-10-CM | POA: Diagnosis not present

## 2020-02-21 DIAGNOSIS — Z7983 Long term (current) use of bisphosphonates: Secondary | ICD-10-CM | POA: Diagnosis not present

## 2020-02-21 DIAGNOSIS — I10 Essential (primary) hypertension: Secondary | ICD-10-CM | POA: Diagnosis not present

## 2020-02-21 DIAGNOSIS — E039 Hypothyroidism, unspecified: Secondary | ICD-10-CM | POA: Diagnosis not present

## 2020-02-21 DIAGNOSIS — Z7982 Long term (current) use of aspirin: Secondary | ICD-10-CM | POA: Diagnosis not present

## 2020-02-23 DIAGNOSIS — M810 Age-related osteoporosis without current pathological fracture: Secondary | ICD-10-CM | POA: Diagnosis not present

## 2020-02-23 DIAGNOSIS — R7301 Impaired fasting glucose: Secondary | ICD-10-CM | POA: Diagnosis not present

## 2020-02-23 DIAGNOSIS — Z7983 Long term (current) use of bisphosphonates: Secondary | ICD-10-CM | POA: Diagnosis not present

## 2020-02-23 DIAGNOSIS — E039 Hypothyroidism, unspecified: Secondary | ICD-10-CM | POA: Diagnosis not present

## 2020-02-23 DIAGNOSIS — M1712 Unilateral primary osteoarthritis, left knee: Secondary | ICD-10-CM | POA: Diagnosis not present

## 2020-02-23 DIAGNOSIS — Z7982 Long term (current) use of aspirin: Secondary | ICD-10-CM | POA: Diagnosis not present

## 2020-02-23 DIAGNOSIS — I10 Essential (primary) hypertension: Secondary | ICD-10-CM | POA: Diagnosis not present

## 2020-02-23 DIAGNOSIS — Z9181 History of falling: Secondary | ICD-10-CM | POA: Diagnosis not present

## 2020-02-23 DIAGNOSIS — E7849 Other hyperlipidemia: Secondary | ICD-10-CM | POA: Diagnosis not present

## 2020-02-23 IMAGING — DX DG LUMBAR SPINE COMPLETE 4+V
5 series · 5 of 5 positions shown · non-contrast
Comparison: 09/18/2008.

CLINICAL DATA: Chronic back pain.

EXAM:
LUMBAR SPINE - COMPLETE 4+ VIEW

[l-spine ap]
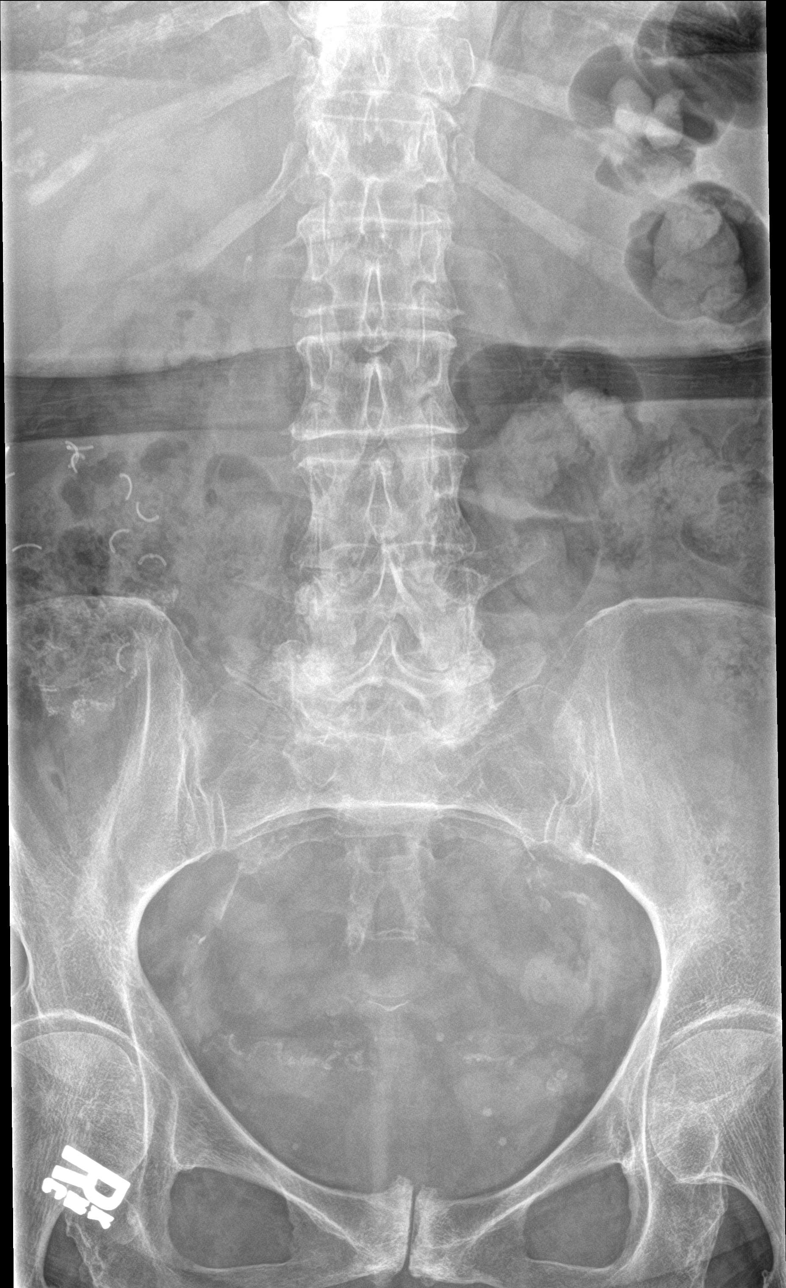

[l-spine obl (1 of 2)]
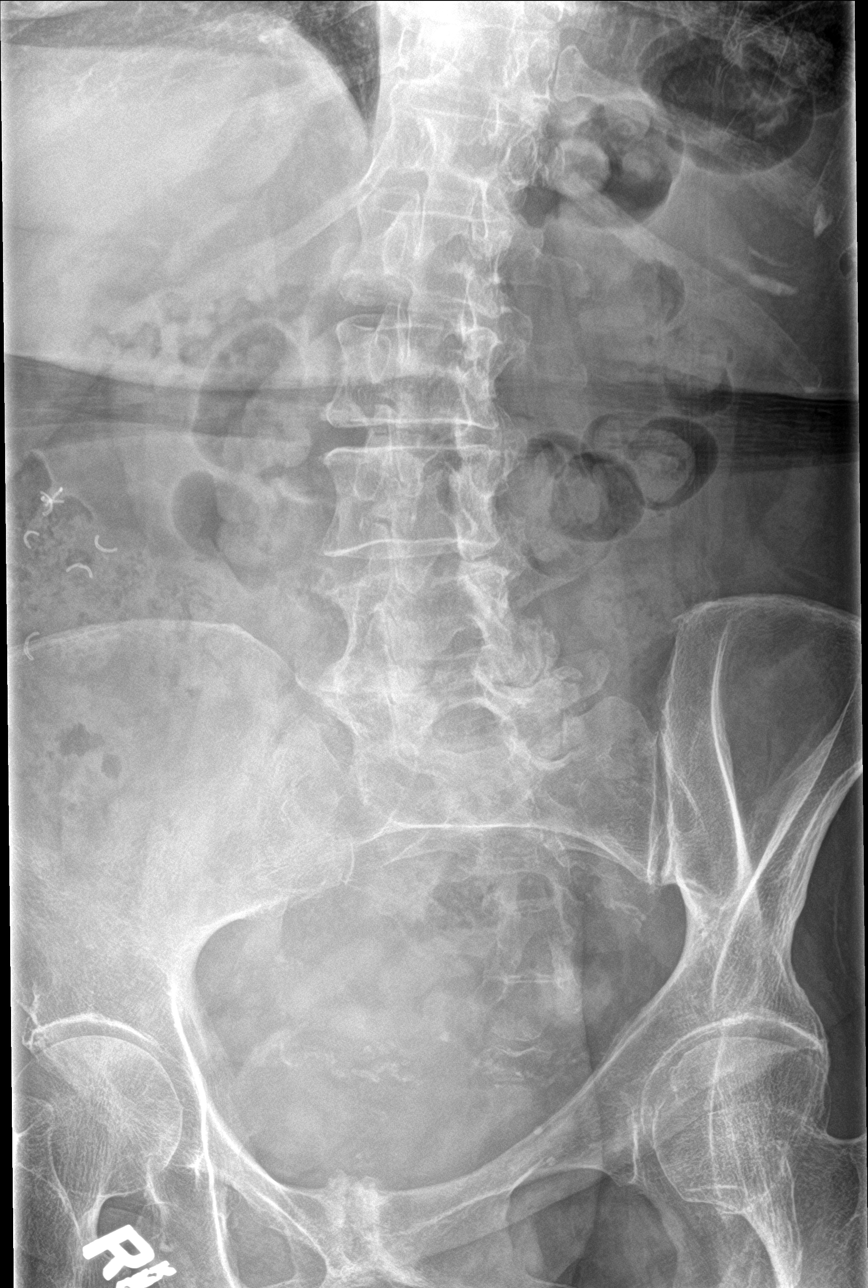

[l-spine obl (2 of 2)]
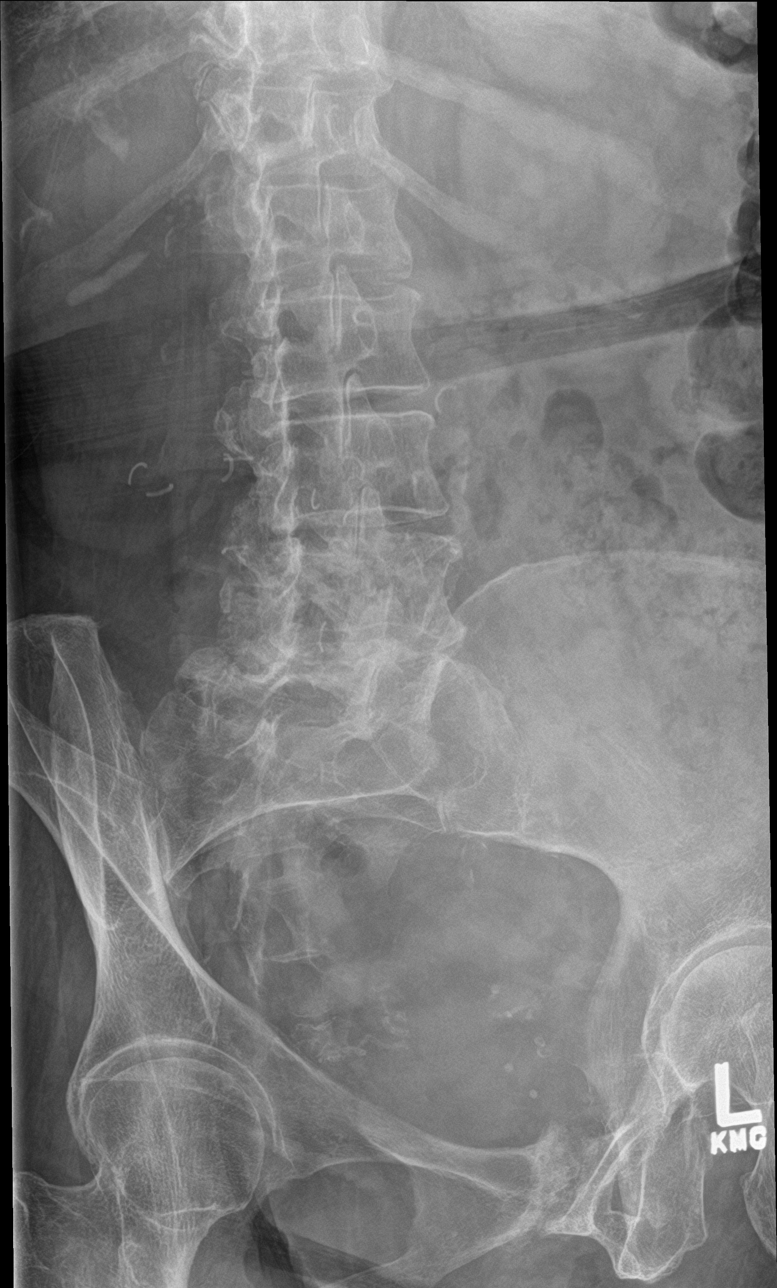

[l-spine lat]
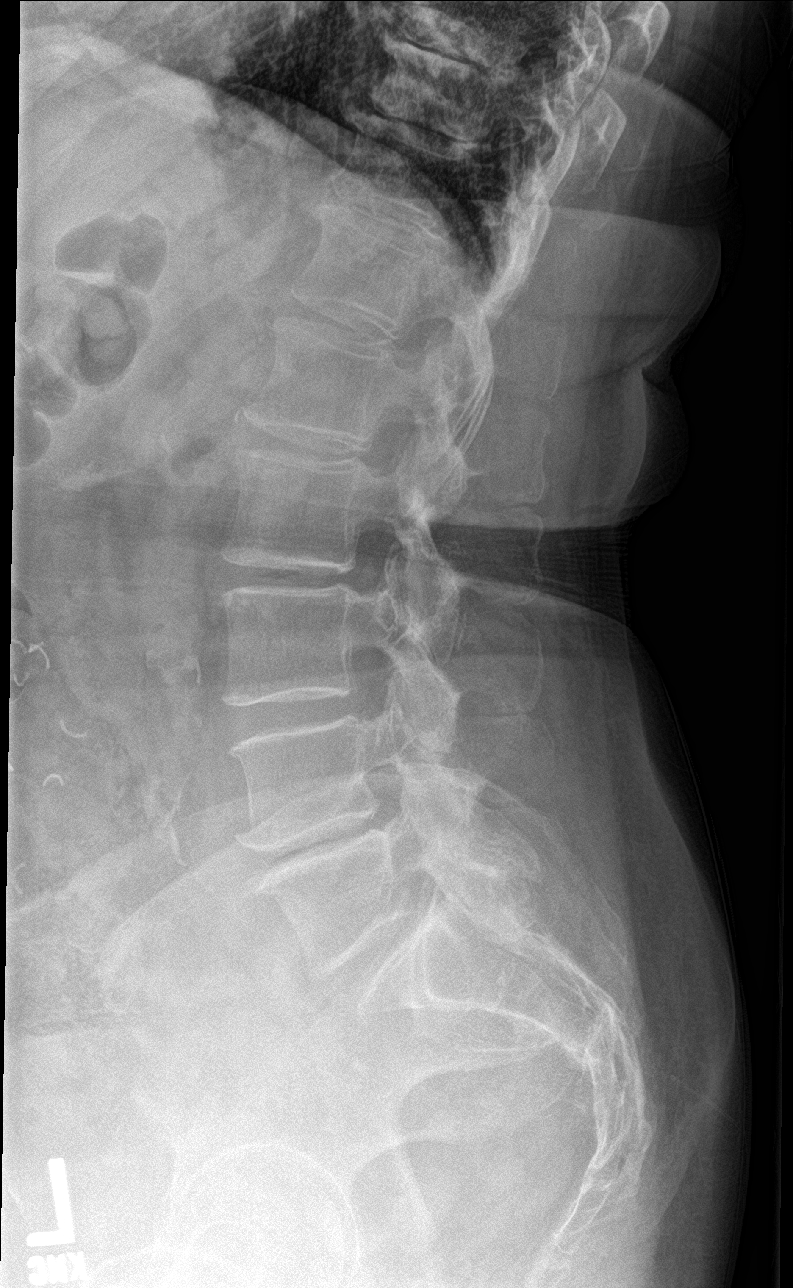

[l-spine spot]
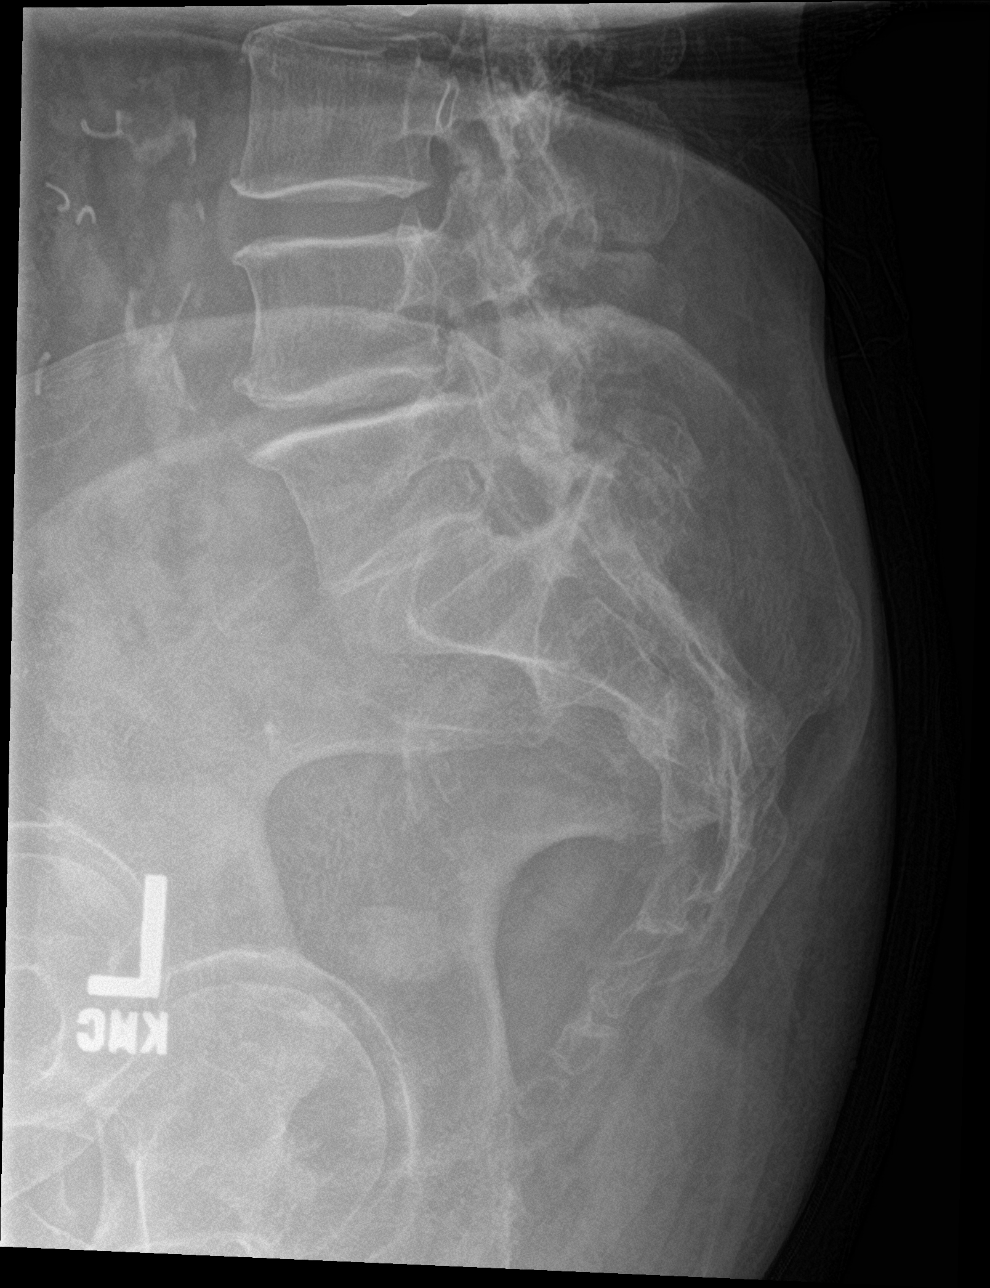

[5 of 5 positions shown; findings below may reference images not displayed]

FINDINGS: Diffuse multilevel degenerative change lumbar spine with mild
scoliosis concave left. Degenerative changes both hips. No acute
bony abnormality identified. Pelvic calcifications consistent
phleboliths. Aortoiliac atherosclerotic vascular calcification.
Surgical clips and sutures noted over the right abdomen.
IMPRESSION: 1. Diffuse multilevel degenerative change lumbar spine. Mild
scoliosis lumbar spine concave left. Degenerative changes both hips.
No acute bony abnormality.

2.  Aortoiliac atherosclerotic vascular disease.

## 2020-02-24 DIAGNOSIS — E039 Hypothyroidism, unspecified: Secondary | ICD-10-CM | POA: Diagnosis not present

## 2020-02-24 DIAGNOSIS — Z7983 Long term (current) use of bisphosphonates: Secondary | ICD-10-CM | POA: Diagnosis not present

## 2020-02-24 DIAGNOSIS — M1712 Unilateral primary osteoarthritis, left knee: Secondary | ICD-10-CM | POA: Diagnosis not present

## 2020-02-24 DIAGNOSIS — Z9181 History of falling: Secondary | ICD-10-CM | POA: Diagnosis not present

## 2020-02-24 DIAGNOSIS — Z7982 Long term (current) use of aspirin: Secondary | ICD-10-CM | POA: Diagnosis not present

## 2020-02-24 DIAGNOSIS — R7301 Impaired fasting glucose: Secondary | ICD-10-CM | POA: Diagnosis not present

## 2020-02-24 DIAGNOSIS — M810 Age-related osteoporosis without current pathological fracture: Secondary | ICD-10-CM | POA: Diagnosis not present

## 2020-02-24 DIAGNOSIS — E7849 Other hyperlipidemia: Secondary | ICD-10-CM | POA: Diagnosis not present

## 2020-02-24 DIAGNOSIS — I10 Essential (primary) hypertension: Secondary | ICD-10-CM | POA: Diagnosis not present

## 2020-02-27 DIAGNOSIS — R7301 Impaired fasting glucose: Secondary | ICD-10-CM | POA: Diagnosis not present

## 2020-02-27 DIAGNOSIS — Z9181 History of falling: Secondary | ICD-10-CM | POA: Diagnosis not present

## 2020-02-27 DIAGNOSIS — I10 Essential (primary) hypertension: Secondary | ICD-10-CM | POA: Diagnosis not present

## 2020-02-27 DIAGNOSIS — E7849 Other hyperlipidemia: Secondary | ICD-10-CM | POA: Diagnosis not present

## 2020-02-27 DIAGNOSIS — E039 Hypothyroidism, unspecified: Secondary | ICD-10-CM | POA: Diagnosis not present

## 2020-02-27 DIAGNOSIS — Z7982 Long term (current) use of aspirin: Secondary | ICD-10-CM | POA: Diagnosis not present

## 2020-02-27 DIAGNOSIS — M1712 Unilateral primary osteoarthritis, left knee: Secondary | ICD-10-CM | POA: Diagnosis not present

## 2020-02-27 DIAGNOSIS — M810 Age-related osteoporosis without current pathological fracture: Secondary | ICD-10-CM | POA: Diagnosis not present

## 2020-02-27 DIAGNOSIS — Z7983 Long term (current) use of bisphosphonates: Secondary | ICD-10-CM | POA: Diagnosis not present

## 2020-02-29 DIAGNOSIS — Z7982 Long term (current) use of aspirin: Secondary | ICD-10-CM | POA: Diagnosis not present

## 2020-02-29 DIAGNOSIS — Z7983 Long term (current) use of bisphosphonates: Secondary | ICD-10-CM | POA: Diagnosis not present

## 2020-02-29 DIAGNOSIS — M1712 Unilateral primary osteoarthritis, left knee: Secondary | ICD-10-CM | POA: Diagnosis not present

## 2020-02-29 DIAGNOSIS — R7301 Impaired fasting glucose: Secondary | ICD-10-CM | POA: Diagnosis not present

## 2020-02-29 DIAGNOSIS — Z9181 History of falling: Secondary | ICD-10-CM | POA: Diagnosis not present

## 2020-02-29 DIAGNOSIS — E7849 Other hyperlipidemia: Secondary | ICD-10-CM | POA: Diagnosis not present

## 2020-02-29 DIAGNOSIS — I10 Essential (primary) hypertension: Secondary | ICD-10-CM | POA: Diagnosis not present

## 2020-02-29 DIAGNOSIS — E039 Hypothyroidism, unspecified: Secondary | ICD-10-CM | POA: Diagnosis not present

## 2020-02-29 DIAGNOSIS — M810 Age-related osteoporosis without current pathological fracture: Secondary | ICD-10-CM | POA: Diagnosis not present

## 2020-03-01 DIAGNOSIS — E039 Hypothyroidism, unspecified: Secondary | ICD-10-CM | POA: Diagnosis not present

## 2020-03-01 DIAGNOSIS — R7301 Impaired fasting glucose: Secondary | ICD-10-CM | POA: Diagnosis not present

## 2020-03-01 DIAGNOSIS — Z7983 Long term (current) use of bisphosphonates: Secondary | ICD-10-CM | POA: Diagnosis not present

## 2020-03-01 DIAGNOSIS — Z9181 History of falling: Secondary | ICD-10-CM | POA: Diagnosis not present

## 2020-03-01 DIAGNOSIS — E7849 Other hyperlipidemia: Secondary | ICD-10-CM | POA: Diagnosis not present

## 2020-03-01 DIAGNOSIS — Z7982 Long term (current) use of aspirin: Secondary | ICD-10-CM | POA: Diagnosis not present

## 2020-03-01 DIAGNOSIS — M1712 Unilateral primary osteoarthritis, left knee: Secondary | ICD-10-CM | POA: Diagnosis not present

## 2020-03-01 DIAGNOSIS — I10 Essential (primary) hypertension: Secondary | ICD-10-CM | POA: Diagnosis not present

## 2020-03-01 DIAGNOSIS — M810 Age-related osteoporosis without current pathological fracture: Secondary | ICD-10-CM | POA: Diagnosis not present

## 2020-03-05 DIAGNOSIS — Z9181 History of falling: Secondary | ICD-10-CM | POA: Diagnosis not present

## 2020-03-05 DIAGNOSIS — Z7982 Long term (current) use of aspirin: Secondary | ICD-10-CM | POA: Diagnosis not present

## 2020-03-05 DIAGNOSIS — Z7983 Long term (current) use of bisphosphonates: Secondary | ICD-10-CM | POA: Diagnosis not present

## 2020-03-05 DIAGNOSIS — E7849 Other hyperlipidemia: Secondary | ICD-10-CM | POA: Diagnosis not present

## 2020-03-05 DIAGNOSIS — R7301 Impaired fasting glucose: Secondary | ICD-10-CM | POA: Diagnosis not present

## 2020-03-05 DIAGNOSIS — E039 Hypothyroidism, unspecified: Secondary | ICD-10-CM | POA: Diagnosis not present

## 2020-03-05 DIAGNOSIS — M1712 Unilateral primary osteoarthritis, left knee: Secondary | ICD-10-CM | POA: Diagnosis not present

## 2020-03-05 DIAGNOSIS — I10 Essential (primary) hypertension: Secondary | ICD-10-CM | POA: Diagnosis not present

## 2020-03-05 DIAGNOSIS — M810 Age-related osteoporosis without current pathological fracture: Secondary | ICD-10-CM | POA: Diagnosis not present

## 2020-03-06 DIAGNOSIS — Z7982 Long term (current) use of aspirin: Secondary | ICD-10-CM | POA: Diagnosis not present

## 2020-03-06 DIAGNOSIS — E7849 Other hyperlipidemia: Secondary | ICD-10-CM | POA: Diagnosis not present

## 2020-03-06 DIAGNOSIS — M1712 Unilateral primary osteoarthritis, left knee: Secondary | ICD-10-CM | POA: Diagnosis not present

## 2020-03-06 DIAGNOSIS — Z7983 Long term (current) use of bisphosphonates: Secondary | ICD-10-CM | POA: Diagnosis not present

## 2020-03-06 DIAGNOSIS — Z9181 History of falling: Secondary | ICD-10-CM | POA: Diagnosis not present

## 2020-03-06 DIAGNOSIS — M810 Age-related osteoporosis without current pathological fracture: Secondary | ICD-10-CM | POA: Diagnosis not present

## 2020-03-06 DIAGNOSIS — E039 Hypothyroidism, unspecified: Secondary | ICD-10-CM | POA: Diagnosis not present

## 2020-03-06 DIAGNOSIS — I10 Essential (primary) hypertension: Secondary | ICD-10-CM | POA: Diagnosis not present

## 2020-03-06 DIAGNOSIS — R7301 Impaired fasting glucose: Secondary | ICD-10-CM | POA: Diagnosis not present

## 2020-03-07 DIAGNOSIS — E039 Hypothyroidism, unspecified: Secondary | ICD-10-CM | POA: Diagnosis not present

## 2020-03-07 DIAGNOSIS — Z7982 Long term (current) use of aspirin: Secondary | ICD-10-CM | POA: Diagnosis not present

## 2020-03-07 DIAGNOSIS — Z7983 Long term (current) use of bisphosphonates: Secondary | ICD-10-CM | POA: Diagnosis not present

## 2020-03-07 DIAGNOSIS — I10 Essential (primary) hypertension: Secondary | ICD-10-CM | POA: Diagnosis not present

## 2020-03-07 DIAGNOSIS — Z9181 History of falling: Secondary | ICD-10-CM | POA: Diagnosis not present

## 2020-03-07 DIAGNOSIS — M1712 Unilateral primary osteoarthritis, left knee: Secondary | ICD-10-CM | POA: Diagnosis not present

## 2020-03-07 DIAGNOSIS — M810 Age-related osteoporosis without current pathological fracture: Secondary | ICD-10-CM | POA: Diagnosis not present

## 2020-03-07 DIAGNOSIS — E7849 Other hyperlipidemia: Secondary | ICD-10-CM | POA: Diagnosis not present

## 2020-03-07 DIAGNOSIS — R7301 Impaired fasting glucose: Secondary | ICD-10-CM | POA: Diagnosis not present

## 2020-03-09 DIAGNOSIS — E7849 Other hyperlipidemia: Secondary | ICD-10-CM | POA: Diagnosis not present

## 2020-03-09 DIAGNOSIS — S39012A Strain of muscle, fascia and tendon of lower back, initial encounter: Secondary | ICD-10-CM | POA: Diagnosis not present

## 2020-03-09 DIAGNOSIS — Z7982 Long term (current) use of aspirin: Secondary | ICD-10-CM | POA: Diagnosis not present

## 2020-03-09 DIAGNOSIS — M1712 Unilateral primary osteoarthritis, left knee: Secondary | ICD-10-CM | POA: Diagnosis not present

## 2020-03-09 DIAGNOSIS — Z9181 History of falling: Secondary | ICD-10-CM | POA: Diagnosis not present

## 2020-03-09 DIAGNOSIS — Z7983 Long term (current) use of bisphosphonates: Secondary | ICD-10-CM | POA: Diagnosis not present

## 2020-03-09 DIAGNOSIS — M810 Age-related osteoporosis without current pathological fracture: Secondary | ICD-10-CM | POA: Diagnosis not present

## 2020-03-09 DIAGNOSIS — E039 Hypothyroidism, unspecified: Secondary | ICD-10-CM | POA: Diagnosis not present

## 2020-03-09 DIAGNOSIS — I10 Essential (primary) hypertension: Secondary | ICD-10-CM | POA: Diagnosis not present

## 2020-03-09 DIAGNOSIS — R7301 Impaired fasting glucose: Secondary | ICD-10-CM | POA: Diagnosis not present

## 2020-03-12 DIAGNOSIS — E039 Hypothyroidism, unspecified: Secondary | ICD-10-CM | POA: Diagnosis not present

## 2020-03-12 DIAGNOSIS — Z7982 Long term (current) use of aspirin: Secondary | ICD-10-CM | POA: Diagnosis not present

## 2020-03-12 DIAGNOSIS — M810 Age-related osteoporosis without current pathological fracture: Secondary | ICD-10-CM | POA: Diagnosis not present

## 2020-03-12 DIAGNOSIS — Z7983 Long term (current) use of bisphosphonates: Secondary | ICD-10-CM | POA: Diagnosis not present

## 2020-03-12 DIAGNOSIS — I10 Essential (primary) hypertension: Secondary | ICD-10-CM | POA: Diagnosis not present

## 2020-03-12 DIAGNOSIS — E7849 Other hyperlipidemia: Secondary | ICD-10-CM | POA: Diagnosis not present

## 2020-03-12 DIAGNOSIS — M1712 Unilateral primary osteoarthritis, left knee: Secondary | ICD-10-CM | POA: Diagnosis not present

## 2020-03-12 DIAGNOSIS — Z9181 History of falling: Secondary | ICD-10-CM | POA: Diagnosis not present

## 2020-03-12 DIAGNOSIS — R7301 Impaired fasting glucose: Secondary | ICD-10-CM | POA: Diagnosis not present

## 2020-03-15 DIAGNOSIS — E039 Hypothyroidism, unspecified: Secondary | ICD-10-CM | POA: Diagnosis not present

## 2020-03-15 DIAGNOSIS — R7301 Impaired fasting glucose: Secondary | ICD-10-CM | POA: Diagnosis not present

## 2020-03-15 DIAGNOSIS — M810 Age-related osteoporosis without current pathological fracture: Secondary | ICD-10-CM | POA: Diagnosis not present

## 2020-03-15 DIAGNOSIS — M1712 Unilateral primary osteoarthritis, left knee: Secondary | ICD-10-CM | POA: Diagnosis not present

## 2020-03-15 DIAGNOSIS — Z7983 Long term (current) use of bisphosphonates: Secondary | ICD-10-CM | POA: Diagnosis not present

## 2020-03-15 DIAGNOSIS — Z7982 Long term (current) use of aspirin: Secondary | ICD-10-CM | POA: Diagnosis not present

## 2020-03-15 DIAGNOSIS — E7849 Other hyperlipidemia: Secondary | ICD-10-CM | POA: Diagnosis not present

## 2020-03-15 DIAGNOSIS — Z9181 History of falling: Secondary | ICD-10-CM | POA: Diagnosis not present

## 2020-03-15 DIAGNOSIS — I10 Essential (primary) hypertension: Secondary | ICD-10-CM | POA: Diagnosis not present

## 2020-04-02 DIAGNOSIS — I10 Essential (primary) hypertension: Secondary | ICD-10-CM | POA: Diagnosis not present

## 2020-04-02 DIAGNOSIS — E039 Hypothyroidism, unspecified: Secondary | ICD-10-CM | POA: Diagnosis not present

## 2020-04-02 DIAGNOSIS — E782 Mixed hyperlipidemia: Secondary | ICD-10-CM | POA: Diagnosis not present

## 2020-04-09 ENCOUNTER — Ambulatory Visit: Payer: Medicare Other | Admitting: Orthopedic Surgery

## 2020-04-11 ENCOUNTER — Ambulatory Visit: Payer: Medicare Other | Admitting: Orthopedic Surgery

## 2020-04-12 ENCOUNTER — Other Ambulatory Visit: Payer: Self-pay

## 2020-04-12 ENCOUNTER — Encounter: Payer: Self-pay | Admitting: Orthopedic Surgery

## 2020-04-12 ENCOUNTER — Ambulatory Visit: Payer: Medicare Other | Admitting: Orthopedic Surgery

## 2020-04-12 ENCOUNTER — Ambulatory Visit: Payer: Medicare Other

## 2020-04-12 VITALS — BP 170/100 | HR 137 | Ht 64.0 in | Wt 146.0 lb

## 2020-04-12 DIAGNOSIS — M1712 Unilateral primary osteoarthritis, left knee: Secondary | ICD-10-CM

## 2020-04-12 DIAGNOSIS — Z96652 Presence of left artificial knee joint: Secondary | ICD-10-CM | POA: Diagnosis not present

## 2020-04-12 NOTE — Progress Notes (Signed)
Patient ID: Glenda Nolan, female   DOB: 25-Jun-1935, 85 y.o.   MRN: [id number]  Chief Complaint  Patient presents with  . Routine Post Op    left total knee replaced 03/18/17 improving    HPI Glenda Nolan is a 85 y.o. female.   Status post left  total knee arthroplasty. The patient is doing well the knee implant is functioning well.     Physical Exam BP (!) 170/100   Pulse (!) 137   Ht 5\' 4"  (1.626 m)   Wt 146 lb (66.2 kg)   BMI 25.06 kg/m   Gait: no limp   Assist device cane   ROM: 0-125    Data Reviewed Today's imaging shows stable total knee implant without loosening or complication  Assessment Encounter Diagnoses  Name Primary?  . S/P TKR (total knee replacement), left Yes  . Unilateral primary osteoarthritis, left knee    Doing well   Plan Return 1 yr     Glenda Nolan 04/12/2020, 2:52 PM

## 2020-05-23 DIAGNOSIS — E782 Mixed hyperlipidemia: Secondary | ICD-10-CM | POA: Diagnosis not present

## 2020-05-23 DIAGNOSIS — I10 Essential (primary) hypertension: Secondary | ICD-10-CM | POA: Diagnosis not present

## 2020-05-23 DIAGNOSIS — E039 Hypothyroidism, unspecified: Secondary | ICD-10-CM | POA: Diagnosis not present

## 2020-06-06 DIAGNOSIS — Z23 Encounter for immunization: Secondary | ICD-10-CM | POA: Diagnosis not present

## 2020-06-06 DIAGNOSIS — R2243 Localized swelling, mass and lump, lower limb, bilateral: Secondary | ICD-10-CM | POA: Diagnosis not present

## 2020-06-06 DIAGNOSIS — R0602 Shortness of breath: Secondary | ICD-10-CM | POA: Diagnosis not present

## 2020-06-06 DIAGNOSIS — L810 Postinflammatory hyperpigmentation: Secondary | ICD-10-CM | POA: Diagnosis not present

## 2020-06-06 DIAGNOSIS — R229 Localized swelling, mass and lump, unspecified: Secondary | ICD-10-CM | POA: Diagnosis not present

## 2020-06-06 DIAGNOSIS — I10 Essential (primary) hypertension: Secondary | ICD-10-CM | POA: Diagnosis not present

## 2020-06-06 DIAGNOSIS — R7301 Impaired fasting glucose: Secondary | ICD-10-CM | POA: Diagnosis not present

## 2020-06-06 DIAGNOSIS — M816 Localized osteoporosis [Lequesne]: Secondary | ICD-10-CM | POA: Diagnosis not present

## 2020-06-06 DIAGNOSIS — E1169 Type 2 diabetes mellitus with other specified complication: Secondary | ICD-10-CM | POA: Diagnosis not present

## 2020-06-26 DIAGNOSIS — I10 Essential (primary) hypertension: Secondary | ICD-10-CM | POA: Diagnosis not present

## 2020-06-26 DIAGNOSIS — M810 Age-related osteoporosis without current pathological fracture: Secondary | ICD-10-CM | POA: Diagnosis not present

## 2020-06-26 DIAGNOSIS — E1169 Type 2 diabetes mellitus with other specified complication: Secondary | ICD-10-CM | POA: Diagnosis not present

## 2020-06-26 DIAGNOSIS — E039 Hypothyroidism, unspecified: Secondary | ICD-10-CM | POA: Diagnosis not present

## 2020-06-26 DIAGNOSIS — E782 Mixed hyperlipidemia: Secondary | ICD-10-CM | POA: Diagnosis not present

## 2020-08-13 DIAGNOSIS — E1169 Type 2 diabetes mellitus with other specified complication: Secondary | ICD-10-CM | POA: Diagnosis not present

## 2020-08-13 DIAGNOSIS — I10 Essential (primary) hypertension: Secondary | ICD-10-CM | POA: Diagnosis not present

## 2020-08-13 DIAGNOSIS — E039 Hypothyroidism, unspecified: Secondary | ICD-10-CM | POA: Diagnosis not present

## 2020-08-13 DIAGNOSIS — E782 Mixed hyperlipidemia: Secondary | ICD-10-CM | POA: Diagnosis not present

## 2020-08-13 DIAGNOSIS — M810 Age-related osteoporosis without current pathological fracture: Secondary | ICD-10-CM | POA: Diagnosis not present

## 2020-09-11 DIAGNOSIS — M810 Age-related osteoporosis without current pathological fracture: Secondary | ICD-10-CM | POA: Diagnosis not present

## 2020-09-11 DIAGNOSIS — E782 Mixed hyperlipidemia: Secondary | ICD-10-CM | POA: Diagnosis not present

## 2020-09-11 DIAGNOSIS — I10 Essential (primary) hypertension: Secondary | ICD-10-CM | POA: Diagnosis not present

## 2020-09-11 DIAGNOSIS — E039 Hypothyroidism, unspecified: Secondary | ICD-10-CM | POA: Diagnosis not present

## 2020-09-11 DIAGNOSIS — E1169 Type 2 diabetes mellitus with other specified complication: Secondary | ICD-10-CM | POA: Diagnosis not present

## 2020-10-31 DIAGNOSIS — L810 Postinflammatory hyperpigmentation: Secondary | ICD-10-CM | POA: Diagnosis not present

## 2020-10-31 DIAGNOSIS — R7301 Impaired fasting glucose: Secondary | ICD-10-CM | POA: Diagnosis not present

## 2020-10-31 DIAGNOSIS — I10 Essential (primary) hypertension: Secondary | ICD-10-CM | POA: Diagnosis not present

## 2020-10-31 DIAGNOSIS — R229 Localized swelling, mass and lump, unspecified: Secondary | ICD-10-CM | POA: Diagnosis not present

## 2020-10-31 DIAGNOSIS — R2243 Localized swelling, mass and lump, lower limb, bilateral: Secondary | ICD-10-CM | POA: Diagnosis not present

## 2020-10-31 DIAGNOSIS — R0602 Shortness of breath: Secondary | ICD-10-CM | POA: Diagnosis not present

## 2020-10-31 DIAGNOSIS — E1169 Type 2 diabetes mellitus with other specified complication: Secondary | ICD-10-CM | POA: Diagnosis not present

## 2020-10-31 DIAGNOSIS — Z23 Encounter for immunization: Secondary | ICD-10-CM | POA: Diagnosis not present

## 2020-11-06 DIAGNOSIS — M810 Age-related osteoporosis without current pathological fracture: Secondary | ICD-10-CM | POA: Diagnosis not present

## 2020-11-06 DIAGNOSIS — E1169 Type 2 diabetes mellitus with other specified complication: Secondary | ICD-10-CM | POA: Diagnosis not present

## 2020-11-06 DIAGNOSIS — E782 Mixed hyperlipidemia: Secondary | ICD-10-CM | POA: Diagnosis not present

## 2020-11-06 DIAGNOSIS — E039 Hypothyroidism, unspecified: Secondary | ICD-10-CM | POA: Diagnosis not present

## 2020-11-06 DIAGNOSIS — Z0001 Encounter for general adult medical examination with abnormal findings: Secondary | ICD-10-CM | POA: Diagnosis not present

## 2020-11-06 DIAGNOSIS — I10 Essential (primary) hypertension: Secondary | ICD-10-CM | POA: Diagnosis not present

## 2020-11-23 DIAGNOSIS — E782 Mixed hyperlipidemia: Secondary | ICD-10-CM | POA: Diagnosis not present

## 2020-11-23 DIAGNOSIS — I10 Essential (primary) hypertension: Secondary | ICD-10-CM | POA: Diagnosis not present

## 2020-11-23 DIAGNOSIS — E039 Hypothyroidism, unspecified: Secondary | ICD-10-CM | POA: Diagnosis not present

## 2021-01-21 DIAGNOSIS — E782 Mixed hyperlipidemia: Secondary | ICD-10-CM | POA: Diagnosis not present

## 2021-01-21 DIAGNOSIS — I10 Essential (primary) hypertension: Secondary | ICD-10-CM | POA: Diagnosis not present

## 2021-01-21 DIAGNOSIS — K219 Gastro-esophageal reflux disease without esophagitis: Secondary | ICD-10-CM | POA: Diagnosis not present

## 2021-01-21 DIAGNOSIS — E039 Hypothyroidism, unspecified: Secondary | ICD-10-CM | POA: Diagnosis not present

## 2021-02-13 DIAGNOSIS — I10 Essential (primary) hypertension: Secondary | ICD-10-CM | POA: Diagnosis not present

## 2021-02-13 DIAGNOSIS — R7301 Impaired fasting glucose: Secondary | ICD-10-CM | POA: Diagnosis not present

## 2021-02-13 DIAGNOSIS — Z7983 Long term (current) use of bisphosphonates: Secondary | ICD-10-CM | POA: Diagnosis not present

## 2021-02-15 DIAGNOSIS — M810 Age-related osteoporosis without current pathological fracture: Secondary | ICD-10-CM | POA: Diagnosis not present

## 2021-02-15 DIAGNOSIS — E1169 Type 2 diabetes mellitus with other specified complication: Secondary | ICD-10-CM | POA: Diagnosis not present

## 2021-02-15 DIAGNOSIS — E039 Hypothyroidism, unspecified: Secondary | ICD-10-CM | POA: Diagnosis not present

## 2021-02-15 DIAGNOSIS — E782 Mixed hyperlipidemia: Secondary | ICD-10-CM | POA: Diagnosis not present

## 2021-02-15 DIAGNOSIS — M545 Low back pain, unspecified: Secondary | ICD-10-CM | POA: Diagnosis not present

## 2021-02-15 DIAGNOSIS — I10 Essential (primary) hypertension: Secondary | ICD-10-CM | POA: Diagnosis not present

## 2021-02-20 DIAGNOSIS — R600 Localized edema: Secondary | ICD-10-CM | POA: Diagnosis not present

## 2021-02-20 DIAGNOSIS — E1169 Type 2 diabetes mellitus with other specified complication: Secondary | ICD-10-CM | POA: Diagnosis not present

## 2021-02-20 DIAGNOSIS — R7301 Impaired fasting glucose: Secondary | ICD-10-CM | POA: Diagnosis not present

## 2021-02-20 DIAGNOSIS — E782 Mixed hyperlipidemia: Secondary | ICD-10-CM | POA: Diagnosis not present

## 2021-02-20 DIAGNOSIS — Z96652 Presence of left artificial knee joint: Secondary | ICD-10-CM | POA: Diagnosis not present

## 2021-02-20 DIAGNOSIS — I10 Essential (primary) hypertension: Secondary | ICD-10-CM | POA: Diagnosis not present

## 2021-02-20 DIAGNOSIS — M1712 Unilateral primary osteoarthritis, left knee: Secondary | ICD-10-CM | POA: Diagnosis not present

## 2021-02-20 DIAGNOSIS — R0602 Shortness of breath: Secondary | ICD-10-CM | POA: Diagnosis not present

## 2021-04-17 ENCOUNTER — Other Ambulatory Visit: Payer: Self-pay

## 2021-04-17 ENCOUNTER — Ambulatory Visit: Payer: Medicare Other | Admitting: Orthopedic Surgery

## 2021-04-17 ENCOUNTER — Encounter: Payer: Self-pay | Admitting: Orthopedic Surgery

## 2021-04-17 ENCOUNTER — Ambulatory Visit: Payer: Medicare Other

## 2021-04-17 VITALS — BP 135/72 | HR 64 | Ht 64.0 in

## 2021-04-17 DIAGNOSIS — Z96652 Presence of left artificial knee joint: Secondary | ICD-10-CM

## 2021-04-17 DIAGNOSIS — M1712 Unilateral primary osteoarthritis, left knee: Secondary | ICD-10-CM

## 2021-04-17 NOTE — Progress Notes (Signed)
Chief Complaint  Patient presents with  . Knee Pain    S/P left knee 03/18/17.    Encounter Diagnosis  Name Primary?  . S/P TKR (total knee replacement), left 03/18/2017 Yes   4 years post op   Ms. Glenda Nolan continues to do well 4 years after her left total knee it was a Sigma posterior stabilized  She is noted to have full extension today flexion of 115 degrees she does use a cane no pain knee feels stable  X-ray again in a year

## 2021-07-10 ENCOUNTER — Other Ambulatory Visit: Payer: Self-pay

## 2021-07-10 ENCOUNTER — Ambulatory Visit (HOSPITAL_COMMUNITY)
Admission: RE | Admit: 2021-07-10 | Discharge: 2021-07-10 | Disposition: A | Discharge status: Home / Self Care | Payer: Medicare Other | Source: Ambulatory Visit | Attending: Family Medicine | Admitting: Family Medicine

## 2021-07-10 ENCOUNTER — Other Ambulatory Visit (HOSPITAL_COMMUNITY): Payer: Self-pay | Admitting: Family Medicine

## 2021-07-10 DIAGNOSIS — M545 Low back pain, unspecified: Secondary | ICD-10-CM | POA: Diagnosis not present

## 2021-08-21 DIAGNOSIS — I10 Essential (primary) hypertension: Secondary | ICD-10-CM | POA: Diagnosis not present

## 2021-08-21 DIAGNOSIS — E039 Hypothyroidism, unspecified: Secondary | ICD-10-CM | POA: Diagnosis not present

## 2021-08-21 DIAGNOSIS — R7301 Impaired fasting glucose: Secondary | ICD-10-CM | POA: Diagnosis not present

## 2021-08-30 DIAGNOSIS — Z23 Encounter for immunization: Secondary | ICD-10-CM | POA: Diagnosis not present

## 2021-08-30 DIAGNOSIS — E782 Mixed hyperlipidemia: Secondary | ICD-10-CM | POA: Diagnosis not present

## 2021-08-30 DIAGNOSIS — M810 Age-related osteoporosis without current pathological fracture: Secondary | ICD-10-CM | POA: Diagnosis not present

## 2021-08-30 DIAGNOSIS — E1169 Type 2 diabetes mellitus with other specified complication: Secondary | ICD-10-CM | POA: Diagnosis not present

## 2021-08-30 DIAGNOSIS — K219 Gastro-esophageal reflux disease without esophagitis: Secondary | ICD-10-CM | POA: Diagnosis not present

## 2021-08-30 DIAGNOSIS — I10 Essential (primary) hypertension: Secondary | ICD-10-CM | POA: Diagnosis not present

## 2021-08-30 DIAGNOSIS — E039 Hypothyroidism, unspecified: Secondary | ICD-10-CM | POA: Diagnosis not present

## 2021-08-30 DIAGNOSIS — M545 Low back pain, unspecified: Secondary | ICD-10-CM | POA: Diagnosis not present

## 2022-02-24 DIAGNOSIS — E039 Hypothyroidism, unspecified: Secondary | ICD-10-CM | POA: Diagnosis not present

## 2022-02-24 DIAGNOSIS — I10 Essential (primary) hypertension: Secondary | ICD-10-CM | POA: Diagnosis not present

## 2022-02-24 DIAGNOSIS — E1169 Type 2 diabetes mellitus with other specified complication: Secondary | ICD-10-CM | POA: Diagnosis not present

## 2022-02-26 DIAGNOSIS — M545 Low back pain, unspecified: Secondary | ICD-10-CM | POA: Diagnosis not present

## 2022-02-26 DIAGNOSIS — K219 Gastro-esophageal reflux disease without esophagitis: Secondary | ICD-10-CM | POA: Diagnosis not present

## 2022-02-26 DIAGNOSIS — E039 Hypothyroidism, unspecified: Secondary | ICD-10-CM | POA: Diagnosis not present

## 2022-02-26 DIAGNOSIS — M810 Age-related osteoporosis without current pathological fracture: Secondary | ICD-10-CM | POA: Diagnosis not present

## 2022-02-26 DIAGNOSIS — I10 Essential (primary) hypertension: Secondary | ICD-10-CM | POA: Diagnosis not present

## 2022-02-26 DIAGNOSIS — E782 Mixed hyperlipidemia: Secondary | ICD-10-CM | POA: Diagnosis not present

## 2022-02-26 DIAGNOSIS — E1169 Type 2 diabetes mellitus with other specified complication: Secondary | ICD-10-CM | POA: Diagnosis not present

## 2022-04-14 ENCOUNTER — Ambulatory Visit (INDEPENDENT_AMBULATORY_CARE_PROVIDER_SITE_OTHER): Payer: Medicare Other

## 2022-04-14 ENCOUNTER — Ambulatory Visit (INDEPENDENT_AMBULATORY_CARE_PROVIDER_SITE_OTHER): Payer: Medicare Other | Admitting: Orthopedic Surgery

## 2022-04-14 DIAGNOSIS — M1710 Unilateral primary osteoarthritis, unspecified knee: Secondary | ICD-10-CM

## 2022-04-14 DIAGNOSIS — Z96652 Presence of left artificial knee joint: Secondary | ICD-10-CM

## 2022-04-14 NOTE — Progress Notes (Signed)
Chief Complaint  Patient presents with   Follow-up    Recheck on left knee, DOS 03-18-17.    Postop follow-up visit 5 years post left total knee with a [employer] Sigma fixed-bearing posterior stabilized knee  No complaints  Her range of motion is approximate 125 degrees  Her x-ray looks good  Follow-up in 2 years  Chronic stable, low risk

## 2022-04-17 ENCOUNTER — Ambulatory Visit: Payer: Medicare Other | Admitting: Orthopedic Surgery

## 2022-05-30 DIAGNOSIS — R269 Unspecified abnormalities of gait and mobility: Secondary | ICD-10-CM | POA: Diagnosis not present

## 2022-05-30 DIAGNOSIS — G309 Alzheimer's disease, unspecified: Secondary | ICD-10-CM | POA: Diagnosis not present

## 2022-07-02 DIAGNOSIS — G309 Alzheimer's disease, unspecified: Secondary | ICD-10-CM | POA: Diagnosis not present

## 2022-07-02 DIAGNOSIS — R269 Unspecified abnormalities of gait and mobility: Secondary | ICD-10-CM | POA: Diagnosis not present

## 2022-07-22 ENCOUNTER — Emergency Department (HOSPITAL_COMMUNITY)
Admission: EM | Admit: 2022-07-22 | Discharge: 2022-07-22 | Disposition: A | Discharge status: Home / Self Care | Payer: Medicare Other | Attending: Emergency Medicine | Admitting: Emergency Medicine

## 2022-07-22 ENCOUNTER — Other Ambulatory Visit: Payer: Self-pay

## 2022-07-22 ENCOUNTER — Emergency Department (HOSPITAL_COMMUNITY): Payer: Medicare Other

## 2022-07-22 ENCOUNTER — Encounter (HOSPITAL_COMMUNITY): Payer: Self-pay | Admitting: Emergency Medicine

## 2022-07-22 DIAGNOSIS — R748 Abnormal levels of other serum enzymes: Secondary | ICD-10-CM | POA: Diagnosis not present

## 2022-07-22 DIAGNOSIS — Y92002 Bathroom of unspecified non-institutional (private) residence single-family (private) house as the place of occurrence of the external cause: Secondary | ICD-10-CM | POA: Insufficient documentation

## 2022-07-22 DIAGNOSIS — I6523 Occlusion and stenosis of bilateral carotid arteries: Secondary | ICD-10-CM | POA: Diagnosis not present

## 2022-07-22 DIAGNOSIS — Z7982 Long term (current) use of aspirin: Secondary | ICD-10-CM | POA: Diagnosis not present

## 2022-07-22 DIAGNOSIS — Z043 Encounter for examination and observation following other accident: Secondary | ICD-10-CM | POA: Diagnosis not present

## 2022-07-22 DIAGNOSIS — M25561 Pain in right knee: Secondary | ICD-10-CM | POA: Diagnosis not present

## 2022-07-22 DIAGNOSIS — R5381 Other malaise: Secondary | ICD-10-CM | POA: Diagnosis not present

## 2022-07-22 DIAGNOSIS — S199XXA Unspecified injury of neck, initial encounter: Secondary | ICD-10-CM | POA: Diagnosis not present

## 2022-07-22 DIAGNOSIS — F0390 Unspecified dementia without behavioral disturbance: Secondary | ICD-10-CM | POA: Insufficient documentation

## 2022-07-22 DIAGNOSIS — S0990XA Unspecified injury of head, initial encounter: Secondary | ICD-10-CM | POA: Diagnosis not present

## 2022-07-22 DIAGNOSIS — M47812 Spondylosis without myelopathy or radiculopathy, cervical region: Secondary | ICD-10-CM | POA: Diagnosis not present

## 2022-07-22 DIAGNOSIS — W1839XA Other fall on same level, initial encounter: Secondary | ICD-10-CM | POA: Diagnosis not present

## 2022-07-22 DIAGNOSIS — W19XXXA Unspecified fall, initial encounter: Secondary | ICD-10-CM

## 2022-07-22 DIAGNOSIS — R69 Illness, unspecified: Secondary | ICD-10-CM | POA: Diagnosis not present

## 2022-07-22 DIAGNOSIS — M4312 Spondylolisthesis, cervical region: Secondary | ICD-10-CM | POA: Diagnosis not present

## 2022-07-22 LAB — CBC WITH DIFFERENTIAL/PLATELET
Abs Immature Granulocytes: 0.05 10*3/uL (ref 0.00–0.07)
Basophils Absolute: 0.1 10*3/uL (ref 0.0–0.1)
Basophils Relative: 0 %
Eosinophils Absolute: 0.1 10*3/uL (ref 0.0–0.5)
Eosinophils Relative: 1 %
HCT: 42.3 % (ref 36.0–46.0)
Hemoglobin: 13.8 g/dL (ref 12.0–15.0)
Immature Granulocytes: 0 %
Lymphocytes Relative: 18 %
Lymphs Abs: 2.1 10*3/uL (ref 0.7–4.0)
MCH: 30.4 pg (ref 26.0–34.0)
MCHC: 32.6 g/dL (ref 30.0–36.0)
MCV: 93.2 fL (ref 80.0–100.0)
Monocytes Absolute: 0.9 10*3/uL (ref 0.1–1.0)
Monocytes Relative: 8 %
Neutro Abs: 8.3 10*3/uL — ABNORMAL HIGH (ref 1.7–7.7)
Neutrophils Relative %: 73 %
Platelets: 298 10*3/uL (ref 150–400)
RBC: 4.54 MIL/uL (ref 3.87–5.11)
RDW: 13.2 % (ref 11.5–15.5)
WBC: 11.4 10*3/uL — ABNORMAL HIGH (ref 4.0–10.5)
nRBC: 0 % (ref 0.0–0.2)

## 2022-07-22 LAB — COMPREHENSIVE METABOLIC PANEL
ALT: 26 U/L (ref 0–44)
AST: 37 U/L (ref 15–41)
Albumin: 4 g/dL (ref 3.5–5.0)
Alkaline Phosphatase: 66 U/L (ref 38–126)
Anion gap: 15 (ref 5–15)
BUN: 11 mg/dL (ref 8–23)
CO2: 25 mmol/L (ref 22–32)
Calcium: 9.3 mg/dL (ref 8.9–10.3)
Chloride: 99 mmol/L (ref 98–111)
Creatinine, Ser: 0.82 mg/dL (ref 0.44–1.00)
GFR, Estimated: >60 mL/min (ref >60)
Glucose, Bld: 152 mg/dL — ABNORMAL HIGH (ref 70–99)
Potassium: 3.5 mmol/L (ref 3.5–5.1)
Sodium: 139 mmol/L (ref 135–145)
Total Bilirubin: 1.4 mg/dL — ABNORMAL HIGH (ref 0.3–1.2)
Total Protein: 7.8 g/dL (ref 6.5–8.1)

## 2022-07-22 LAB — CK: Total CK: 726 U/L — ABNORMAL HIGH (ref 38–234)

## 2022-07-22 LAB — PHOSPHORUS: Phosphorus: 3.9 mg/dL (ref 2.5–4.6)

## 2022-07-22 LAB — MAGNESIUM: Magnesium: 2.5 mg/dL — ABNORMAL HIGH (ref 1.7–2.4)

## 2022-07-22 NOTE — ED Provider Notes (Signed)
[facility] EMERGENCY DEPARTMENT Provider Note   CSN: [id number] Arrival date & time: 07/22/22  1401     History Chief Complaint  Patient presents with   Glenda Nolan is a 87 y.o. female patient with history of dementia who presents to the emergency department after mechanical fall.  Family is at bedside and provides most of the history.  They state that they last talked to her around 3 PM yesterday and when they went to visit her today around noon they found her on the floor.  Patient unable to answer when she fell.  They found her in the bathroom with her head underneath the toilet.  Patient has no physical somatic complaints at this time.   Fall       Home Medications Prior to Admission medications   Medication Sig Start Date End Date Taking? Authorizing Provider  acetaminophen (TYLENOL) 325 MG tablet Take 325 mg by mouth daily after lunch.     [provider]  alendronate (FOSAMAX) 70 MG tablet Take 70 mg by mouth every Monday.  11/21/16   [provider]  Artificial Tear Solution (SOOTHE XP) SOLN Place 1 drop into both eyes 2 (two) times daily as needed (for dry eyes).    [provider]  aspirin EC 325 MG EC tablet Take 1 tablet (325 mg total) by mouth daily with breakfast. 03/21/17   Carole Civil, MD  cholecalciferol (VITAMIN D) 1000 UNITS tablet Take 1,000 Units by mouth daily after lunch.     [provider]  docusate sodium (COLACE) 100 MG capsule Take 1 capsule (100 mg total) by mouth 2 (two) times daily. 03/20/17   Carole Civil, MD  donepezil (ARICEPT) 5 MG tablet Take 5 mg by mouth at bedtime. 02/09/20   [provider]  fenoprofen (NALFON) 600 MG TABS tablet Take 600 mg by mouth.    [provider]  furosemide (LASIX) 20 MG tablet Take 40 mg by mouth daily.  04/06/19   [provider]  IRON PO Take 1 tablet by mouth daily after lunch.     [provider]  levothyroxine  (SYNTHROID) 75 MCG tablet Take 75 mcg by mouth every morning. 02/11/21   [provider]  metFORMIN (GLUCOPHAGE) 500 MG tablet Take 1 tablet by mouth daily. 04/10/21   [provider]  metoprolol tartrate (LOPRESSOR) 100 MG tablet TAKE (1) TABLET BY MOUTHDAT BREAKFAST THEN TAKE (1) TABLET BY MOUTH AT BEDTIME FOR HEART RATE. 11/29/19   [provider]  Multiple Vitamins-Minerals (PX SENIOR VITAMIN PO) Take 1 tablet by mouth daily after lunch.     [provider]  omeprazole (PRILOSEC) 20 MG capsule Take 20 mg by mouth daily after breakfast. (0800)    [provider]  Potassium 99 MG TABS Take by mouth.    [provider]  potassium chloride (MICRO-K) 10 MEQ CR capsule Take 10 mEq by mouth daily. 04/10/21   [provider]  raloxifene (EVISTA) 60 MG tablet Take 60 mg by mouth daily after breakfast. (0800)    [provider]  simvastatin (ZOCOR) 40 MG tablet Take 40 mg by mouth at bedtime.     [provider]  telmisartan (MICARDIS) 80 MG tablet Take 80 mg by mouth daily.  12/12/17   [provider]      Allergies    Patient has no known allergies.    Review of Systems   Review of Systems  All other systems reviewed and are negative.   Physical Exam Updated Vital Signs BP (!) 171/86   Pulse (!) 104   Temp 98.5 F (36.9 C)   Resp 15   Ht '5\' 4"'$  (1.626 m)   Wt 66.2 kg   SpO2 97%   BMI 25.05 kg/m  Physical Exam Vitals and nursing note reviewed.  Constitutional:      General: She is not in acute distress.    Appearance: Normal appearance.  HENT:     Head: Normocephalic and atraumatic.  Eyes:     General:        Right eye: No discharge.        Left eye: No discharge.  Cardiovascular:     Comments: Regular rate and rhythm.  S1/S2 are distinct without any evidence of murmur, rubs, or gallops.  Radial pulses are 2+ bilaterally.  Dorsalis pedis pulses are 2+ bilaterally.  No evidence of pedal  edema. Pulmonary:     Comments: Clear to auscultation bilaterally.  Normal effort.  No respiratory distress.  No evidence of wheezes, rales, or rhonchi heard throughout. Chest:     Comments: Chest is stable and nontender to palpation with compression. Abdominal:     General: Abdomen is flat. Bowel sounds are normal. There is no distension.     Tenderness: There is no abdominal tenderness. There is no guarding or rebound.  Musculoskeletal:        General: Normal range of motion.     Cervical back: Neck supple.     Comments: Pelvis is stable to compression and nontender bilaterally.  Knees are nontender bilaterally.  Good posterior tibialis pulses bilaterally.  Skin:    General: Skin is warm and dry.     Findings: No rash.  Neurological:     General: No focal deficit present.     Mental Status: She is alert.  Psychiatric:        Mood and Affect: Mood normal.        Behavior: Behavior normal.     ED Results / Procedures / Treatments   Labs (all labs ordered are listed, but only abnormal results are displayed) Labs Reviewed  CBC WITH DIFFERENTIAL/PLATELET  COMPREHENSIVE METABOLIC PANEL  CK  URINALYSIS, ROUTINE W REFLEX MICROSCOPIC  MAGNESIUM  PHOSPHORUS    EKG None  Radiology No results found.  Procedures Procedures    Medications Ordered in ED Medications - No data to display  ED Course/ Medical Decision Making/ A&P Clinical Course as of 07/22/22 1858  Tue Jul 22, 2022  1854 DG Knee Complete 4 Views Right No acute findings.  I personally ordered and interpreted this study. [CF]  1857 DG Knee Complete 4 Views Left No acute findings.  I personally ordered and interpreted this study. [CF]  1857 DG Pelvis 1-2 Views No acute findings. I personally ordered and interpreted this study. [CF]  1857 CT Head Wo Contrast No acute findings.  I personally ordered and interpreted this study. [CF]  1857 CT Cervical Spine Wo Contrast No acute findings.  I personally ordered  and interpreted this study. [CF]    Clinical Course User Index [CF] Hendricks Limes, PA-C                           Medical Decision Making Glenda Nolan is a 87 y.o. female patient who presents to the emergency department today for further evaluation of mechanical fall.  I am adding labs  to evaluate for possible rhabdomyolysis as the patient was found down after an unknown amount of time.  We will also scan her head, neck and get imaging of her hips and knees.  I will plan to reassess once some of the work-up returns.  Patient is slightly hypertensive and tachycardic but resting comfortably in the ED.  Labs are still pending.  Her imaging has resulted and do not see any bony abnormalities or intracranial hemorrhages.  Due to shift change, the rest of her care will be transferred to [facility] PA-C or ultimate disposition will be made once labs result.  If her labs reveal rhabdomyolysis, patient will need to be admitted for further evaluation.  If labs are negative, patient will likely be able to go home.  Amount and/or Complexity of Data Reviewed Labs: ordered. Radiology: ordered. Decision-making details documented in ED Course.   Final Clinical Impression(s) / ED Diagnoses Final diagnoses:  None    Rx / DC Orders ED Discharge Orders     None         Hendricks Limes, [place] 07/22/22 1859    Milton Ferguson, MD 07/23/22 [phone]

## 2022-07-22 NOTE — ED Provider Notes (Signed)
Pt signed out to me at shift change from [facility] PA-C, mechanical fall occurring at an unknown time, as family last spoke with patient yesterday at 3 PM and then she was found today have an fallen in her bathroom today around noon.  Patient is unable to state when she fell secondary to dementia.  Her imaging today is negative for any acute injuries.  We are currently waiting on the labs, specifically total CK to rule out rhabdo given unknown downtime.  Family at bedside states she is at her baseline.  Daughter at bedside states they watch her closely with family members checking on her multiple times per day.  Labs have resulted, she does have a modest bump in her total CK at 726, although she has a normal creatinine and BUN, no indication for admission at this time.  Daughter was advised about this finding and the significance and need for a lab visit with her PCP within the next 2 to 3 days for recheck of her total CK to ensure it is returning to baseline.  She understands and agrees with this plan.   Evalee Jefferson, PA-C 07/22/22 2035    Milton Ferguson, MD 07/23/22 [phone]

## 2022-07-22 NOTE — ED Triage Notes (Signed)
Pt BIB for unwitness fall, pt accompanied by family, found lying on floor, unsure if she hit her head, pt has dementia.

## 2022-07-22 NOTE — ED Notes (Signed)
Pt up to the restroom X 2 assist.

## 2022-07-22 NOTE — Discharge Instructions (Signed)
As discussed, your x-rays and CT scans tonight are stable with no sign of injury from today's fall.  You do have 1 lab abnormality called a total CK which stands for creatinine kinase, this is a breakdown of muscle, probably from her lying on the floor for an extended period of time.  This lab today is 726 u/L which is elevated but not high enough to warrant admission to the hospital.  However it is important that she have this lab test repeated in 2 to 3 days.  Call Dr. Juel Burrow office tomorrow to arrange a lab visit to have this test repeated to make sure it is not continuing to worsen, hopefully it will be resolving instead.  I encourage you to drink plenty of fluids, hydrating is very important and recovering from this injury.

## 2022-07-25 DIAGNOSIS — R17 Unspecified jaundice: Secondary | ICD-10-CM | POA: Diagnosis not present

## 2022-07-25 DIAGNOSIS — W19XXXD Unspecified fall, subsequent encounter: Secondary | ICD-10-CM | POA: Diagnosis not present

## 2022-07-25 DIAGNOSIS — R748 Abnormal levels of other serum enzymes: Secondary | ICD-10-CM | POA: Diagnosis not present

## 2022-07-25 DIAGNOSIS — D72829 Elevated white blood cell count, unspecified: Secondary | ICD-10-CM | POA: Diagnosis not present

## 2022-07-25 DIAGNOSIS — R269 Unspecified abnormalities of gait and mobility: Secondary | ICD-10-CM | POA: Diagnosis not present

## 2022-07-25 DIAGNOSIS — G309 Alzheimer's disease, unspecified: Secondary | ICD-10-CM | POA: Diagnosis not present

## 2022-07-28 ENCOUNTER — Inpatient Hospital Stay (HOSPITAL_COMMUNITY)
Admission: EM | Admit: 2022-07-28 | Discharge: 2022-08-06 | DRG: 445 | Disposition: A | Discharge status: Skilled Nursing Facility | Payer: Medicare Other | Attending: Internal Medicine | Admitting: Internal Medicine

## 2022-07-28 ENCOUNTER — Other Ambulatory Visit: Payer: Self-pay

## 2022-07-28 ENCOUNTER — Emergency Department (HOSPITAL_COMMUNITY): Payer: Medicare Other

## 2022-07-28 ENCOUNTER — Encounter (HOSPITAL_COMMUNITY): Payer: Self-pay

## 2022-07-28 DIAGNOSIS — Z9049 Acquired absence of other specified parts of digestive tract: Secondary | ICD-10-CM

## 2022-07-28 DIAGNOSIS — Z7401 Bed confinement status: Secondary | ICD-10-CM | POA: Diagnosis not present

## 2022-07-28 DIAGNOSIS — I700 Atherosclerosis of aorta: Secondary | ICD-10-CM | POA: Diagnosis not present

## 2022-07-28 DIAGNOSIS — K222 Esophageal obstruction: Secondary | ICD-10-CM | POA: Diagnosis not present

## 2022-07-28 DIAGNOSIS — E039 Hypothyroidism, unspecified: Secondary | ICD-10-CM | POA: Diagnosis present

## 2022-07-28 DIAGNOSIS — E876 Hypokalemia: Secondary | ICD-10-CM | POA: Diagnosis not present

## 2022-07-28 DIAGNOSIS — Z743 Need for continuous supervision: Secondary | ICD-10-CM | POA: Diagnosis not present

## 2022-07-28 DIAGNOSIS — Z96652 Presence of left artificial knee joint: Secondary | ICD-10-CM | POA: Diagnosis present

## 2022-07-28 DIAGNOSIS — K8043 Calculus of bile duct with acute cholecystitis with obstruction: Secondary | ICD-10-CM | POA: Diagnosis not present

## 2022-07-28 DIAGNOSIS — L8990 Pressure ulcer of unspecified site, unspecified stage: Secondary | ICD-10-CM | POA: Diagnosis not present

## 2022-07-28 DIAGNOSIS — Z7983 Long term (current) use of bisphosphonates: Secondary | ICD-10-CM | POA: Diagnosis not present

## 2022-07-28 DIAGNOSIS — Z8249 Family history of ischemic heart disease and other diseases of the circulatory system: Secondary | ICD-10-CM

## 2022-07-28 DIAGNOSIS — M810 Age-related osteoporosis without current pathological fracture: Secondary | ICD-10-CM | POA: Diagnosis present

## 2022-07-28 DIAGNOSIS — Z87891 Personal history of nicotine dependence: Secondary | ICD-10-CM

## 2022-07-28 DIAGNOSIS — Z7984 Long term (current) use of oral hypoglycemic drugs: Secondary | ICD-10-CM

## 2022-07-28 DIAGNOSIS — D649 Anemia, unspecified: Secondary | ICD-10-CM | POA: Diagnosis not present

## 2022-07-28 DIAGNOSIS — R404 Transient alteration of awareness: Secondary | ICD-10-CM | POA: Diagnosis not present

## 2022-07-28 DIAGNOSIS — E119 Type 2 diabetes mellitus without complications: Secondary | ICD-10-CM | POA: Diagnosis not present

## 2022-07-28 DIAGNOSIS — K8050 Calculus of bile duct without cholangitis or cholecystitis without obstruction: Secondary | ICD-10-CM

## 2022-07-28 DIAGNOSIS — K5710 Diverticulosis of small intestine without perforation or abscess without bleeding: Secondary | ICD-10-CM | POA: Diagnosis not present

## 2022-07-28 DIAGNOSIS — E785 Hyperlipidemia, unspecified: Secondary | ICD-10-CM | POA: Diagnosis present

## 2022-07-28 DIAGNOSIS — E11649 Type 2 diabetes mellitus with hypoglycemia without coma: Secondary | ICD-10-CM | POA: Diagnosis present

## 2022-07-28 DIAGNOSIS — I499 Cardiac arrhythmia, unspecified: Secondary | ICD-10-CM | POA: Diagnosis not present

## 2022-07-28 DIAGNOSIS — Z7982 Long term (current) use of aspirin: Secondary | ICD-10-CM

## 2022-07-28 DIAGNOSIS — R197 Diarrhea, unspecified: Secondary | ICD-10-CM | POA: Diagnosis not present

## 2022-07-28 DIAGNOSIS — R52 Pain, unspecified: Secondary | ICD-10-CM | POA: Diagnosis not present

## 2022-07-28 DIAGNOSIS — D72829 Elevated white blood cell count, unspecified: Secondary | ICD-10-CM | POA: Diagnosis not present

## 2022-07-28 DIAGNOSIS — F0390 Unspecified dementia without behavioral disturbance: Secondary | ICD-10-CM | POA: Diagnosis present

## 2022-07-28 DIAGNOSIS — R1011 Right upper quadrant pain: Secondary | ICD-10-CM | POA: Diagnosis not present

## 2022-07-28 DIAGNOSIS — Z79890 Hormone replacement therapy (postmenopausal): Secondary | ICD-10-CM

## 2022-07-28 DIAGNOSIS — L304 Erythema intertrigo: Secondary | ICD-10-CM | POA: Diagnosis present

## 2022-07-28 DIAGNOSIS — Z79899 Other long term (current) drug therapy: Secondary | ICD-10-CM | POA: Diagnosis not present

## 2022-07-28 DIAGNOSIS — N179 Acute kidney failure, unspecified: Secondary | ICD-10-CM | POA: Diagnosis not present

## 2022-07-28 DIAGNOSIS — D638 Anemia in other chronic diseases classified elsewhere: Secondary | ICD-10-CM | POA: Diagnosis not present

## 2022-07-28 DIAGNOSIS — K219 Gastro-esophageal reflux disease without esophagitis: Secondary | ICD-10-CM | POA: Diagnosis not present

## 2022-07-28 DIAGNOSIS — K838 Other specified diseases of biliary tract: Secondary | ICD-10-CM | POA: Diagnosis not present

## 2022-07-28 DIAGNOSIS — R509 Fever, unspecified: Secondary | ICD-10-CM | POA: Diagnosis not present

## 2022-07-28 DIAGNOSIS — K8020 Calculus of gallbladder without cholecystitis without obstruction: Secondary | ICD-10-CM | POA: Diagnosis not present

## 2022-07-28 DIAGNOSIS — R109 Unspecified abdominal pain: Secondary | ICD-10-CM | POA: Diagnosis not present

## 2022-07-28 DIAGNOSIS — Z85038 Personal history of other malignant neoplasm of large intestine: Secondary | ICD-10-CM

## 2022-07-28 DIAGNOSIS — I10 Essential (primary) hypertension: Secondary | ICD-10-CM | POA: Diagnosis present

## 2022-07-28 DIAGNOSIS — E1169 Type 2 diabetes mellitus with other specified complication: Secondary | ICD-10-CM | POA: Diagnosis not present

## 2022-07-28 DIAGNOSIS — F03911 Unspecified dementia, unspecified severity, with agitation: Secondary | ICD-10-CM | POA: Diagnosis not present

## 2022-07-28 DIAGNOSIS — K8051 Calculus of bile duct without cholangitis or cholecystitis with obstruction: Secondary | ICD-10-CM | POA: Diagnosis not present

## 2022-07-28 LAB — CBC WITH DIFFERENTIAL/PLATELET
Abs Immature Granulocytes: 1.05 10*3/uL — ABNORMAL HIGH (ref 0.00–0.07)
Basophils Absolute: 0.1 10*3/uL (ref 0.0–0.1)
Basophils Relative: 0 %
Eosinophils Absolute: 0 10*3/uL (ref 0.0–0.5)
Eosinophils Relative: 0 %
HCT: 34.5 % — ABNORMAL LOW (ref 36.0–46.0)
Hemoglobin: 11.5 g/dL — ABNORMAL LOW (ref 12.0–15.0)
Immature Granulocytes: 4 %
Lymphocytes Relative: 6 %
Lymphs Abs: 1.6 10*3/uL (ref 0.7–4.0)
MCH: 30.6 pg (ref 26.0–34.0)
MCHC: 33.3 g/dL (ref 30.0–36.0)
MCV: 91.8 fL (ref 80.0–100.0)
Monocytes Absolute: 1.1 10*3/uL — ABNORMAL HIGH (ref 0.1–1.0)
Monocytes Relative: 4 %
Neutro Abs: 24.2 10*3/uL — ABNORMAL HIGH (ref 1.7–7.7)
Neutrophils Relative %: 86 %
Platelets: 174 10*3/uL (ref 150–400)
RBC: 3.76 MIL/uL — ABNORMAL LOW (ref 3.87–5.11)
RDW: 13.3 % (ref 11.5–15.5)
WBC: 28 10*3/uL — ABNORMAL HIGH (ref 4.0–10.5)
nRBC: 0 % (ref 0.0–0.2)

## 2022-07-28 LAB — COMPREHENSIVE METABOLIC PANEL
ALT: 60 U/L — ABNORMAL HIGH (ref 0–44)
AST: 52 U/L — ABNORMAL HIGH (ref 15–41)
Albumin: 3.1 g/dL — ABNORMAL LOW (ref 3.5–5.0)
Alkaline Phosphatase: 142 U/L — ABNORMAL HIGH (ref 38–126)
Anion gap: 12 (ref 5–15)
BUN: 32 mg/dL — ABNORMAL HIGH (ref 8–23)
CO2: 26 mmol/L (ref 22–32)
Calcium: 7.9 mg/dL — ABNORMAL LOW (ref 8.9–10.3)
Chloride: 106 mmol/L (ref 98–111)
Creatinine, Ser: 2.44 mg/dL — ABNORMAL HIGH (ref 0.44–1.00)
GFR, Estimated: 19 mL/min — ABNORMAL LOW (ref >60)
Glucose, Bld: 117 mg/dL — ABNORMAL HIGH (ref 70–99)
Potassium: 3.4 mmol/L — ABNORMAL LOW (ref 3.5–5.1)
Sodium: 144 mmol/L (ref 135–145)
Total Bilirubin: 3.1 mg/dL — ABNORMAL HIGH (ref 0.3–1.2)
Total Protein: 6.9 g/dL (ref 6.5–8.1)

## 2022-07-28 LAB — LACTIC ACID, PLASMA: Lactic Acid, Venous: 1.3 mmol/L (ref 0.5–1.9)

## 2022-07-28 LAB — LIPASE, BLOOD: Lipase: 20 U/L (ref 11–51)

## 2022-07-28 LAB — MAGNESIUM: Magnesium: 2.3 mg/dL (ref 1.7–2.4)

## 2022-07-28 MED ORDER — SODIUM CHLORIDE 0.9 % IV SOLN: INTRAVENOUS | Status: DC

## 2022-07-28 MED ORDER — POTASSIUM CHLORIDE 20 MEQ PO PACK
40.0000 meq | PACK | Freq: Once | ORAL | Status: AC
  Administered 2022-07-28: 40 meq via ORAL
  Filled 2022-07-28: qty 2

## 2022-07-28 MED ORDER — PIPERACILLIN-TAZOBACTAM IN DEX 2-0.25 GM/50ML IV SOLN
2.2500 g | Freq: Three times a day (TID) | INTRAVENOUS | Status: DC
Start: 2022-07-28 — End: 2022-07-29
  Administered 2022-07-28 – 2022-07-29 (×2): 2.25 g via INTRAVENOUS
  Filled 2022-07-28 (×6): qty 50

## 2022-07-28 MED ORDER — LACTATED RINGERS IV SOLN: INTRAVENOUS | Status: DC

## 2022-07-28 MED ORDER — SODIUM CHLORIDE 0.9 % IV BOLUS
500.0000 mL | Freq: Once | INTRAVENOUS | Status: AC
  Administered 2022-07-28: 500 mL via INTRAVENOUS

## 2022-07-28 NOTE — Assessment & Plan Note (Addendum)
Peaked at 2.44 Improving Hold lasix and telmisartan

## 2022-07-28 NOTE — H&P (Signed)
History and Physical    Glenda Nolan:[id number] DOB: November 16, 1935 DOA: 07/28/2022  PCP: Celene Squibb, MD  Patient coming from: Home  I have personally briefly reviewed patient's old medical records in [place]  Chief Complaint: Diarrhea, abdominal pain  HPI: Glenda Nolan is a 87 y.o. female with medical history significant for dementia, hypertension, migraine. Patient was brought to the ED via EMS with reports of weakness, and diarrhea.  Patient lives alone.  Family reported that patient has not been eating well the past few days.  Family denies blood in stool.  Also concerned about redness on patient's sacral area, worried about bedsores.  On EMS arrival, they found patient leaning over the bed she has had a bowel movement on the floor. On my evaluation she is awake and alert, she answers some questions appropriately but is talking in such a low tones I am sometimes unable to understand her responses.  I talked to patient's daughter- Glenda Nolan on the phone, patient has history of dementia and has had more rapid decline over the past  ~ 3 months.  She gets family members confused, and mumbles a lot in response to questions, at baseline she is soft-spoken but this has significantly worsened over the past few months.  Diarrhea has been ongoing since she was in the ED 8/29 for a mechanical fall.  No vomiting.  Did report chills, low-grade fever and poor oral intake.  ED Course: Tmax 98.4.  Heart rate 85-98.  Respirate rate 16-30.  Blood pressure systolic mostly 338-250. Leukocytosis of 28.   Mild elevation in liver enzymes, AST 52, ALT 60, ALP 142.  Total bilirubin 3.1. Lipase 20 Elevated creatinine 2.4. CT abdomen and pelvis without contrast-choledocholithiasis with resultant moderate extrahepatic biliary dilatation. EDP talked to Dr. Jenetta Downer recommended calling [facility] -Dr. Lorenso Courier agrees with admission to [facility].  Review of Systems: As per HPI all other systems reviewed  and negative.  Past Medical History:  Diagnosis Date   Anemia    Arthritis    Cataracts, bilateral    pending surgery May 2013   Colon cancer [facility]) 1994   Stage IIB   Diverticulosis    GERD (gastroesophageal reflux disease)    HTN (hypertension)    Hyperlipidemia    Hypothyroidism    Migraines    occasional migraines   Osteoporosis     Past Surgical History:  Procedure Laterality Date   CATARACT EXTRACTION W/PHACO Left 08/01/2013   Procedure: CATARACT EXTRACTION PHACO AND INTRAOCULAR LENS PLACEMENT ([place]);  Surgeon: Tonny Branch, MD;  Location: [facility];  Service: Ophthalmology;  Laterality: Left;  CDE:11.47   CATARACT EXTRACTION W/PHACO Right 08/15/2013   Procedure: CATARACT EXTRACTION PHACO AND INTRAOCULAR LENS PLACEMENT ([facility]);  Surgeon: Tonny Branch, MD;  Location: [facility];  Service: Ophthalmology;  Laterality: Right;  CDE:  16.81   COLON SURGERY  1994   Right   COLONOSCOPY  04/01/2012   Rourk-Colonic ulcers at the anastomosis likely NSAID related, although ischemia is not excluded, status post biopsy, left-sided diverticula.  Internal hemorrhoids and anal papilla   COLONOSCOPY W/ BIOPSIES  05/04/09   Dr. Madolyn Frieze papilla, diverticulosis   DILATION AND CURETTAGE OF UTERUS     ESOPHAGOGASTRODUODENOSCOPY  04/01/2012   Rourk-Normal esophagus, small hiatal hernia, deformity, scarring friability of the antrum/prepyloric mucosa suggestive of prior peptic ulcer disease, normal D1 and D2.   HEMORRHOID SURGERY     knee arthrocopy(Right)     TOTAL KNEE ARTHROPLASTY Left 03/18/2017  Procedure: TOTAL KNEE ARTHROPLASTY;  Surgeon: Carole Civil, MD;  Location: [facility];  Service: Orthopedics;  Laterality: Left;     reports that she quit smoking about 29 years ago. Her smoking use included cigarettes. She has a 5.00 pack-year smoking history. She quit smokeless tobacco use about 29 years ago. She reports current alcohol use. She reports that she does not use drugs.  No Known  Allergies  Family History  Problem Relation Age of Onset   Leukemia Mother 60   Hypertension Mother    Heart failure Father 45   Coronary artery disease Sister        x2   Stroke Son     Prior to Admission medications   Medication Sig Start Date End Date Taking? Authorizing Provider  acetaminophen (TYLENOL) 325 MG tablet Take 325 mg by mouth daily after lunch.    Yes [provider]  alendronate (FOSAMAX) 70 MG tablet Take 70 mg by mouth every Monday.  11/21/16  Yes [provider]  aspirin EC 325 MG EC tablet Take 1 tablet (325 mg total) by mouth daily with breakfast. 03/21/17  Yes Carole Civil, MD  cholecalciferol (VITAMIN D) 1000 UNITS tablet Take 1,000 Units by mouth daily after lunch.    Yes [provider]  donepezil (ARICEPT) 5 MG tablet Take 10 mg by mouth at bedtime. 02/09/20  Yes [provider]  furosemide (LASIX) 20 MG tablet Take 40 mg by mouth daily.  04/06/19  Yes [provider]  levothyroxine (SYNTHROID) 75 MCG tablet Take 75 mcg by mouth every morning. 02/11/21  Yes [provider]  metFORMIN (GLUCOPHAGE) 500 MG tablet Take 1 tablet by mouth daily. 04/10/21  Yes [provider]  metoprolol tartrate (LOPRESSOR) 100 MG tablet TAKE (1) TABLET BY MOUTHDAT BREAKFAST THEN TAKE (1) TABLET BY MOUTH AT BEDTIME FOR HEART RATE. 11/29/19  Yes [provider]  Multiple Vitamins-Minerals (PX SENIOR VITAMIN PO) Take 1 tablet by mouth daily after lunch.    Yes [provider]  omeprazole (PRILOSEC) 20 MG capsule Take 20 mg by mouth daily after breakfast. (0800)   Yes [provider]  potassium chloride (MICRO-K) 10 MEQ CR capsule Take 10 mEq by mouth daily. 04/10/21  Yes [provider]  raloxifene (EVISTA) 60 MG tablet Take 60 mg by mouth daily after breakfast. (0800)   Yes [provider]  simvastatin (ZOCOR) 40 MG tablet Take 40 mg by mouth at bedtime.    Yes [provider]  telmisartan (MICARDIS) 80 MG tablet Take 80 mg by mouth daily.  12/12/17  Yes [provider]  docusate sodium (COLACE) 100 MG capsule Take 1 capsule (100 mg total) by mouth 2 (two) times daily. Patient not taking: Reported on 07/28/2022 03/20/17   Carole Civil, MD    Physical Exam: Vitals:   07/28/22 1600 07/28/22 1630 07/28/22 1700 07/28/22 1800  BP: 133/81 122/66 137/78 125/72  Pulse: 90 87  91  Resp: (!) 23 (!) 23  (!) 22  Temp:      TempSrc:      SpO2: 95% 95%  95%  Weight:      Height:        Constitutional: NAD, calm, comfortable Vitals:   07/28/22 1600 07/28/22 1630 07/28/22 1700 07/28/22 1800  BP: 133/81 122/66 137/78 125/72  Pulse: 90 87  91  Resp: (!) 23 (!) 23  (!) 22  Temp:      TempSrc:  SpO2: 95% 95%  95%  Weight:      Height:       Eyes: PERRL, lids and conjunctivae normal ENMT: Mucous membranes are dry.   Neck: normal, supple, no masses, no thyromegaly Respiratory: clear to auscultation bilaterally, no wheezing, no crackles.  Cardiovascular: Regular rate and rhythm, no murmurs / rubs / gallops. No extremity edema.  Extremities warm. Abdomen: no tenderness, no masses palpated. No hepatosplenomegaly.   Musculoskeletal: no clubbing / cyanosis. No joint deformity upper and lower extremities.  Skin: no rashes, lesions, ulcers. No induration Neurologic: Speech fluent, but patient is talking in low tones, no apparent cranial nerve abnormality moving extremities spontaneously.  Psychiatric: Awake, alert oriented to person and situation.  Unable to tell if she is oriented to place and time.   Labs on Admission: I have personally reviewed following labs and imaging studies  CBC: Recent Labs  Lab 07/22/22 1827 07/28/22 1556  WBC 11.4* 28.0*  NEUTROABS 8.3* 24.2*  HGB 13.8 11.5*  HCT 42.3 34.5*  MCV 93.2 91.8  PLT 298 967   Basic Metabolic Panel: Recent Labs  Lab 07/22/22 1827 07/28/22 1556  NA 139 144  K 3.5 3.4*  CL  99 106  CO2 25 26  GLUCOSE 152* 117*  BUN 11 32*  CREATININE 0.82 2.44*  CALCIUM 9.3 7.9*  MG 2.5*  --   PHOS 3.9  --    GFR: Estimated Creatinine Clearance: 15.7 mL/min (A) (by C-G formula based on SCr of 2.44 mg/dL (H)). Liver Function Tests: Recent Labs  Lab 07/22/22 1827 07/28/22 1556  AST 37 52*  ALT 26 60*  ALKPHOS 66 142*  BILITOT 1.4* 3.1*  PROT 7.8 6.9  ALBUMIN 4.0 3.1*   Recent Labs  Lab 07/28/22 1556  LIPASE 20   Cardiac Enzymes: Recent Labs  Lab 07/22/22 1827  CKTOTAL 726*    Radiological Exams on Admission: CT Abdomen Pelvis Wo Contrast  Result Date: 07/28/2022 CLINICAL DATA:  Acute generalized abdominal pain. EXAM: CT ABDOMEN AND PELVIS WITHOUT CONTRAST TECHNIQUE: Multidetector CT imaging of the abdomen and pelvis was performed following the standard protocol without IV contrast. RADIATION DOSE REDUCTION: This exam was performed according to the departmental dose-optimization program which includes automated exposure control, adjustment of the mA and/or kV according to patient size and/or use of iterative reconstruction technique. COMPARISON:  July 12, 2022. FINDINGS: Lower chest: No acute abnormality. Hepatobiliary: Liver is unremarkable on these unenhanced images. Minimal cholelithiasis is noted. There appears to be multiple stones in the distal common bile duct resulting in moderate extrahepatic biliary dilatation. Pancreas: Unremarkable. No pancreatic ductal dilatation or surrounding inflammatory changes. Spleen: Normal in size without focal abnormality. Adrenals/Urinary Tract: Adrenal glands are unremarkable. Kidneys are normal, without renal calculi, focal lesion, or hydronephrosis. Bladder is unremarkable. Stomach/Bowel: The stomach is unremarkable. Status post right colectomy. There is no abnormal bowel dilatation or inflammation. Sigmoid diverticulosis is noted without inflammation. Vascular/Lymphatic: Aortic atherosclerosis. No enlarged abdominal or  pelvic lymph nodes. Reproductive: Uterus and bilateral adnexa are unremarkable. Other: No abdominal wall hernia or abnormality. No abdominopelvic ascites. Musculoskeletal: No acute or significant osseous findings. IMPRESSION: Choledocholithiasis is noted resulting in moderate extrahepatic biliary dilatation. Minimal cholelithiasis is noted. Status post right colectomy. Sigmoid diverticulosis without inflammation. Aortic Atherosclerosis (ICD10-I70.0). Electronically Signed   By: Marijo Conception M.D.   On: 07/28/2022 17:15    EKG: None.   Assessment/Plan Principal Problem:   Choledocholithiasis Active Problems:   AKI (acute kidney injury) ([place])  Essential hypertension   Dementia (HCC)   Diabetes ([place])   Hypokalemia   Assessment and Plan: * Choledocholithiasis Presents with abdominal pain, diarrhea.  Intermittent tachypnea respiratory 16-30, significant leukocytosis of 28.  Bordering on sepsis.  Lipase 20, mild liver enzyme elevation, AST 52, ALT 60, ALP 142, total bilirubin 3.1.  Abdominal CT without contrast showing choledocholithiasis with moderate extrahepatic biliary dilatation. - EDP talked to GI Dr. Lorenso Courier, agrees with admission to [facility] -Right upper quadrant ultrasound -Stool C. Difficile, GI pathogen panel -Lactic acid -Obtain blood cultures -Clear liquid diet, n.p.o. midnight -500 bolus given, continue LR 100cc/hr x 20 hrs  AKI (acute kidney injury) (HCC) Creatinine 2.44, baseline 0.6-0.8.  Likely from diarrhea, also on telmisartan and Lasix. - 500 bolus given, continue LR 100cc/hr x 20 hrs  Hypokalemia Mild, 3.4. -  Replete -check magnesium  Diabetes (HCC) - Hgba1c -Hold metformin  - SSi- S q6h    Dementia ([place]) Over the past 3 months, patient has had more rapid decline, per daughter Glenda Nolan.  Gets family members confused, has been significantly more soft-spoken than baseline and mumble quite a bit.  She lives alone. -Resume donepezil.  Essential  hypertension Stable. -Hold telmisartan and Lasix for now with AKI -Resume metoprolol   DVT prophylaxis: Heparin  Code Status:  Full-confirmed with Daughter Glenda Nolan on the phone. Family Communication: None at bedside, talked to patient's daughter Glenda Nolan on the phone.  Patient has no designated healthcare power of attorney.  Daughter Glenda Nolan is primary Media planner. Disposition Plan: ~/> 2 days Consults called: GI Admission status: INPT, tele I certify that at the point of admission it is my clinical judgment that the patient will require inpatient hospital care spanning beyond 2 midnights from the point of admission due to high intensity of service, high risk for further deterioration and high frequency of surveillance required.   Author: Bethena Roys, MD 07/28/2022 9:01 PM  For on call review [email or web address].

## 2022-07-28 NOTE — ED Notes (Signed)
Pt found walking in hallway, IV and monitoring pulled off, blood dripping onto floor. Assisted back into bed, monitoring reapplied, reoriented to environment. Will plan to move closer to nurses' station.

## 2022-07-28 NOTE — Assessment & Plan Note (Signed)
Stable. -Hold telmisartan and Lasix for now with AKI -Resume metoprolol

## 2022-07-28 NOTE — ED Notes (Signed)
Bladder scanned the pt, the most it showed was 111.

## 2022-07-28 NOTE — Assessment & Plan Note (Addendum)
Hgba1c 6.8 Hold metformin SSi- S q6h

## 2022-07-28 NOTE — Assessment & Plan Note (Addendum)
CT abd/pelvis with choledocholithiasis resulting in moderate extrahepatic biliary dilatation, minimal cholelithiasis RUQ [place] with cholelithiasis without cholecystitis Intra and extrahepatic biliary ductal dilatation concerning for choledocholithiasis  LFT's overall improved WBC 28 on presentation, improving Continue antibiotics Appreciate GI, s/p ERCP with biliary sphicterotomy and balloon extraction, distal esophageal stricture, periampullatory diverticulum.  Recommending avoiding aspirin, NSAIDs x2 weeks, abx x7 days for cholangitis.   General surgery c/s, risks thought to outweigh benefit Lives home alone, walks without cane/walker in home (though she has them and is supposed to use them).  Dresses herself, needs help with bathing.  No hx MI or CVA or HF.

## 2022-07-28 NOTE — ED Provider Notes (Signed)
[facility] EMERGENCY DEPARTMENT Provider Note   CSN: [id number] Arrival date & time: 07/28/22  1134     History  Chief Complaint  Patient presents with   Diarrhea    Glenda Nolan is a 87 y.o. female.  Patient seen on August 29 here in this emergency department following a fall.  Is been no falls since that time.  Family is saying that her oral intake has not been very good but she seems to be a little more weak than usual generalized weakness and that she has had some diarrhea but they cannot quantify the amount diarrhea but and no blood in it because they have seen some of it.  Patient has a history of dementia.  No strokelike symptoms.  All arms and legs are moving fine.  Patient also complained with some generalized abdominal discomfort and family members are worried about some redness around her buttocks area and they were worried about may be the development of bedsores.  Patient is not on any antibiotics.  Past medical history significant for hypertension history of dementia hypothyroidism hyperlipidemia diverticulosis and history of migraines.  Patient is a former smoker quit in 1994.       Home Medications Prior to Admission medications   Medication Sig Start Date End Date Taking? Authorizing Provider  acetaminophen (TYLENOL) 325 MG tablet Take 325 mg by mouth daily after lunch.    Yes [provider]  alendronate (FOSAMAX) 70 MG tablet Take 70 mg by mouth every Monday.  11/21/16  Yes [provider]  aspirin EC 325 MG EC tablet Take 1 tablet (325 mg total) by mouth daily with breakfast. 03/21/17  Yes Carole Civil, MD  cholecalciferol (VITAMIN D) 1000 UNITS tablet Take 1,000 Units by mouth daily after lunch.    Yes [provider]  donepezil (ARICEPT) 5 MG tablet Take 10 mg by mouth at bedtime. 02/09/20  Yes [provider]  furosemide (LASIX) 20 MG tablet Take 40 mg by mouth daily.  04/06/19  Yes [provider]   levothyroxine (SYNTHROID) 75 MCG tablet Take 75 mcg by mouth every morning. 02/11/21  Yes [provider]  metFORMIN (GLUCOPHAGE) 500 MG tablet Take 1 tablet by mouth daily. 04/10/21  Yes [provider]  metoprolol tartrate (LOPRESSOR) 100 MG tablet TAKE (1) TABLET BY MOUTHDAT BREAKFAST THEN TAKE (1) TABLET BY MOUTH AT BEDTIME FOR HEART RATE. 11/29/19  Yes [provider]  Multiple Vitamins-Minerals (PX SENIOR VITAMIN PO) Take 1 tablet by mouth daily after lunch.    Yes [provider]  omeprazole (PRILOSEC) 20 MG capsule Take 20 mg by mouth daily after breakfast. (0800)   Yes [provider]  potassium chloride (MICRO-K) 10 MEQ CR capsule Take 10 mEq by mouth daily. 04/10/21  Yes [provider]  raloxifene (EVISTA) 60 MG tablet Take 60 mg by mouth daily after breakfast. (0800)   Yes [provider]  simvastatin (ZOCOR) 40 MG tablet Take 40 mg by mouth at bedtime.    Yes [provider]  telmisartan (MICARDIS) 80 MG tablet Take 80 mg by mouth daily.  12/12/17  Yes [provider]  docusate sodium (COLACE) 100 MG capsule Take 1 capsule (100 mg total) by mouth 2 (two) times daily. Patient not taking: Reported on 07/28/2022 03/20/17   Carole Civil, MD      Allergies    Patient has no known allergies.    Review of Systems   Review of Systems  Constitutional:  Positive for appetite change and fatigue. Negative for chills and fever.  HENT:  Negative for ear pain and sore throat.   Eyes:  Negative for pain and visual disturbance.  Respiratory:  Negative for cough and shortness of breath.   Cardiovascular:  Negative for chest pain and palpitations.  Gastrointestinal:  Positive for abdominal pain and diarrhea. Negative for blood in stool, nausea and vomiting.  Genitourinary:  Negative for dysuria and hematuria.  Musculoskeletal:  Negative for arthralgias and back pain.  Skin:  Negative for color change and rash.   Neurological:  Negative for seizures and syncope.  All other systems reviewed and are negative.   Physical Exam Updated Vital Signs BP 130/71   Pulse 89   Temp 98.4 F (36.9 C) (Oral)   Resp 16   Ht 1.626 m ('5\' 4"'$ )   Wt 65.8 kg   SpO2 98%   BMI 24.89 kg/m  Physical Exam Vitals and nursing note reviewed.  Constitutional:      General: She is not in acute distress.    Appearance: Normal appearance. She is well-developed.  HENT:     Head: Normocephalic and atraumatic.     Mouth/Throat:     Mouth: Mucous membranes are moist.  Eyes:     Extraocular Movements: Extraocular movements intact.     Conjunctiva/sclera: Conjunctivae normal.     Pupils: Pupils are equal, round, and reactive to light.  Cardiovascular:     Rate and Rhythm: Normal rate and regular rhythm.     Heart sounds: No murmur heard. Pulmonary:     Effort: Pulmonary effort is normal. No respiratory distress.     Breath sounds: Normal breath sounds.  Abdominal:     Palpations: Abdomen is soft.     Tenderness: There is abdominal tenderness. There is no guarding.     Comments: Mild diffuse tenderness no guarding.  Genitourinary:    Comments: Perianal area with some erythema no skin breakdown.  Could be due to irritation from sitting or could be irritation from the diarrhea. Musculoskeletal:        General: No swelling.     Cervical back: Normal range of motion and neck supple.  Skin:    General: Skin is warm and dry.     Capillary Refill: Capillary refill takes less than 2 seconds.  Neurological:     General: No focal deficit present.     Mental Status: She is alert and oriented to person, place, and time.     Cranial Nerves: No cranial nerve deficit.     Sensory: No sensory deficit.     Motor: No weakness.  Psychiatric:        Mood and Affect: Mood normal.     ED Results / Procedures / Treatments   Labs (all labs ordered are listed, but only abnormal results are displayed) Labs Reviewed   COMPREHENSIVE METABOLIC PANEL  LIPASE, BLOOD  CBC WITH DIFFERENTIAL/PLATELET    EKG None  Radiology No results found.  Procedures Procedures    Medications Ordered in ED Medications  0.9 %  sodium chloride infusion ( Intravenous New Bag/Given 07/28/22 1600)  sodium chloride 0.9 % bolus 500 mL (0 mLs Intravenous Stopped 07/28/22 1557)    ED Course/ Medical Decision Making/ A&P                           Medical Decision Making Amount and/or Complexity of Data Reviewed Labs: ordered. Radiology: ordered.  Risk Prescription drug management.   Patient with diffuse abdominal discomfort no guarding.  Little bit of skin erythema around the perianal area could be due to the diarrhea could be due to early skin sores.  But no skin breakdown at all.  Patient will get labs.  CT scan abdomen and pelvis.  If all those things do not lead to the need for admission then patient is stable for discharge home.   Final Clinical Impression(s) / ED Diagnoses Final diagnoses:  Diarrhea, unspecified type    Rx / DC Orders ED Discharge Orders     None         Fredia Sorrow, MD 07/28/22 1615

## 2022-07-28 NOTE — Assessment & Plan Note (Signed)
Over the past 3 months, patient has had more rapid decline, per daughter Glenda Nolan.  Gets family members confused, has been significantly more soft-spoken than baseline and mumble quite [name] bit.  She lives alone. -Resume donepezil.

## 2022-07-28 NOTE — Assessment & Plan Note (Addendum)
Replace and follow

## 2022-07-28 NOTE — ED Provider Notes (Signed)
Signout from Dr. Rogene Houston.  87 year old female with recent fall since then not taking very good p.o. generally weak AB some abdominal pain and some loose stools.  No clear fever but has been hot.  She is pending lab work and imaging. Physical Exam  BP 137/78   Pulse 87   Temp 98.4 F (36.9 C) (Oral)   Resp (!) 23   Ht '5\' 4"'$  (1.626 m)   Wt 65.8 kg   SpO2 95%   BMI 24.89 kg/m   Physical Exam  Procedures  Procedures  ED Course / MDM   Clinical Course as of 07/29/22 1112  Mon Jul 28, 2022  1739 Ultrasound not available at this time.  CT showing multiple stones in the common bile duct.  Will review with GI here although I am not sure that we have anybody available for ERCP. [MB]  1740 Discussed with GI Dr. Melida Quitter.  He said there is nobody available to do ERCP and recommends contacting [facility]. [MB]  1802 Discussed with Dr. Delila Pereyra GI.  She said that she can consult on the patient when they get to [facility] and have the patient admitted to the [facility]. [MB]    Clinical Course User Index [MB] Hayden Rasmussen, MD   Medical Decision Making Amount and/or Complexity of Data Reviewed Labs: ordered. Radiology: ordered.  Risk Prescription drug management.   CT shows cholelithiasis and choledocho cholelithiasis with ductal dilatation.  Ultrasound not available on this campus at this time.  Discussed with GI recommending contact [facility].  I updated patient's family members and have initiated IV antibiotics.       Hayden Rasmussen, MD 07/29/22 [phone]

## 2022-07-28 NOTE — ED Triage Notes (Signed)
Pt to er via ems, per ems pt is here for diarrhea and weakness.  States that they found pt leaned over the bed and had had a bm on the floor, states that pt has a hx of dementia, per ems last known well was yesterday afternoon, states that she lives alone.

## 2022-07-28 NOTE — Progress Notes (Signed)
Pharmacy Antibiotic Note  Glenda Nolan is a 87 y.o. female admitted on 07/28/2022 with  intra-abdominal infection .  Pharmacy has been consulted for zosyn dosing.  Presenting with abdominal discomfort and diarrhea. WBC 28, afebrile. In AKI with Scr 2.44 (CrCl 15 mL/min) - last Scr was 0.82 on 07/22/2022. CT showing choledocholithiasis.   Plan: Zosyn 2.25g IV every 8 hours Monitor renal fx, cx results, and clinical pic  Height: '5\' 4"'$  (162.6 cm) Weight: 65.8 kg (145 lb) IBW/kg (Calculated) : 54.7  Temp (24hrs), Avg:98.2 F (36.8 C), Min:97.9 F (36.6 C), Max:98.4 F (36.9 C)  Recent Labs  Lab 07/22/22 1827 07/28/22 1556  WBC 11.4* 28.0*  CREATININE 0.82 2.44*    Estimated Creatinine Clearance: 15.7 mL/min (A) (by C-G formula based on SCr of 2.44 mg/dL (H)).    No Known Allergies  Antimicrobials this admission: Zosyn 9/4 >>   Dose adjustments this admission: N/A  Microbiology results: 9/4 GI PCR: sent  Thank you for allowing pharmacy to be a part of this patient's care.  Antonietta Jewel, PharmD, [place] Clinical Pharmacist  Phone: [phone] 07/28/2022 5:48 PM  Please check AMION for all [facility] phone numbers After 10:00 PM, call [place] [phone]

## 2022-07-29 ENCOUNTER — Inpatient Hospital Stay (HOSPITAL_COMMUNITY): Payer: Medicare Other

## 2022-07-29 DIAGNOSIS — I10 Essential (primary) hypertension: Secondary | ICD-10-CM | POA: Diagnosis not present

## 2022-07-29 DIAGNOSIS — K8050 Calculus of bile duct without cholangitis or cholecystitis without obstruction: Secondary | ICD-10-CM | POA: Diagnosis not present

## 2022-07-29 DIAGNOSIS — N179 Acute kidney failure, unspecified: Secondary | ICD-10-CM | POA: Diagnosis not present

## 2022-07-29 DIAGNOSIS — F0390 Unspecified dementia without behavioral disturbance: Secondary | ICD-10-CM | POA: Diagnosis not present

## 2022-07-29 LAB — URINALYSIS, ROUTINE W REFLEX MICROSCOPIC
Bacteria, UA: NONE SEEN
Bilirubin Urine: NEGATIVE
Glucose, UA: NEGATIVE mg/dL
Ketones, ur: NEGATIVE mg/dL
Nitrite: NEGATIVE
Protein, ur: 100 mg/dL — AB
Specific Gravity, Urine: 1.02 (ref 1.005–1.030)
pH: 6 (ref 5.0–8.0)

## 2022-07-29 LAB — COMPREHENSIVE METABOLIC PANEL
ALT: 44 U/L (ref 0–44)
AST: 32 U/L (ref 15–41)
Albumin: 2.6 g/dL — ABNORMAL LOW (ref 3.5–5.0)
Alkaline Phosphatase: 121 U/L (ref 38–126)
Anion gap: 6 (ref 5–15)
BUN: 30 mg/dL — ABNORMAL HIGH (ref 8–23)
CO2: 25 mmol/L (ref 22–32)
Calcium: 8.1 mg/dL — ABNORMAL LOW (ref 8.9–10.3)
Chloride: 113 mmol/L — ABNORMAL HIGH (ref 98–111)
Creatinine, Ser: 1.51 mg/dL — ABNORMAL HIGH (ref 0.44–1.00)
GFR, Estimated: 34 mL/min — ABNORMAL LOW (ref >60)
Glucose, Bld: 118 mg/dL — ABNORMAL HIGH (ref 70–99)
Potassium: 3.2 mmol/L — ABNORMAL LOW (ref 3.5–5.1)
Sodium: 144 mmol/L (ref 135–145)
Total Bilirubin: 1.7 mg/dL — ABNORMAL HIGH (ref 0.3–1.2)
Total Protein: 5.9 g/dL — ABNORMAL LOW (ref 6.5–8.1)

## 2022-07-29 LAB — CBC
HCT: 32.6 % — ABNORMAL LOW (ref 36.0–46.0)
Hemoglobin: 10.7 g/dL — ABNORMAL LOW (ref 12.0–15.0)
MCH: 30.3 pg (ref 26.0–34.0)
MCHC: 32.8 g/dL (ref 30.0–36.0)
MCV: 92.4 fL (ref 80.0–100.0)
Platelets: 158 10*3/uL (ref 150–400)
RBC: 3.53 MIL/uL — ABNORMAL LOW (ref 3.87–5.11)
RDW: 13.3 % (ref 11.5–15.5)
WBC: 19.9 10*3/uL — ABNORMAL HIGH (ref 4.0–10.5)
nRBC: 0 % (ref 0.0–0.2)

## 2022-07-29 LAB — GLUCOSE, CAPILLARY
Glucose-Capillary: 81 mg/dL (ref 70–99)
Glucose-Capillary: 80 mg/dL (ref 70–99)

## 2022-07-29 LAB — CBG MONITORING, ED
Glucose-Capillary: 110 mg/dL — ABNORMAL HIGH (ref 70–99)
Glucose-Capillary: 120 mg/dL — ABNORMAL HIGH (ref 70–99)
Glucose-Capillary: 93 mg/dL (ref 70–99)

## 2022-07-29 LAB — HEMOGLOBIN A1C
Hgb A1c MFr Bld: 6.8 % — ABNORMAL HIGH (ref 4.8–5.6)
Mean Plasma Glucose: 148.46 mg/dL

## 2022-07-29 MED ORDER — HEPARIN SODIUM (PORCINE) 5000 UNIT/ML IJ SOLN
5000.0000 [IU] | Freq: Three times a day (TID) | INTRAMUSCULAR | Status: DC
  Administered 2022-07-29 – 2022-07-30 (×3): 5000 [IU] via SUBCUTANEOUS
  Filled 2022-07-29 (×3): qty 1

## 2022-07-29 MED ORDER — INSULIN ASPART 100 UNIT/ML IJ SOLN
0.0000 [IU] | Freq: Four times a day (QID) | INTRAMUSCULAR | Status: DC
  Administered 2022-07-30: 5 [IU] via SUBCUTANEOUS
  Administered 2022-07-31: 2 [IU] via SUBCUTANEOUS
  Administered 2022-08-01 – 2022-08-02 (×2): 1 [IU] via SUBCUTANEOUS
  Administered 2022-08-02: 2 [IU] via SUBCUTANEOUS
  Administered 2022-08-03 – 2022-08-05 (×4): 1 [IU] via SUBCUTANEOUS

## 2022-07-29 MED ORDER — SODIUM CHLORIDE 0.45 % IV SOLN: INTRAVENOUS | Status: DC

## 2022-07-29 MED ORDER — PIPERACILLIN-TAZOBACTAM 3.375 G IVPB
3.3750 g | Freq: Three times a day (TID) | INTRAVENOUS | Status: AC
  Administered 2022-07-29 – 2022-08-04 (×20): 3.375 g via INTRAVENOUS
  Filled 2022-07-29 (×20): qty 50

## 2022-07-29 MED ORDER — LEVOTHYROXINE SODIUM 75 MCG PO TABS
75.0000 ug | ORAL_TABLET | Freq: Every day | ORAL | Status: DC
  Administered 2022-07-29 – 2022-08-06 (×9): 75 ug via ORAL
  Filled 2022-07-29 (×6): qty 1
  Filled 2022-07-29: qty 2
  Filled 2022-07-29 (×3): qty 1

## 2022-07-29 MED ORDER — DONEPEZIL HCL 10 MG PO TABS
10.0000 mg | ORAL_TABLET | Freq: Every day | ORAL | Status: DC
  Administered 2022-07-29 – 2022-08-06 (×9): 10 mg via ORAL
  Filled 2022-07-29 (×9): qty 1

## 2022-07-29 MED ORDER — MELATONIN 5 MG PO TABS
5.0000 mg | ORAL_TABLET | Freq: Once | ORAL | Status: AC
  Administered 2022-07-29: 5 mg via ORAL
  Filled 2022-07-29: qty 1

## 2022-07-29 MED ORDER — ACETAMINOPHEN 325 MG PO TABS
650.0000 mg | ORAL_TABLET | Freq: Four times a day (QID) | ORAL | Status: DC | PRN
  Administered 2022-07-29 – 2022-07-31 (×2): 650 mg via ORAL
  Filled 2022-07-29 (×2): qty 2

## 2022-07-29 MED ORDER — LEVOTHYROXINE SODIUM 50 MCG PO TABS
75.0000 ug | ORAL_TABLET | Freq: Every morning | ORAL | Status: DC
Start: 2022-07-29 — End: 2022-07-29

## 2022-07-29 MED ORDER — METOPROLOL TARTRATE 100 MG PO TABS
100.0000 mg | ORAL_TABLET | Freq: Two times a day (BID) | ORAL | Status: DC
  Administered 2022-07-29 – 2022-08-06 (×18): 100 mg via ORAL
  Filled 2022-07-29 (×9): qty 1
  Filled 2022-07-29: qty 2
  Filled 2022-07-29 (×8): qty 1

## 2022-07-29 MED ORDER — PIPERACILLIN SOD-TAZOBACTAM SO 2.25 (2-0.25) G IV SOLR
2.2500 g | Freq: Three times a day (TID) | INTRAVENOUS | Status: DC
  Filled 2022-07-29 (×4): qty 10

## 2022-07-29 MED ORDER — POTASSIUM CHLORIDE IN NACL 40-0.9 MEQ/L-% IV SOLN
INTRAVENOUS | Status: DC
  Filled 2022-07-29 (×7): qty 1000

## 2022-07-29 MED ORDER — RALOXIFENE HCL 60 MG PO TABS
60.0000 mg | ORAL_TABLET | Freq: Every day | ORAL | Status: DC
  Administered 2022-07-29 – 2022-08-06 (×8): 60 mg via ORAL
  Filled 2022-07-29 (×9): qty 1

## 2022-07-29 MED ORDER — ACETAMINOPHEN 650 MG RE SUPP: 650.0000 mg | Freq: Four times a day (QID) | RECTAL | Status: DC | PRN

## 2022-07-29 MED ORDER — PANTOPRAZOLE SODIUM 40 MG PO TBEC
40.0000 mg | DELAYED_RELEASE_TABLET | Freq: Every day | ORAL | Status: DC
  Administered 2022-07-29 – 2022-08-06 (×9): 40 mg via ORAL
  Filled 2022-07-29 (×9): qty 1

## 2022-07-29 NOTE — TOC Progression Note (Signed)
  Transition of Care [facility]) Screening Note   Patient Details  Name: [name] Date of Birth: 1935/09/18   Transition of Care [facility]) CM/SW Contact:    Boneta Lucks, RN Phone Number: 07/29/2022, 12:06 PM  Transfer to [facility].   Transition of Care Department [facility]) has reviewed patient and no TOC needs have been identified at this time. We will continue to monitor patient advancement through interdisciplinary progression rounds. If new patient transition needs arise, please place a TOC consult.       Barriers to Discharge: Continued Medical Work up, Other (must enter comment) (Waiting on bed at [facility])

## 2022-07-29 NOTE — Progress Notes (Addendum)
Pharmacy Antibiotic Note  Glenda Nolan is a 87 y.o. female admitted on 07/28/2022 with  intra-abdominal infection .  Pharmacy has been consulted for zosyn dosing.  Presenting with abdominal discomfort and diarrhea. WBC 28, afebrile. In AKI with Scr 2.44 (CrCl 15 mL/min) - last Scr was 0.82 on 07/22/2022. CT showing choledocholithiasis.  Improvemen in AKI to 1.55, crcl 37ms/min. Adjust zosy  Plan: Zosyn 3.375g IV every 8 hours EID infusion over 4 hours Monitor renal fx, cx results, and clinical pic  Height: '5\' 4"'$  (162.6 cm) Weight: 65.8 kg (145 lb) IBW/kg (Calculated) : 54.7  Temp (24hrs), Avg:98.3 F (36.8 C), Min:97.9 F (36.6 C), Max:98.7 F (37.1 C)  Recent Labs  Lab 07/22/22 1827 07/28/22 1556 07/28/22 2122 07/29/22 0444  WBC 11.4* 28.0*  --  19.9*  CREATININE 0.82 2.44*  --  1.51*  LATICACIDVEN  --   --  1.3  --      Estimated Creatinine Clearance: 25.4 mL/min (A) (by C-G formula based on SCr of 1.51 mg/dL (H)).    No Known Allergies  Antimicrobials this admission: Zosyn 9/4 >>   Dose adjustments this admission: N/A  Microbiology results: 9/4 GI PCR: sent  Thank you for allowing pharmacy to be a part of this patient's care.  LIsac Sarna BS Pharm D, BCPS Clinical Pharmacist 07/29/2022 10:41 AM

## 2022-07-29 NOTE — Progress Notes (Signed)
PROGRESS NOTE    Glenda Nolan  WUJ:[id number] DOB: 10/28/1935 DOA: 07/28/2022 PCP: Celene Squibb, MD    Brief Narrative:  87 year old female with history of dementia, hypertension, diabetes, brought to the hospital with worsening weakness and diarrhea.  Imaging of her abdomen indicated choledocholithiasis with biliary ductal dilatation.  LFTs were noted to be elevated.  She had AKI with elevated creatinine.  Started on IV fluids and intravenous antibiotics.  Case was reviewed with GI and [facility] who agreed with [facility] evaluation   Assessment & Plan:   Principal Problem:   Choledocholithiasis Active Problems:   AKI (acute kidney injury) ([place])   Essential hypertension   Dementia ([place])   Diabetes ([place])   Hypokalemia   Choledocholithiasis -Noted on CT to show choledocholithiasis with moderate extrapelvic biliary dilatation -Bilirubin was elevated on admission -Case was reviewed by EDP with GI, Dr. Lorenso Courier who agreed with admission to [facility] for further evaluation -Overall LFTs do appear to be trending down with IV fluids  Diarrhea -Unclear etiology -Stool studies been ordered  AKI -Creatinine of 2.4 on admission -Improving with IV fluids -Hold telmisartan  Hypertension -Continued on metoprolol -Holding telmisartan  Hypokalemia -Replace -Magnesium 2.3  Diabetes mellitus, type II -Holding home metformin -Continue sliding scale insulin -A1c in process  Dementia -Continue Aricept -Family has noticed a more rapid decline in the last few months    DVT prophylaxis: heparin injection 5,000 Units Start: 07/29/22 0145  Code Status: Full code Family Communication: updated daughter [name] over the phone Disposition Plan: Status is: Inpatient Remains inpatient appropriate because: Awaiting transfer to [facility] for further GI evaluation     Consultants:  GI  Procedures:    Antimicrobials:  Zosyn 9/4 >   Subjective: She denies any  complaints at this time.  She is mildly confused.  Objective: Vitals:   07/28/22 2300 07/29/22 0000 07/29/22 0300 07/29/22 0531  BP: (!) 153/82 (!) 159/86 (!) 145/98 (!) 166/87  Pulse: 84 87 85 78  Resp: 19 (!) 27 (!) 24 16  Temp:      TempSrc:      SpO2: 97% 93% 94% 97%  Weight:      Height:        Intake/Output Summary (Last 24 hours) at 07/29/2022 0747 Last data filed at 07/29/2022 0244 Gross per 24 hour  Intake 1077.12 ml  Output --  Net 1077.12 ml   Filed Weights   07/28/22 1143  Weight: 65.8 kg    Examination:  General exam: Appears calm and comfortable  Respiratory system: Clear to auscultation. Respiratory effort normal. Cardiovascular system: S1 & S2 heard, RRR. No JVD, murmurs, rubs, gallops or clicks. No pedal edema. Gastrointestinal system: Abdomen is nondistended, soft and nontender. No organomegaly or masses felt. Normal bowel sounds heard. Central nervous system: No focal neurological deficits. Extremities: Symmetric 5 x 5 power. Skin: No rashes, lesions or ulcers Psychiatry: pleasant, confused    Data Reviewed: I have personally reviewed following labs and imaging studies  CBC: Recent Labs  Lab 07/22/22 1827 07/28/22 1556 07/29/22 0444  WBC 11.4* 28.0* 19.9*  NEUTROABS 8.3* 24.2*  --   HGB 13.8 11.5* 10.7*  HCT 42.3 34.5* 32.6*  MCV 93.2 91.8 92.4  PLT 298 174 956   Basic Metabolic Panel: Recent Labs  Lab 07/22/22 1827 07/28/22 1556 07/29/22 0444  NA 139 144 144  K 3.5 3.4* 3.2*  CL 99 106 113*  CO2 '25 26 25  '$ GLUCOSE  152* 117* 118*  BUN 11 32* 30*  CREATININE 0.82 2.44* 1.51*  CALCIUM 9.3 7.9* 8.1*  MG 2.5* 2.3  --   PHOS 3.9  --   --    GFR: Estimated Creatinine Clearance: 25.4 mL/min (A) (by C-G formula based on SCr of 1.51 mg/dL (H)). Liver Function Tests: Recent Labs  Lab 07/22/22 1827 07/28/22 1556 07/29/22 0444  AST 37 52* 32  ALT 26 60* 44  ALKPHOS 66 142* 121  BILITOT 1.4* 3.1* 1.7*  PROT 7.8 6.9 5.9*  ALBUMIN  4.0 3.1* 2.6*   Recent Labs  Lab 07/28/22 1556  LIPASE 20   No results for input(s): "AMMONIA" in the last 168 hours. Coagulation Profile: No results for input(s): "INR", "PROTIME" in the last 168 hours. Cardiac Enzymes: Recent Labs  Lab 07/22/22 1827  CKTOTAL 726*   BNP (last 3 results) No results for input(s): "PROBNP" in the last 8760 hours. HbA1C: No results for input(s): "HGBA1C" in the last 72 hours. CBG: Recent Labs  Lab 07/29/22 0212  GLUCAP 110*   Lipid Profile: No results for input(s): "CHOL", "HDL", "LDLCALC", "TRIG", "CHOLHDL", "LDLDIRECT" in the last 72 hours. Thyroid Function Tests: No results for input(s): "TSH", "T4TOTAL", "FREET4", "T3FREE", "THYROIDAB" in the last 72 hours. Anemia Panel: No results for input(s): "VITAMINB12", "FOLATE", "FERRITIN", "TIBC", "IRON", "RETICCTPCT" in the last 72 hours. Sepsis Labs: Recent Labs  Lab 07/28/22 2122  LATICACIDVEN 1.3    Recent Results (from the past 240 hour(s))  Culture, blood (Routine X 2) w Reflex to ID Panel     Status: None (Preliminary result)   Collection Time: 07/28/22  9:22 PM   Specimen: BLOOD  Result Value Ref Range Status   Specimen Description BLOOD LEFT ANTECUBITAL  Final   Special Requests   Final    BOTTLES DRAWN AEROBIC AND ANAEROBIC Blood Culture adequate volume Performed at [facility], [address] [place], [place] [address]    Culture PENDING  Incomplete   Report Status PENDING  Incomplete  Culture, blood (Routine X 2) w Reflex to ID Panel     Status: None (Preliminary result)   Collection Time: 07/28/22  9:22 PM   Specimen: BLOOD  Result Value Ref Range Status   Specimen Description BLOOD BLOOD LEFT WRIST  Final   Special Requests   Final    BOTTLES DRAWN AEROBIC AND ANAEROBIC Blood Culture adequate volume Performed at [facility], [address], [place], [place] [address]    Culture PENDING  Incomplete   Report Status PENDING  Incomplete         Radiology  Studies: CT Abdomen Pelvis Wo Contrast  Result Date: 07/28/2022 CLINICAL DATA:  Acute generalized abdominal pain. EXAM: CT ABDOMEN AND PELVIS WITHOUT CONTRAST TECHNIQUE: Multidetector CT imaging of the abdomen and pelvis was performed following the standard protocol without IV contrast. RADIATION DOSE REDUCTION: This exam was performed according to the departmental dose-optimization program which includes automated exposure control, adjustment of the mA and/or kV according to patient size and/or use of iterative reconstruction technique. COMPARISON:  July 12, 2022. FINDINGS: Lower chest: No acute abnormality. Hepatobiliary: Liver is unremarkable on these unenhanced images. Minimal cholelithiasis is noted. There appears to be multiple stones in the distal common bile duct resulting in moderate extrahepatic biliary dilatation. Pancreas: Unremarkable. No pancreatic ductal dilatation or surrounding inflammatory changes. Spleen: Normal in size without focal abnormality. Adrenals/Urinary Tract: Adrenal glands are unremarkable. Kidneys are normal, without renal calculi, focal lesion, or hydronephrosis. Bladder is unremarkable. Stomach/Bowel:  The stomach is unremarkable. Status post right colectomy. There is no abnormal bowel dilatation or inflammation. Sigmoid diverticulosis is noted without inflammation. Vascular/Lymphatic: Aortic atherosclerosis. No enlarged abdominal or pelvic lymph nodes. Reproductive: Uterus and bilateral adnexa are unremarkable. Other: No abdominal wall hernia or abnormality. No abdominopelvic ascites. Musculoskeletal: No acute or significant osseous findings. IMPRESSION: Choledocholithiasis is noted resulting in moderate extrahepatic biliary dilatation. Minimal cholelithiasis is noted. Status post right colectomy. Sigmoid diverticulosis without inflammation. Aortic Atherosclerosis (ICD10-I70.0). Electronically Signed   By: Marijo Conception M.D.   On: 07/28/2022 17:15        Scheduled  Meds:  donepezil  10 mg Oral QHS   heparin  5,000 Units Subcutaneous Q8H   insulin aspart  0-9 Units Subcutaneous Q6H   levothyroxine  75 mcg Oral Q0600   metoprolol tartrate  100 mg Oral BID   pantoprazole  40 mg Oral Daily   raloxifene  60 mg Oral QPC breakfast   Continuous Infusions:  lactated ringers 100 mL/hr at 07/29/22 0514   piperacillin-tazobactam (ZOSYN)  IV Stopped (07/29/22 0244)     LOS: 1 day    Time spent: 78mns    JKathie Dike MD [facility]   If 7PM-7AM, please contact night-coverage [email or web address]  07/29/2022, 7:47 AM

## 2022-07-29 NOTE — ED Notes (Addendum)
ED TO INPATIENT HANDOFF REPORT  ED Nurse Name and Phone #: [phone]  S Name/Age/Gender Glenda Nolan 87 y.o. female Room/Bed: APA18/APA18  Code Status   Code Status: Full Code  Home/SNF/Other  Patient oriented to: self Is this baseline? Yes   Triage Complete: Triage complete  Chief Complaint Choledocholithiasis [K80.50]  Triage Note Pt to er via ems, per ems pt is here for diarrhea and weakness.  States that they found pt leaned over the bed and had had a bm on the floor, states that pt has a hx of dementia, per ems last known well was yesterday afternoon, states that she lives alone.     Allergies No Known Allergies  Level of Care/Admitting Diagnosis ED Disposition     ED Disposition  Admit   Condition  --   Comment  Hospital Area: [place] [[id number]]  Level of Care: Telemetry Medical [[id number]]  May admit patient to [facility] or [facility] if equivalent level of care is available:: No  Covid Evaluation: Asymptomatic - no recent exposure (last 10 days) testing not required  Diagnosis: Choledocholithiasis [371062]  Admitting Physician: Bethena Roys [[id number]]  Attending Physician: Bethena Roys [[id number]]  Certification:: I certify this patient will need inpatient services for at least 2 midnights  Estimated Length of Stay: 2          B Medical/Surgery History Past Medical History:  Diagnosis Date   Anemia    Arthritis    Cataracts, bilateral    pending surgery May 2013   Colon cancer [facility]) 1994   Stage IIB   Diverticulosis    GERD (gastroesophageal reflux disease)    HTN (hypertension)    Hyperlipidemia    Hypothyroidism    Migraines    occasional migraines   Osteoporosis    Past Surgical History:  Procedure Laterality Date   CATARACT EXTRACTION W/PHACO Left 08/01/2013   Procedure: CATARACT EXTRACTION PHACO AND INTRAOCULAR LENS PLACEMENT ([place]);  Surgeon: Tonny Branch, MD;  Location: [facility];  Service: Ophthalmology;   Laterality: Left;  CDE:11.47   CATARACT EXTRACTION W/PHACO Right 08/15/2013   Procedure: CATARACT EXTRACTION PHACO AND INTRAOCULAR LENS PLACEMENT (I[facility]);  Surgeon: Tonny Branch, MD;  Location: [facility];  Service: Ophthalmology;  Laterality: Right;  CDE:  16.81   COLON SURGERY  1994   Right   COLONOSCOPY  04/01/2012   Rourk-Colonic ulcers at the anastomosis likely NSAID related, although ischemia is not excluded, status post biopsy, left-sided diverticula.  Internal hemorrhoids and anal papilla   COLONOSCOPY W/ BIOPSIES  05/04/09   Dr. Madolyn Frieze papilla, diverticulosis   DILATION AND CURETTAGE OF UTERUS     ESOPHAGOGASTRODUODENOSCOPY  04/01/2012   Rourk-Normal esophagus, small hiatal hernia, deformity, scarring friability of the antrum/prepyloric mucosa suggestive of prior peptic ulcer disease, normal D1 and D2.   HEMORRHOID SURGERY     knee arthrocopy(Right)     TOTAL KNEE ARTHROPLASTY Left 03/18/2017   Procedure: TOTAL KNEE ARTHROPLASTY;  Surgeon: Carole Civil, MD;  Location: [facility];  Service: Orthopedics;  Laterality: Left;     A IV Location/Drains/Wounds Patient Lines/Drains/Airways Status     Active Line/Drains/Airways     Name Placement date Placement time Site Days   Peripheral IV 07/28/22 20 G Anterior;Right Forearm 07/28/22  2253  Forearm  1   External Urinary Catheter 07/28/22  1315  --  1            Intake/Output Last 24 hours  Intake/Output Summary (Last 24  hours) at 07/29/2022 1405 Last data filed at 07/29/2022 0244 Gross per 24 hour  Intake 1077.12 ml  Output --  Net 1077.12 ml    Labs/Imaging Results for orders placed or performed during the hospital encounter of 07/28/22 (from the past 48 hour(s))  Comprehensive metabolic panel     Status: Abnormal   Collection Time: 07/28/22  3:56 PM  Result Value Ref Range   Sodium 144 135 - 145 mmol/L   Potassium 3.4 (L) 3.5 - 5.1 mmol/L   Chloride 106 98 - 111 mmol/L   CO2 26 22 - 32 mmol/L   Glucose, Bld 117 (H) 70  - 99 mg/dL    Comment: Glucose reference range applies only to samples taken after fasting for at least 8 hours.   BUN 32 (H) 8 - 23 mg/dL   Creatinine, Ser 2.44 (H) 0.44 - 1.00 mg/dL   Calcium 7.9 (L) 8.9 - 10.3 mg/dL   Total Protein 6.9 6.5 - 8.1 g/dL   Albumin 3.1 (L) 3.5 - 5.0 g/dL   AST 52 (H) 15 - 41 U/L   ALT 60 (H) 0 - 44 U/L   Alkaline Phosphatase 142 (H) 38 - 126 U/L   Total Bilirubin 3.1 (H) 0.3 - 1.2 mg/dL   GFR, Estimated 19 (L) >60 mL/min    Comment: (NOTE) Calculated using the CKD-EPI Creatinine Equation (2021)    Anion gap 12 5 - 15    Comment: Performed at [facility], [address] [place], [place] [address]  Lipase, blood     Status: None   Collection Time: 07/28/22  3:56 PM  Result Value Ref Range   Lipase 20 11 - 51 U/L    Comment: Performed at [facility], [address], [place], [place] [address]  CBC with Differential/Platelet     Status: Abnormal   Collection Time: 07/28/22  3:56 PM  Result Value Ref Range   WBC 28.0 (H) 4.0 - 10.5 K/uL   RBC 3.76 (L) 3.87 - 5.11 MIL/uL   Hemoglobin 11.5 (L) 12.0 - 15.0 g/dL   HCT 34.5 (L) 36.0 - 46.0 %   MCV 91.8 80.0 - 100.0 fL   MCH 30.6 26.0 - 34.0 pg   MCHC 33.3 30.0 - 36.0 g/dL   RDW 13.3 11.5 - 15.5 %   Platelets 174 150 - 400 K/uL   nRBC 0.0 0.0 - 0.2 %   Neutrophils Relative % 86 %   Neutro Abs 24.2 (H) 1.7 - 7.7 K/uL   Lymphocytes Relative 6 %   Lymphs Abs 1.6 0.7 - 4.0 K/uL   Monocytes Relative 4 %   Monocytes Absolute 1.1 (H) 0.1 - 1.0 K/uL   Eosinophils Relative 0 %   Eosinophils Absolute 0.0 0.0 - 0.5 K/uL   Basophils Relative 0 %   Basophils Absolute 0.1 0.0 - 0.1 K/uL   Immature Granulocytes 4 %   Abs Immature Granulocytes 1.05 (H) 0.00 - 0.07 K/uL    Comment: Performed at [facility], [address], [place], [place] [address]  Magnesium     Status: None   Collection Time: 07/28/22  3:56 PM  Result Value Ref Range   Magnesium 2.3 1.7 - 2.4 mg/dL    Comment: Performed at [facility], [address], [place],  [address]  Hemoglobin A1c     Status: Abnormal   Collection Time: 07/28/22  8:30 PM  Result Value Ref Range   Hgb A1c MFr Bld 6.8 (H) 4.8 - 5.6 %  Comment: (NOTE) Pre diabetes:          5.7%-6.4%  Diabetes:              >6.4%  Glycemic control for   <7.0% adults with diabetes    Mean Plasma Glucose 148.46 mg/dL    Comment: Performed at [facility] [address] [place], [place] [address]  Lactic acid, plasma     Status: None   Collection Time: 07/28/22  9:22 PM  Result Value Ref Range   Lactic Acid, Venous 1.3 0.5 - 1.9 mmol/L    Comment: Performed at [facility], [address] [place], [place] [address]  Culture, blood (Routine X 2) w Reflex to ID Panel     Status: None (Preliminary result)   Collection Time: 07/28/22  9:22 PM   Specimen: BLOOD  Result Value Ref Range   Specimen Description BLOOD LEFT ANTECUBITAL    Special Requests      BOTTLES DRAWN AEROBIC AND ANAEROBIC Blood Culture adequate volume   Culture      NO GROWTH < 12 HOURS Performed at [facility], [address] [place], [place] [address]    Report Status PENDING   Culture, blood (Routine X 2) w Reflex to ID Panel     Status: None (Preliminary result)   Collection Time: 07/28/22  9:22 PM   Specimen: BLOOD  Result Value Ref Range   Specimen Description BLOOD BLOOD LEFT WRIST    Special Requests      BOTTLES DRAWN AEROBIC AND ANAEROBIC Blood Culture adequate volume   Culture      NO GROWTH < 12 HOURS Performed at [facility], [address] [place], [place] [address]    Report Status PENDING   CBG monitoring, ED     Status: Abnormal   Collection Time: 07/29/22  2:12 AM  Result Value Ref Range   Glucose-Capillary 110 (H) 70 - 99 mg/dL    Comment: Glucose reference range applies only to samples taken after fasting for at least 8 hours.  CBC     Status: Abnormal   Collection Time: 07/29/22  4:44 AM  Result Value Ref Range   WBC 19.9 (H) 4.0 - 10.5 K/uL    RBC 3.53 (L) 3.87 - 5.11 MIL/uL   Hemoglobin 10.7 (L) 12.0 - 15.0 g/dL   HCT 32.6 (L) 36.0 - 46.0 %   MCV 92.4 80.0 - 100.0 fL   MCH 30.3 26.0 - 34.0 pg   MCHC 32.8 30.0 - 36.0 g/dL   RDW 13.3 11.5 - 15.5 %   Platelets 158 150 - 400 K/uL   nRBC 0.0 0.0 - 0.2 %    Comment: Performed at [facility], [address] [place], [place] [address]  Comprehensive metabolic panel     Status: Abnormal   Collection Time: 07/29/22  4:44 AM  Result Value Ref Range   Sodium 144 135 - 145 mmol/L   Potassium 3.2 (L) 3.5 - 5.1 mmol/L   Chloride 113 (H) 98 - 111 mmol/L   CO2 25 22 - 32 mmol/L   Glucose, Bld 118 (H) 70 - 99 mg/dL    Comment: Glucose reference range applies only to samples taken after fasting for at least 8 hours.   BUN 30 (H) 8 - 23 mg/dL   Creatinine, Ser 1.51 (H) 0.44 - 1.00 mg/dL   Calcium 8.1 (L) 8.9 - 10.3 mg/dL   Total Protein 5.9 (L) 6.5 - 8.1 g/dL   Albumin 2.6 (  L) 3.5 - 5.0 g/dL   AST 32 15 - 41 U/L   ALT 44 0 - 44 U/L   Alkaline Phosphatase 121 38 - 126 U/L   Total Bilirubin 1.7 (H) 0.3 - 1.2 mg/dL   GFR, Estimated 34 (L) >60 mL/min    Comment: (NOTE) Calculated using the CKD-EPI Creatinine Equation (2021)    Anion gap 6 5 - 15    Comment: Performed at [facility], [address] [place], [place] [address]  Urinalysis, Routine w reflex microscopic     Status: Abnormal   Collection Time: 07/29/22  7:40 AM  Result Value Ref Range   Color, Urine AMBER (A) YELLOW    Comment: BIOCHEMICALS MAY BE AFFECTED BY COLOR   APPearance HAZY (A) CLEAR   Specific Gravity, Urine 1.020 1.005 - 1.030   pH 6.0 5.0 - 8.0   Glucose, UA NEGATIVE NEGATIVE mg/dL   Hgb urine dipstick MODERATE (A) NEGATIVE   Bilirubin Urine NEGATIVE NEGATIVE   Ketones, ur NEGATIVE NEGATIVE mg/dL   Protein, ur 100 (A) NEGATIVE mg/dL   Nitrite NEGATIVE NEGATIVE   Leukocytes,Ua LARGE (A) NEGATIVE   RBC / HPF 6-10 0 - 5 RBC/hpf   WBC, UA 11-20 0 - 5 WBC/hpf   Bacteria, UA NONE SEEN NONE SEEN   Squamous  Epithelial / LPF 0-5 0 - 5   Mucus PRESENT     Comment: Performed at [facility], [address] [place], [place] [address]  CBG monitoring, ED     Status: Abnormal   Collection Time: 07/29/22  7:49 AM  Result Value Ref Range   Glucose-Capillary 120 (H) 70 - 99 mg/dL    Comment: Glucose reference range applies only to samples taken after fasting for at least 8 hours.  CBG monitoring, ED     Status: None   Collection Time: 07/29/22 12:08 PM  Result Value Ref Range   Glucose-Capillary 93 70 - 99 mg/dL    Comment: Glucose reference range applies only to samples taken after fasting for at least 8 hours.   US Abdomen Limited RUQ (LIVER/GB)  Result Date: 07/29/2022 CLINICAL DATA:  Right upper quadrant pain for 2 weeks EXAM: ULTRASOUND ABDOMEN LIMITED RIGHT UPPER QUADRANT COMPARISON:  CT abdomen/pelvis 1 day prior FINDINGS: Gallbladder: Shadowing gallstones are seen measuring up to 1.0 cm. There is no gallbladder wall thickening. Sonographic Murphy's sign was reported negative. Common bile duct: Diameter: 14 mm. There is mild intrahepatic biliary ductal dilatation Liver: No focal lesion identified. Within normal limits in parenchymal echogenicity. Portal vein is patent on color Doppler imaging with normal direction of blood flow towards the liver. Other: None. IMPRESSION: 1. Cholelithiasis without evidence of acute cholecystitis. 2. Intra and extrahepatic biliary ductal dilatation suspicious for choledocholithiasis. Electronically Signed   By: Valetta Mole M.D.   On: 07/29/2022 08:26   CT Abdomen Pelvis Wo Contrast  Result Date: 07/28/2022 CLINICAL DATA:  Acute generalized abdominal pain. EXAM: CT ABDOMEN AND PELVIS WITHOUT CONTRAST TECHNIQUE: Multidetector CT imaging of the abdomen and pelvis was performed following the standard protocol without IV contrast. RADIATION DOSE REDUCTION: This exam was performed according to the departmental dose-optimization program which includes automated exposure  control, adjustment of the mA and/or kV according to patient size and/or use of iterative reconstruction technique. COMPARISON:  July 12, 2022. FINDINGS: Lower chest: No acute abnormality. Hepatobiliary: Liver is unremarkable on these unenhanced images. Minimal cholelithiasis is noted. There appears to be multiple stones in the distal common bile duct resulting  in moderate extrahepatic biliary dilatation. Pancreas: Unremarkable. No pancreatic ductal dilatation or surrounding inflammatory changes. Spleen: Normal in size without focal abnormality. Adrenals/Urinary Tract: Adrenal glands are unremarkable. Kidneys are normal, without renal calculi, focal lesion, or hydronephrosis. Bladder is unremarkable. Stomach/Bowel: The stomach is unremarkable. Status post right colectomy. There is no abnormal bowel dilatation or inflammation. Sigmoid diverticulosis is noted without inflammation. Vascular/Lymphatic: Aortic atherosclerosis. No enlarged abdominal or pelvic lymph nodes. Reproductive: Uterus and bilateral adnexa are unremarkable. Other: No abdominal wall hernia or abnormality. No abdominopelvic ascites. Musculoskeletal: No acute or significant osseous findings. IMPRESSION: Choledocholithiasis is noted resulting in moderate extrahepatic biliary dilatation. Minimal cholelithiasis is noted. Status post right colectomy. Sigmoid diverticulosis without inflammation. Aortic Atherosclerosis (ICD10-I70.0). Electronically Signed   By: Marijo Conception M.D.   On: 07/28/2022 17:15    Pending Labs Unresulted Labs (From admission, onward)     Start     Ordered   07/30/22 0500  Comprehensive metabolic panel  Tomorrow morning,   R        07/29/22 0805   07/30/22 0500  CBC  Tomorrow morning,   R        07/29/22 0805   07/28/22 2059  C Difficile Quick Screen w PCR reflex  (C Difficile quick screen w PCR reflex panel )  Once, for 24 hours,   TIMED       References:    CDiff Information Tool   07/28/22 2058   07/28/22 1630   Gastrointestinal Panel by PCR , Stool  (Gastrointestinal Panel by PCR, Stool                                                                                                                                                     **Does Not include CLOSTRIDIUM DIFFICILE testing. **If CDIFF testing is needed, place order from the "C Difficile Testing" order set.**)  Once,   URGENT        07/28/22 1629            Vitals/Pain Today's Vitals   07/29/22 1142 07/29/22 1152 07/29/22 1200 07/29/22 1230  BP:   (!) 143/95 (!) 141/72  Pulse:   65   Resp:   (!) 22 (!) 21  Temp: 98.6 F (37 C)     TempSrc: Oral     SpO2:   95%   Weight:      Height:      PainSc:  0-No pain      Isolation Precautions Enteric precautions (UV disinfection)  Medications Medications  donepezil (ARICEPT) tablet 10 mg (10 mg Oral Not Given 07/29/22 0139)  metoprolol tartrate (LOPRESSOR) tablet 100 mg (100 mg Oral Given 07/29/22 1037)  pantoprazole (PROTONIX) EC tablet 40 mg (40 mg Oral Given 07/29/22 1038)  raloxifene (EVISTA) tablet 60 mg (60 mg Oral Given 07/29/22 1037)  insulin  aspart (novoLOG) injection 0-9 Units ( Subcutaneous Not Given 07/29/22 1228)  heparin injection 5,000 Units (5,000 Units Subcutaneous Given 07/29/22 0528)  acetaminophen (TYLENOL) tablet 650 mg (has no administration in time range)    Or  acetaminophen (TYLENOL) suppository 650 mg (has no administration in time range)  levothyroxine (SYNTHROID) tablet 75 mcg (75 mcg Oral Given 07/29/22 0528)  0.9 % NaCl with KCl 40 mEq / L  infusion ( Intravenous New Bag/Given 07/29/22 1043)  piperacillin-tazobactam (ZOSYN) IVPB 3.375 g (3.375 g Intravenous New Bag/Given 07/29/22 1141)  sodium chloride 0.9 % bolus 500 mL (0 mLs Intravenous Stopped 07/28/22 1557)  potassium chloride (KLOR-CON) packet 40 mEq (40 mEq Oral Given 07/28/22 2052)    Mobility  High fall risk   Focused Assessments   R Recommendations: See Admitting Provider Note  Report given to:    Additional Notes:

## 2022-07-29 NOTE — ED Notes (Signed)
Attempted report to [id number].

## 2022-07-29 NOTE — ED Notes (Signed)
Changed and cleaned pt and bed covers. Re put pt on pure wick. The vagina area pt stated was hurting. Vagina is raw and tender. Put barrier cream on the vagina area.

## 2022-07-29 NOTE — Progress Notes (Signed)
MD notified at 2050 due to patients several attempts of trying to get out of bed. MD ordered non violent restraint of lap belt. Per nursing judgement I decided not use lap belt due to its increase risk of making the patient even more agitated and restless. MD aware that lap belt was never in place order discontinued.

## 2022-07-30 ENCOUNTER — Encounter (HOSPITAL_COMMUNITY): Payer: Self-pay | Admitting: Internal Medicine

## 2022-07-30 ENCOUNTER — Encounter (HOSPITAL_COMMUNITY)
Admission: EM | Disposition: A | Discharge status: Skilled Nursing Facility | Payer: Self-pay | Source: Home / Self Care | Attending: Family Medicine

## 2022-07-30 ENCOUNTER — Inpatient Hospital Stay (HOSPITAL_COMMUNITY): Payer: Medicare Other | Admitting: Certified Registered"

## 2022-07-30 ENCOUNTER — Inpatient Hospital Stay (HOSPITAL_COMMUNITY): Payer: Medicare Other

## 2022-07-30 DIAGNOSIS — Z87891 Personal history of nicotine dependence: Secondary | ICD-10-CM | POA: Diagnosis not present

## 2022-07-30 DIAGNOSIS — K5710 Diverticulosis of small intestine without perforation or abscess without bleeding: Secondary | ICD-10-CM | POA: Diagnosis not present

## 2022-07-30 DIAGNOSIS — D72829 Elevated white blood cell count, unspecified: Secondary | ICD-10-CM

## 2022-07-30 DIAGNOSIS — K838 Other specified diseases of biliary tract: Secondary | ICD-10-CM | POA: Diagnosis not present

## 2022-07-30 DIAGNOSIS — R197 Diarrhea, unspecified: Secondary | ICD-10-CM

## 2022-07-30 DIAGNOSIS — K8051 Calculus of bile duct without cholangitis or cholecystitis with obstruction: Secondary | ICD-10-CM

## 2022-07-30 DIAGNOSIS — I10 Essential (primary) hypertension: Secondary | ICD-10-CM | POA: Diagnosis not present

## 2022-07-30 DIAGNOSIS — K8050 Calculus of bile duct without cholangitis or cholecystitis without obstruction: Secondary | ICD-10-CM | POA: Diagnosis not present

## 2022-07-30 HISTORY — PX: SPHINCTEROTOMY: SHX5544

## 2022-07-30 HISTORY — PX: ENDOSCOPIC RETROGRADE CHOLANGIOPANCREATOGRAPHY (ERCP) WITH PROPOFOL: SHX5810

## 2022-07-30 HISTORY — PX: REMOVAL OF STONES: SHX5545

## 2022-07-30 LAB — COMPREHENSIVE METABOLIC PANEL
ALT: 32 U/L (ref 0–44)
AST: 23 U/L (ref 15–41)
Albumin: 2.3 g/dL — ABNORMAL LOW (ref 3.5–5.0)
Alkaline Phosphatase: 117 U/L (ref 38–126)
Anion gap: 7 (ref 5–15)
BUN: 19 mg/dL (ref 8–23)
CO2: 23 mmol/L (ref 22–32)
Calcium: 8.6 mg/dL — ABNORMAL LOW (ref 8.9–10.3)
Chloride: 112 mmol/L — ABNORMAL HIGH (ref 98–111)
Creatinine, Ser: 1.14 mg/dL — ABNORMAL HIGH (ref 0.44–1.00)
GFR, Estimated: 47 mL/min — ABNORMAL LOW (ref >60)
Glucose, Bld: 96 mg/dL (ref 70–99)
Potassium: 3.7 mmol/L (ref 3.5–5.1)
Sodium: 142 mmol/L (ref 135–145)
Total Bilirubin: 1.3 mg/dL — ABNORMAL HIGH (ref 0.3–1.2)
Total Protein: 5.6 g/dL — ABNORMAL LOW (ref 6.5–8.1)

## 2022-07-30 LAB — CBC
HCT: 33.7 % — ABNORMAL LOW (ref 36.0–46.0)
Hemoglobin: 10.9 g/dL — ABNORMAL LOW (ref 12.0–15.0)
MCH: 29.9 pg (ref 26.0–34.0)
MCHC: 32.3 g/dL (ref 30.0–36.0)
MCV: 92.3 fL (ref 80.0–100.0)
Platelets: 186 10*3/uL (ref 150–400)
RBC: 3.65 MIL/uL — ABNORMAL LOW (ref 3.87–5.11)
RDW: 13.1 % (ref 11.5–15.5)
WBC: 12.5 10*3/uL — ABNORMAL HIGH (ref 4.0–10.5)
nRBC: 0 % (ref 0.0–0.2)

## 2022-07-30 LAB — GLUCOSE, CAPILLARY
Glucose-Capillary: 50 mg/dL — ABNORMAL LOW (ref 70–99)
Glucose-Capillary: 157 mg/dL — ABNORMAL HIGH (ref 70–99)
Glucose-Capillary: 86 mg/dL (ref 70–99)
Glucose-Capillary: 70 mg/dL (ref 70–99)
Glucose-Capillary: 66 mg/dL — ABNORMAL LOW (ref 70–99)
Glucose-Capillary: 131 mg/dL — ABNORMAL HIGH (ref 70–99)
Glucose-Capillary: 116 mg/dL — ABNORMAL HIGH (ref 70–99)
Glucose-Capillary: 116 mg/dL — ABNORMAL HIGH (ref 70–99)
Glucose-Capillary: 268 mg/dL — ABNORMAL HIGH (ref 70–99)
Glucose-Capillary: 175 mg/dL — ABNORMAL HIGH (ref 70–99)

## 2022-07-30 SURGERY — ENDOSCOPIC RETROGRADE CHOLANGIOPANCREATOGRAPHY (ERCP) WITH PROPOFOL
Anesthesia: General

## 2022-07-30 MED ORDER — PROPOFOL 10 MG/ML IV BOLUS
INTRAVENOUS | Status: DC | PRN
  Administered 2022-07-30: 70 mg via INTRAVENOUS

## 2022-07-30 MED ORDER — DICLOFENAC SUPPOSITORY 100 MG
RECTAL | Status: DC | PRN
  Administered 2022-07-30: 100 mg via RECTAL

## 2022-07-30 MED ORDER — PHENYLEPHRINE 80 MCG/ML (10ML) SYRINGE FOR IV PUSH (FOR BLOOD PRESSURE SUPPORT)
PREFILLED_SYRINGE | INTRAVENOUS | Status: DC | PRN
  Administered 2022-07-30 (×3): 80 ug via INTRAVENOUS

## 2022-07-30 MED ORDER — GLUCAGON HCL RDNA (DIAGNOSTIC) 1 MG IJ SOLR
INTRAMUSCULAR | Status: AC
  Filled 2022-07-30: qty 2

## 2022-07-30 MED ORDER — CIPROFLOXACIN IN D5W 400 MG/200ML IV SOLN
INTRAVENOUS | Status: AC
  Filled 2022-07-30: qty 200

## 2022-07-30 MED ORDER — DEXAMETHASONE SODIUM PHOSPHATE 10 MG/ML IJ SOLN
INTRAMUSCULAR | Status: DC | PRN
  Administered 2022-07-30: 5 mg via INTRAVENOUS

## 2022-07-30 MED ORDER — DEXTROSE 50 % IV SOLN
25.0000 g | INTRAVENOUS | Status: AC
  Administered 2022-07-30: 25 g via INTRAVENOUS

## 2022-07-30 MED ORDER — LIDOCAINE 2% (20 MG/ML) 5 ML SYRINGE
INTRAMUSCULAR | Status: DC | PRN
  Administered 2022-07-30: 60 mg via INTRAVENOUS

## 2022-07-30 MED ORDER — LABETALOL HCL 5 MG/ML IV SOLN
INTRAVENOUS | Status: DC | PRN
  Administered 2022-07-30 (×2): 5 mg via INTRAVENOUS

## 2022-07-30 MED ORDER — DEXTROSE 50 % IV SOLN
12.5000 g | INTRAVENOUS | Status: AC
  Administered 2022-07-30: 12.5 g via INTRAVENOUS

## 2022-07-30 MED ORDER — DEXTROSE 50 % IV SOLN
INTRAVENOUS | Status: AC
  Filled 2022-07-30: qty 50

## 2022-07-30 MED ORDER — LACTATED RINGERS IV SOLN: INTRAVENOUS | Status: DC

## 2022-07-30 MED ORDER — SUGAMMADEX SODIUM 200 MG/2ML IV SOLN
INTRAVENOUS | Status: DC | PRN
  Administered 2022-07-30: 160 mg via INTRAVENOUS

## 2022-07-30 MED ORDER — GLUCAGON HCL RDNA (DIAGNOSTIC) 1 MG IJ SOLR
INTRAMUSCULAR | Status: DC | PRN
  Administered 2022-07-30 (×3): .25 mg via INTRAVENOUS

## 2022-07-30 MED ORDER — FENTANYL CITRATE (PF) 100 MCG/2ML IJ SOLN
INTRAMUSCULAR | Status: AC
  Filled 2022-07-30: qty 2

## 2022-07-30 MED ORDER — FENTANYL CITRATE (PF) 250 MCG/5ML IJ SOLN
INTRAMUSCULAR | Status: DC | PRN
  Administered 2022-07-30 (×2): 50 ug via INTRAVENOUS

## 2022-07-30 MED ORDER — ONDANSETRON HCL 4 MG/2ML IJ SOLN
INTRAMUSCULAR | Status: DC | PRN
  Administered 2022-07-30: 4 mg via INTRAVENOUS

## 2022-07-30 MED ORDER — DEXTROSE 50 % IV SOLN
INTRAVENOUS | Status: AC
  Administered 2022-07-30: 50 mL
  Filled 2022-07-30: qty 50

## 2022-07-30 MED ORDER — DICLOFENAC SUPPOSITORY 100 MG
RECTAL | Status: AC
  Filled 2022-07-30: qty 1

## 2022-07-30 MED ORDER — HEPARIN SODIUM (PORCINE) 5000 UNIT/ML IJ SOLN
5000.0000 [IU] | Freq: Three times a day (TID) | INTRAMUSCULAR | Status: DC
  Administered 2022-07-31 – 2022-08-06 (×21): 5000 [IU] via SUBCUTANEOUS
  Filled 2022-07-30 (×20): qty 1

## 2022-07-30 MED ORDER — SODIUM CHLORIDE 0.9 % IV SOLN
INTRAVENOUS | Status: DC | PRN
  Administered 2022-07-30: 30 mL

## 2022-07-30 MED ORDER — ROCURONIUM BROMIDE 10 MG/ML (PF) SYRINGE
PREFILLED_SYRINGE | INTRAVENOUS | Status: DC | PRN
  Administered 2022-07-30: 40 mg via INTRAVENOUS

## 2022-07-30 NOTE — Progress Notes (Signed)
PROGRESS NOTE    Glenda Nolan  KJZ:[id number] DOB: 1935-06-18 DOA: 07/28/2022 PCP: Celene Squibb, MD  Chief Complaint  Patient presents with   Diarrhea    Brief Narrative:  87 year old female with history of dementia, hypertension, diabetes, brought to the hospital with worsening weakness and diarrhea.  Imaging of her abdomen indicated choledocholithiasis with biliary ductal dilatation.  LFTs were noted to be elevated.  She had AKI with elevated creatinine.  Started on IV fluids and intravenous antibiotics.  Case was reviewed with GI and [facility] who agreed with transfer to [facility] for further evaluation     Assessment & Plan:   Active Problems:   Choledocholithiasis   Diarrhea   AKI (acute kidney injury) ([place])   Essential hypertension   Dementia (HCC)   Diabetes ([place])   Hypokalemia   Assessment and Plan: Choledocholithiasis CT abd/pelvis with choledocholithiasis resulting in moderate extrahepatic biliary dilatation, minimal cholelithiasis RUQ [place] with cholelithiasis without cholecystitis Intra and extrahepatic biliary ductal dilatation concerning for choledocholithiasis  LFT's overall improved WBC 28 on presentation, improving Continue antibiotics Appreciate GI, planning for ERCP today Lives home alone, walks without cane/walker in home (though she has them and is supposed to use them).  Dresses herself, needs help with bathing.  No hx MI or CVA or HF.    Diarrhea Follow C diff ordered at [facility], not sure whether this needs to be canceled - no stools recorded  AKI (acute kidney injury) ([place]) Peaked at 2.44 Improving Hold lasix and telmisartan   Essential hypertension Stable. -Hold telmisartan and Lasix for now with AKI -Resume metoprolol  Dementia (HCC) Donepezil Delirium precautions  Diabetes (HCC) Hgba1c 6.8 Hold metformin SSi- S q6h    Hypokalemia Replace and follow         DVT prophylaxis: heparin Code Status: full Family Communication: 3  daughters at bedside Disposition:   Status is: Inpatient Remains inpatient appropriate because: need for continued procedures   Consultants:  GI  Procedures:  ERCP  Antimicrobials:  Anti-infectives (From admission, onward)    Start     Dose/Rate Route Frequency Ordered Stop   07/29/22 1100  [MAR Hold]  piperacillin-tazobactam (ZOSYN) IVPB 3.375 g        (MAR Hold since Wed 07/30/2022 at 1320.Hold Reason: Transfer to [facility])   3.375 g 12.5 mL/hr over 240 Minutes Intravenous Every 8 hours 07/29/22 1039     07/29/22 1030  piperacillin-tazobactam (ZOSYN) 2.25 g in sodium chloride 0.9 % 50 mL IVPB  Status:  Discontinued        2.25 g 100 mL/hr over 30 Minutes Intravenous Every 8 hours 07/29/22 1009 07/29/22 1039   07/28/22 1800  piperacillin-tazobactam (ZOSYN) IVPB 2.25 g  Status:  Discontinued        2.25 g 100 mL/hr over 30 Minutes Intravenous Every 8 hours 07/28/22 1750 07/29/22 1009       Subjective: No complaints Family at bedside  Objective: Vitals:   07/30/22 0923 07/30/22 1324 07/30/22 1523 07/30/22 1533  BP: (!) 177/83 (!) 165/76 137/79 (!) 163/91  Pulse: (!) 59 60 68 62  Resp: 16 12 (!) 21 15  Temp: 97.7 F (36.5 C) 97.7 F (36.5 C) 97.8 F (36.6 C)   TempSrc: Oral Temporal Temporal   SpO2: 100% 96% 93% 97%  Weight:      Height:        Intake/Output Summary (Last 24 hours) at 07/30/2022 1545 Last data filed at 07/30/2022 1526 Gross per 24  hour  Intake 1873.03 ml  Output 600 ml  Net 1273.03 ml   Filed Weights   07/28/22 1143  Weight: 65.8 kg    Examination:  General exam: Appears calm and comfortable  Respiratory system: unlabored Cardiovascular system: RRR Gastrointestinal system: Abdomen is nondistended, soft and nontender Central nervous system: Alert and oriented. No focal neurological deficits. Extremities: no LEE  Data Reviewed: I have personally reviewed following labs and imaging studies  CBC: Recent Labs  Lab 07/28/22 1556  07/29/22 0444 07/30/22 0758  WBC 28.0* 19.9* 12.5*  NEUTROABS 24.2*  --   --   HGB 11.5* 10.7* 10.9*  HCT 34.5* 32.6* 33.7*  MCV 91.8 92.4 92.3  PLT 174 158 595    Basic Metabolic Panel: Recent Labs  Lab 07/28/22 1556 07/29/22 0444 07/30/22 0758  NA 144 144 142  K 3.4* 3.2* 3.7  CL 106 113* 112*  CO2 '26 25 23  '$ GLUCOSE 117* 118* 96  BUN 32* 30* 19  CREATININE 2.44* 1.51* 1.14*  CALCIUM 7.9* 8.1* 8.6*  MG 2.3  --   --     GFR: Estimated Creatinine Clearance: 33.7 mL/min (Kyrin Gratz) (by C-G formula based on SCr of 1.14 mg/dL (H)).  Liver Function Tests: Recent Labs  Lab 07/28/22 1556 07/29/22 0444 07/30/22 0758  AST 52* 32 23  ALT 60* 44 32  ALKPHOS 142* 121 117  BILITOT 3.1* 1.7* 1.3*  PROT 6.9 5.9* 5.6*  ALBUMIN 3.1* 2.6* 2.3*    CBG: Recent Labs  Lab 07/30/22 0548 07/30/22 0726 07/30/22 1127 07/30/22 1328 07/30/22 1352  GLUCAP 157* 86 70 66* 131*     Recent Results (from the past 240 hour(s))  Culture, blood (Routine X 2) w Reflex to ID Panel     Status: None (Preliminary result)   Collection Time: 07/28/22  9:22 PM   Specimen: BLOOD  Result Value Ref Range Status   Specimen Description BLOOD LEFT ANTECUBITAL  Final   Special Requests   Final    BOTTLES DRAWN AEROBIC AND ANAEROBIC Blood Culture adequate volume   Culture   Final    NO GROWTH 2 DAYS Performed at [facility], [address], [place], [place] [address]    Report Status PENDING  Incomplete  Culture, blood (Routine X 2) w Reflex to ID Panel     Status: None (Preliminary result)   Collection Time: 07/28/22  9:22 PM   Specimen: BLOOD  Result Value Ref Range Status   Specimen Description BLOOD BLOOD LEFT WRIST  Final   Special Requests   Final    BOTTLES DRAWN AEROBIC AND ANAEROBIC Blood Culture adequate volume   Culture   Final    NO GROWTH 2 DAYS Performed at [facility], [address], [place], [place] [address]    Report Status PENDING  Incomplete         Radiology  Studies: US Abdomen Limited RUQ (LIVER/GB)  Result Date: 07/29/2022 CLINICAL DATA:  Right upper quadrant pain for 2 weeks EXAM: ULTRASOUND ABDOMEN LIMITED RIGHT UPPER QUADRANT COMPARISON:  CT abdomen/pelvis 1 day prior FINDINGS: Gallbladder: Shadowing gallstones are seen measuring up to 1.0 cm. There is no gallbladder wall thickening. Sonographic Murphy's sign was reported negative. Common bile duct: Diameter: 14 mm. There is mild intrahepatic biliary ductal dilatation Liver: No focal lesion identified. Within normal limits in parenchymal echogenicity. Portal vein is patent on color Doppler imaging with normal direction of blood flow towards the liver. Other: None. IMPRESSION: 1. Cholelithiasis without evidence of acute  cholecystitis. 2. Intra and extrahepatic biliary ductal dilatation suspicious for choledocholithiasis. Electronically Signed   By: Valetta Mole M.D.   On: 07/29/2022 08:26   CT Abdomen Pelvis Wo Contrast  Result Date: 07/28/2022 CLINICAL DATA:  Acute generalized abdominal pain. EXAM: CT ABDOMEN AND PELVIS WITHOUT CONTRAST TECHNIQUE: Multidetector CT imaging of the abdomen and pelvis was performed following the standard protocol without IV contrast. RADIATION DOSE REDUCTION: This exam was performed according to the departmental dose-optimization program which includes automated exposure control, adjustment of the mA and/or kV according to patient size and/or use of iterative reconstruction technique. COMPARISON:  July 12, 2022. FINDINGS: Lower chest: No acute abnormality. Hepatobiliary: Liver is unremarkable on these unenhanced images. Minimal cholelithiasis is noted. There appears to be multiple stones in the distal common bile duct resulting in moderate extrahepatic biliary dilatation. Pancreas: Unremarkable. No pancreatic ductal dilatation or surrounding inflammatory changes. Spleen: Normal in size without focal abnormality. Adrenals/Urinary Tract: Adrenal glands are unremarkable. Kidneys  are normal, without renal calculi, focal lesion, or hydronephrosis. Bladder is unremarkable. Stomach/Bowel: The stomach is unremarkable. Status post right colectomy. There is no abnormal bowel dilatation or inflammation. Sigmoid diverticulosis is noted without inflammation. Vascular/Lymphatic: Aortic atherosclerosis. No enlarged abdominal or pelvic lymph nodes. Reproductive: Uterus and bilateral adnexa are unremarkable. Other: No abdominal wall hernia or abnormality. No abdominopelvic ascites. Musculoskeletal: No acute or significant osseous findings. IMPRESSION: Choledocholithiasis is noted resulting in moderate extrahepatic biliary dilatation. Minimal cholelithiasis is noted. Status post right colectomy. Sigmoid diverticulosis without inflammation. Aortic Atherosclerosis (ICD10-I70.0). Electronically Signed   By: Marijo Conception M.D.   On: 07/28/2022 17:15        Scheduled Meds:  [MAR Hold] donepezil  10 mg Oral QHS   [MAR Hold] heparin  5,000 Units Subcutaneous Q8H   [MAR Hold] insulin aspart  0-9 Units Subcutaneous Q6H   [MAR Hold] levothyroxine  75 mcg Oral Q0600   [MAR Hold] metoprolol tartrate  100 mg Oral BID   [MAR Hold] pantoprazole  40 mg Oral Daily   [MAR Hold] raloxifene  60 mg Oral QPC breakfast   Continuous Infusions:  lactated ringers 100 mL/hr at 07/30/22 1238   [MAR Hold] piperacillin-tazobactam (ZOSYN)  IV 3.375 g (07/30/22 0942)     LOS: 2 days    Time spent: over 30 min    Fayrene Helper, MD [facility]   To contact the attending provider between 7A-7P or the covering provider during after hours 7P-7A, please log into the web site [email or web address] and access using universal [place] password for that web site. If you do not have the password, please call the hospital operator.  07/30/2022, 3:45 PM

## 2022-07-30 NOTE — Transfer of Care (Signed)
Immediate Anesthesia Transfer of Care Note  Patient: Glenda Nolan  Procedure(s) Performed: ENDOSCOPIC RETROGRADE CHOLANGIOPANCREATOGRAPHY (ERCP) WITH PROPOFOL SPHINCTEROTOMY REMOVAL OF STONES  Patient Location: Endoscopy Unit  Anesthesia Type:General  Level of Consciousness: awake and alert at baseline  Airway & Oxygen Therapy: Patient Spontanous Breathing  Post-op Assessment: Report given to RN and Post -op Vital signs reviewed and stable  Post vital signs: Reviewed and stable  Last Vitals:  Vitals Value Taken Time  BP 137/79 07/30/22 1523  Temp    Pulse 72 07/30/22 1525  Resp 17 07/30/22 1525  SpO2 77 % 07/30/22 1525  Vitals shown include unvalidated device data.  Last Pain:  Vitals:   07/30/22 1324  TempSrc: Temporal  PainSc: 0-No pain         Complications: No notable events documented.

## 2022-07-30 NOTE — Interval H&P Note (Signed)
History and Physical Interval Note:  07/30/2022 2:10 PM  Glenda Nolan  has presented today for surgery, with the diagnosis of Choledocholithiasis..  The various methods of treatment have been discussed with the patient and family. After consideration of risks, benefits and other options for treatment, the patient has consented to  Procedure(s): ENDOSCOPIC RETROGRADE CHOLANGIOPANCREATOGRAPHY (ERCP) WITH PROPOFOL (N/A) as a surgical intervention.  The patient's history has been reviewed, patient examined, no change in status, stable for surgery.  I have reviewed the patient's chart and labs.  Questions were answered to the patient's satisfaction.     Pricilla Riffle. [place] Plan

## 2022-07-30 NOTE — H&P (View-Only) (Signed)
[facility] Consult: 8:21 AM 07/30/2022  LOS: 2 days    Referring Provider: Dr Florene Glen  Primary Care Physician:  Celene Squibb, MD Primary Gastroenterologist:  [facility].  Dr Gala Romney.   Dtr Glenda Nolan [phone].  She was present at bedside.     Reason for Consultation:  choledocholithiasis.     HPI: Glenda Nolan is a 87 y.o. female.  Multiple medical issues including history of colon cancer.  Dementia.  Hemorrhoidectomy.  Diverticulosis.  Colon cancer.  Right hemicolectomy 1994.  Subsequent follow-up colonoscopies with no recurrence. 04/2009 EGD.  Procedure report not found but pathology showed benign small bowel mucosa with focal erosion and inflammatory exudate. 03/2012 colonoscopy: For melena, IDA, history colon cancer.  SARS at area of rectosigmoid anastomosis, likely NSAID related.  Left-sided diverticulosis.  Nonbleeding internal hemorrhoids and anal papilla.  Path: Ulcerated colonic mucosa, no evidence of viral changes.  No dysplasia, no malignancy. 03/2012 EGD: Small HH.  Scarring/deformity/friability at antrum, prepyloric mucosa suggesting previous ulcers.  D1/D2 normal.  ED visit 07/22/2022 after on witnessed fall at home. Labs then showed total CK7 26, some moderate renal insufficiency.  CT head a traumatic, + C spine degenerative changes.    Ultimately released back home with advisement to follow-up CK levels with her PCP within the next few days.  Other labs noted below.  Presented back to [facility] ED in the morning 2 days ago with diarrhea,,, incontinence.  Recent poor p.o. intake.  No nausea or vomiting.  Made complaint of generalized abdominal discomfort. No fevers.  Normotensive to hypertensive blood pressure readings.  No tachycardia.  Excellent room air  sats.  Trend of labs since her 8/29 visit as follows:  T. Bili 1.4... 3.1... 1.7.  Alk phos 66..  142..  121.  AST ALT 37/26.Marland Kitchen  52/60.Marland Kitchen  32/44.   AKI 32/2.4..  30/1.5 Hypokalemia 3.2. WBCs 11.4.Marland Kitchen  28..  19.9... 12.5.   Hb 10.7.  MCV 92.  Normal platelets.  CTAP wo contrast: Choledocholithiasis w multiple stones at distal CBD.  Moderately dilated CBD.Marland Kitchen  Cholelithiasis.  Pancreas unremarkable.  Changes of prior colectomy.  Sigmoid diverticulosis.  Aortic atherosclerosis. Abdominal ultrasound, limited: Cholelithiasis without cholecystitis.  Dilated intra and extrahepatic ducts suspicious for choledocholithiasis.  CBD 14 mm Doppler studies normal. Transferred to [facility] inpt.    Zosyn initiated.  Has had some hypoglycemic readings as low as 58 treated with dextrose.  Displaying confusion, restlessness, agitation and attempts to get out of the bed.  Patient lives alone but family visits her frequently and provides meals, housecleaning etc. for the patient. Quit smoking in 1994. No family history of gallbladder or pancreatic disease.   Past Medical History:  Diagnosis Date   Anemia    Arthritis    Cataracts, bilateral    pending surgery May 2013   Colon cancer [facility]) 1994   Stage IIB   Diverticulosis    GERD (gastroesophageal reflux disease)    HTN (hypertension)    Hyperlipidemia    Hypothyroidism  Migraines    occasional migraines   Osteoporosis     Past Surgical History:  Procedure Laterality Date   CATARACT EXTRACTION W/PHACO Left 08/01/2013   Procedure: CATARACT EXTRACTION PHACO AND INTRAOCULAR LENS PLACEMENT (IOC);  Surgeon: Tonny Branch, MD;  Location: [facility];  Service: Ophthalmology;  Laterality: Left;  CDE:11.47   CATARACT EXTRACTION W/PHACO Right 08/15/2013   Procedure: CATARACT EXTRACTION PHACO AND INTRAOCULAR LENS PLACEMENT (IOC);  Surgeon: Tonny Branch, MD;  Location: [facility];  Service: Ophthalmology;  Laterality: Right;  CDE:  16.81   COLON SURGERY  1994   Right    COLONOSCOPY  04/01/2012   Rourk-Colonic ulcers at the anastomosis likely NSAID related, although ischemia is not excluded, status post biopsy, left-sided diverticula.  Internal hemorrhoids and anal papilla   COLONOSCOPY W/ BIOPSIES  05/04/09   Dr. Madolyn Frieze papilla, diverticulosis   DILATION AND CURETTAGE OF UTERUS     ESOPHAGOGASTRODUODENOSCOPY  04/01/2012   Rourk-Normal esophagus, small hiatal hernia, deformity, scarring friability of the antrum/prepyloric mucosa suggestive of prior peptic ulcer disease, normal D1 and D2.   HEMORRHOID SURGERY     knee arthrocopy(Right)     TOTAL KNEE ARTHROPLASTY Left 03/18/2017   Procedure: TOTAL KNEE ARTHROPLASTY;  Surgeon: Carole Civil, MD;  Location: [facility];  Service: Orthopedics;  Laterality: Left;    Prior to Admission medications   Medication Sig Start Date End Date Taking? Authorizing Provider  acetaminophen (TYLENOL) 325 MG tablet Take 325 mg by mouth daily after lunch.    Yes [provider]  alendronate (FOSAMAX) 70 MG tablet Take 70 mg by mouth every Monday.  11/21/16  Yes [provider]  aspirin EC 325 MG EC tablet Take 1 tablet (325 mg total) by mouth daily with breakfast. 03/21/17  Yes Carole Civil, MD  cholecalciferol (VITAMIN D) 1000 UNITS tablet Take 1,000 Units by mouth daily after lunch.    Yes [provider]  donepezil (ARICEPT) 5 MG tablet Take 10 mg by mouth at bedtime. 02/09/20  Yes [provider]  furosemide (LASIX) 20 MG tablet Take 40 mg by mouth daily.  04/06/19  Yes [provider]  levothyroxine (SYNTHROID) 75 MCG tablet Take 75 mcg by mouth every morning. 02/11/21  Yes [provider]  metFORMIN (GLUCOPHAGE) 500 MG tablet Take 1 tablet by mouth daily. 04/10/21  Yes [provider]  metoprolol tartrate (LOPRESSOR) 100 MG tablet TAKE (1) TABLET BY MOUTHDAT BREAKFAST THEN TAKE (1) TABLET BY MOUTH AT BEDTIME FOR HEART RATE. 11/29/19  Yes [provider]  Multiple Vitamins-Minerals (PX SENIOR VITAMIN PO) Take 1 tablet by mouth daily after lunch.    Yes [provider]  omeprazole (PRILOSEC) 20 MG capsule Take 20 mg by mouth daily after breakfast. (0800)   Yes [provider]  potassium chloride (MICRO-K) 10 MEQ CR capsule Take 10 mEq by mouth daily. 04/10/21  Yes [provider]  raloxifene (EVISTA) 60 MG tablet Take 60 mg by mouth daily after breakfast. (0800)   Yes [provider]  simvastatin (ZOCOR) 40 MG tablet Take 40 mg by mouth at bedtime.    Yes [provider]  telmisartan (MICARDIS) 80 MG tablet Take 80 mg by mouth daily.  12/12/17  Yes [provider]  docusate sodium (COLACE) 100 MG capsule Take 1 capsule (100 mg total) by mouth 2 (two) times daily. Patient not taking: Reported on 07/28/2022 03/20/17   Carole Civil, MD    Scheduled Meds:  donepezil  10 mg Oral QHS   heparin  5,000 Units Subcutaneous Q8H   insulin aspart  0-9 Units Subcutaneous Q6H   levothyroxine  75 mcg Oral Q0600   metoprolol tartrate  100 mg Oral BID   pantoprazole  40 mg Oral Daily   raloxifene  60 mg Oral QPC breakfast   Infusions:  0.9 % NaCl with KCl 40 mEq / L 100 mL/hr at 07/30/22 0031   piperacillin-tazobactam (ZOSYN)  IV 3.375 g (07/30/22 0504)   PRN Meds: acetaminophen **OR** acetaminophen   Allergies as of 07/28/2022   (No Known Allergies)    Family History  Problem Relation Age of Onset   Leukemia Mother 25   Hypertension Mother    Heart failure Father 60   Coronary artery disease Sister        x2   Stroke Son     Social History   Socioeconomic History   Marital status: Divorced    Spouse name: Not on file   Number of children: 5   Years of education: Not on file   Highest education level: Not on file  Occupational History   Occupation: retired,[employer]: retired  Tobacco Use   Smoking status: Former    Packs/day: 0.50    Years: 10.00    Total  pack years: 5.00    Types: Cigarettes    Quit date: 11/24/1992    Years since quitting: 29.6   Smokeless tobacco: Former    Quit date: 12/22/1992  Vaping Use   Vaping Use: Never used  Substance and Sexual Activity   Alcohol use: Yes    Comment: a glass of wine once a year   Drug use: No   Sexual activity: Yes    Birth control/protection: Post-menopausal  Other Topics Concern   Not on file  Social History Narrative   2 children deceased-accident, CVA, 5 living   Lives alone-Glenda Nolan nearby   Social Determinants of Health   Financial Resource Strain: Not on file  Food Insecurity: Not on file  Transportation Needs: Not on file  Physical Activity: Not on file  Stress: Not on file  Social Connections: Not on file  Intimate Partner Violence: Not on file    REVIEW OF SYSTEMS: Constitutional: No specific weakness or fatigue. ENT:  No nose bleeds Pulm: No shortness of breath or cough CV:  No palpitations, no LE edema.  No chest pain GU:  No hematuria, no frequency GI: See HPI.  Normally has a good appetite and moves her bowels regularly. Heme: No unusual bleeding or bruising Transfusions: None Neuro:  No headaches, no peripheral tingling or numbness.  No dizziness.  No syncope. Derm:  No itching, no rash or sores.  Endocrine:  No sweats or chills.  No polyuria or dysuria Immunization: Reviewed. Travel: Not queried.   PHYSICAL EXAM: Vital signs in last 24 hours: Vitals:   07/29/22 1947 07/30/22 0504  BP: (!) 167/80 (!) 160/87  Pulse: 65 60  Resp: 18 18  Temp: 98.1 F (36.7 C) 98.1 F (36.7 C)  SpO2: 97% 98%   Wt Readings from Last 3 Encounters:  07/28/22 65.8 kg  07/22/22 66.2 kg  04/12/20 66.2 kg    General: Patient is alert, resting comfortably on the bed.  Appears elderly.  She is talkative but not always staying on subject.  Does not look toxic or ill. Head: No facial asymmetry or swelling.  No signs of head trauma. Eyes: No scleral icterus.  No  conjunctival pallor. Ears: Not hard of hearing Nose: No discharge or congestion Mouth: No teeth.  Mucous membranes are moist, pink, clear.  Tongue midline Neck: No JVD, no masses, no thyromegaly Lungs: No labored breathing or cough.  Lungs clear bilaterally. Heart: RRR.  No MRG.  S1, S2 present. Abdomen: Not tender.  Not distended.  Active bowel sounds.  No HSM, masses, bruits, hernias.  Long, well-healed midline surgical scar. Rectal: Deferred. Musc/Skeltl: No joint redness, swelling or gross deformity. Extremities: No CCE.  Feet are warm with brisk reperfusion. Neurologic: Alert.  Oriented to self and daughter.  Not oriented to year or situation.  Not currently agitated.  Fluid speech but tangential.  Follows all commands. Skin: No obvious sores, rashes, telangiectasia.  No jaundice Nodes: No cervical adenopathy Psych: Currently calm, cooperative.  Intake/Output from previous day: 09/05 0701 - 09/06 0700 In: 1073 [P.O.:80; I.V.:947.8; IV Piggyback:45.2] Out: 600 [Urine:600] Intake/Output this shift: No intake/output data recorded.  LAB RESULTS: Recent Labs    07/28/22 1556 07/29/22 0444  WBC 28.0* 19.9*  HGB 11.5* 10.7*  HCT 34.5* 32.6*  PLT 174 158   BMET Lab Results  Component Value Date   NA 144 07/29/2022   NA 144 07/28/2022   NA 139 07/22/2022   K 3.2 (L) 07/29/2022   K 3.4 (L) 07/28/2022   K 3.5 07/22/2022   CL 113 (H) 07/29/2022   CL 106 07/28/2022   CL 99 07/22/2022   CO2 25 07/29/2022   CO2 26 07/28/2022   CO2 25 07/22/2022   GLUCOSE 118 (H) 07/29/2022   GLUCOSE 117 (H) 07/28/2022   GLUCOSE 152 (H) 07/22/2022   BUN 30 (H) 07/29/2022   BUN 32 (H) 07/28/2022   BUN 11 07/22/2022   CREATININE 1.51 (H) 07/29/2022   CREATININE 2.44 (H) 07/28/2022   CREATININE 0.82 07/22/2022   CALCIUM 8.1 (L) 07/29/2022   CALCIUM 7.9 (L) 07/28/2022   CALCIUM 9.3 07/22/2022   LFT Recent Labs    07/28/22 1556 07/29/22 0444  PROT 6.9 5.9*  ALBUMIN 3.1* 2.6*  AST  52* 32  ALT 60* 44  ALKPHOS 142* 121  BILITOT 3.1* 1.7*   PT/INR Lab Results  Component Value Date   INR 0.89 03/11/2017   Hepatitis Panel No results for input(s): "HEPBSAG", "HCVAB", "HEPAIGM", "HEPBIGM" in the last 72 hours. C-Diff No components found for: "CDIFF" Lipase     Component Value Date/Time   LIPASE 20 07/28/2022 1556    Drugs of Abuse  No results found for: "LABOPIA", "COCAINSCRNUR", "LABBENZ", "AMPHETMU", "THCU", "LABBARB"   RADIOLOGY STUDIES: US Abdomen Limited RUQ (LIVER/GB)  Result Date: 07/29/2022 CLINICAL DATA:  Right upper quadrant pain for 2 weeks EXAM: ULTRASOUND ABDOMEN LIMITED RIGHT UPPER QUADRANT COMPARISON:  CT abdomen/pelvis 1 day prior FINDINGS: Gallbladder: Shadowing gallstones are seen measuring up to 1.0 cm. There is no gallbladder wall thickening. Sonographic Murphy's sign was reported negative. Common bile duct: Diameter: 14 mm. There is mild intrahepatic biliary ductal dilatation Liver: No focal lesion identified. Within normal limits in parenchymal echogenicity. Portal vein is patent on color Doppler imaging with normal direction of blood flow towards the liver. Other: None. IMPRESSION: 1. Cholelithiasis without evidence of acute cholecystitis. 2. Intra and extrahepatic biliary ductal dilatation suspicious for choledocholithiasis. Electronically Signed   By: Valetta Mole M.D.   On: 07/29/2022 08:26   CT Abdomen Pelvis Wo Contrast  Result Date: 07/28/2022 CLINICAL DATA:  Acute generalized abdominal pain. EXAM: CT ABDOMEN AND PELVIS WITHOUT CONTRAST TECHNIQUE: Multidetector  CT imaging of the abdomen and pelvis was performed following the standard protocol without IV contrast. RADIATION DOSE REDUCTION: This exam was performed according to the departmental dose-optimization program which includes automated exposure control, adjustment of the mA and/or kV according to patient size and/or use of iterative reconstruction technique. COMPARISON:  July 12, 2022.  FINDINGS: Lower chest: No acute abnormality. Hepatobiliary: Liver is unremarkable on these unenhanced images. Minimal cholelithiasis is noted. There appears to be multiple stones in the distal common bile duct resulting in moderate extrahepatic biliary dilatation. Pancreas: Unremarkable. No pancreatic ductal dilatation or surrounding inflammatory changes. Spleen: Normal in size without focal abnormality. Adrenals/Urinary Tract: Adrenal glands are unremarkable. Kidneys are normal, without renal calculi, focal lesion, or hydronephrosis. Bladder is unremarkable. Stomach/Bowel: The stomach is unremarkable. Status post right colectomy. There is no abnormal bowel dilatation or inflammation. Sigmoid diverticulosis is noted without inflammation. Vascular/Lymphatic: Aortic atherosclerosis. No enlarged abdominal or pelvic lymph nodes. Reproductive: Uterus and bilateral adnexa are unremarkable. Other: No abdominal wall hernia or abnormality. No abdominopelvic ascites. Musculoskeletal: No acute or significant osseous findings. IMPRESSION: Choledocholithiasis is noted resulting in moderate extrahepatic biliary dilatation. Minimal cholelithiasis is noted. Status post right colectomy. Sigmoid diverticulosis without inflammation. Aortic Atherosclerosis (ICD10-I70.0). Electronically Signed   By: Marijo Conception M.D.   On: 07/28/2022 17:15      IMPRESSION:      Choledocholithiasis.  ? Cholangitis given significant leukocytosis.  Zosyn in place.  History colon cancer.  Status post right hemicolectomy 1994.  Subsequent surveillance colonoscopies without recurrence of polyps or cancer, last performed 03/2012.    Dementia.      Elevated CK on 8/29.  Has not been rechecked since 8/29.     Hypokalemia, resolved.    [place] anemia.  Hx IDA as indication for prior endo/colonocopies.    PLAN:       ERCP set for 2:15 this afternoon.  Already on antibiotics.  Risks of pancreatitis, perforation, inability to perform procedure,  infection, bleeding were all discussed with the patient's daughter [place].  She is agreeable to proceed.    Hold future doses of SQ heparin until tomorrow morning   Azucena Freed  07/30/2022, 8:21 AM Phone [phone]    Attending Physician Note   I have taken a history, reviewed the chart and examined the patient. I performed a substantive portion of this encounter, including complete performance of at least one of the key components, in conjunction with the APP. I agree with the APP's note, impression and recommendations with my edits. My additional impressions and recommendations are as follows.   *Choledocholithiasis, possible cholangitis. Cholelithiasis. For ERCP, sphincterotomy and stone removal today. Not a good candidate for cholecystectomy. Continue IV Zosyn.   *History of colon cancer. S/P right hemicolectomy in 1994  *Dementia  *Anemia, history of IDA  Lucio Edward, MD [facility] See [facility], [facility], for our on call provider

## 2022-07-30 NOTE — Progress Notes (Addendum)
Patient is very confused , I have redirected patient several times without any success. MD was notified.  Tele sitter was started at 2231. The tele sitter was unsuccessfully due to patients inability to be redirected, tele sitter was discontinued at 2340. MD made aware.  Safety sitter was ordered at 2340 due to staffing no sitter at bedside at this time.

## 2022-07-30 NOTE — Progress Notes (Signed)
Mobility Specialist - Progress Note   07/30/22 1046  Mobility  Activity Stood at bedside  Level of Assistance Moderate assist, patient does 50-74%  Assistive Device Front wheel walker  Distance Ambulated (ft) 0 ft  Activity Response Tolerated well  $Mobility charge 1 Mobility    Pt received in bed agreeable to mobility. ModA to move to EOB from supine. ModA to stand from EOB. Pt relied heavily on RW to remain steady while standing. Declined further mobility d/t fatigue. Left in bed w/ call bell in reach and all needs met.     Mobility Specialist  

## 2022-07-30 NOTE — Progress Notes (Signed)
PT Cancellation Note  Patient Details Name: Glenda Nolan MRN: [id number] DOB: 22-Feb-1935   Cancelled Treatment:    Reason Eval/Treat Not Completed: Patient at procedure or test/unavailable Will follow up as schedule allows.   Lou Miner, DPT  Acute Rehabilitation Services  Office: [phone]    Rudean Hitt 07/30/2022, 1:23 PM

## 2022-07-30 NOTE — Op Note (Signed)
[facility] Patient Name: Glenda Nolan Procedure Date : 07/30/2022 MRN: [id number] Attending MD: Ladene Artist , MD Date of Birth: May 20, 1935 CSN: [id number] Age: 87 Admit Type: Inpatient Procedure:                ERCP Indications:              Bile duct stone(s), Suspected ascending cholangitis Providers:                Pricilla Riffle. Fuller Plan, MD, Dulcy Fanny, Darliss Cheney,                            Technician Referring MD:             [facility] Medicines:                General Anesthesia Complications:            No immediate complications. Estimated Blood Loss:     Estimated blood loss: none. Procedure:                Pre-Anesthesia Assessment:                           - Prior to the procedure, a History and Physical                            was performed, and patient medications and                            allergies were reviewed. The patient's tolerance of                            previous anesthesia was also reviewed. The risks                            and benefits of the procedure and the sedation                            options and risks were discussed with the patient.                            All questions were answered, and informed consent                            was obtained. Prior Anticoagulants: The patient has                            taken heparin, last dose was day of procedure. ASA                            Grade Assessment: III - A patient with severe                            systemic disease. After reviewing the risks and  benefits, the patient was deemed in satisfactory                            condition to undergo the procedure.                           After obtaining informed consent, the scope was                            passed under direct vision. Throughout the                            procedure, the patient's blood pressure, pulse, and                            oxygen saturations were  monitored continuously. The                            [id number]-[id number] ([id number]) Olympus duodenoscope was                            introduced through the mouth, and used to inject                            contrast into and used to inject contrast into the                            bile duct. The ERCP was somewhat difficult due to                            challenging cannulation because of peridiverticular                            papilla. The patient tolerated the procedure well. Scope In: Scope Out: Findings:      The scout film was normal. The scope was advanced to a normal appearing       major papilla in the descending duodenum located inside a moderate-sized       diverticulum. The stomach was J-shpaed and patient required       repositioning to left lateral decubitus postion to pass the pylorus.       Limited examination of the pharynx, larynx and associated structures,       and upper GI tract showed a benign appearing distal esophageal       stricture, a J-shaped stomach and a moderate-sized periampullary       diverticulum, otherwise appeared normal. A straight Roadrunner wire was       passed into the biliary tree. The short-nosed traction sphincterotome       was changed to a Revolution sphicterotome with an 0.025 guidewire after       several attempts with the standard sphinctertome and 0.035 guidewire       were not successful. The sphincterotome was passed over the guidewire       and the bile duct was then deeply cannulated. Contrast was injected. I       personally interpreted the bile duct images.  Ductal flow of contrast was       adequate. The entire biliary tree was diffusely dilated, with stones       causing an obstruction. The largest CBD diameter was 15 mm. The common       bile duct contained two stones and sludge, the largest stone was 8 mm in       diameter. The cystic duct and gallbladder did not fill. A 7 mm biliary       sphincterotomy was made with a  traction (standard) sphincterotome using       ERBE electrocautery. Unable to safely extend the sphincterotomy beyond 7       mm inside the diverticulum. There was no post-sphincterotomy bleeding.       The biliary tree was swept several times with a 9-12 mm balloon starting       at the bifurcation. Sludge was swept from the duct. All stones were       removed. No clear evidence of pus noted however pus may have been       obscured by sludge. The final occlusion cholangiogram was free of       filling defects. Air refluxed into the biliary tree. Very good biliary       drainage was noted. The PD was not cannulated or injected by intention. Impression:               - The entire biliary tree was dilated, with stones                            causing an obstruction.                           - Choledocholithiasis and sludge was found.                            Complete removal was accomplished by biliary                            sphincterotomy and balloon extraction.                           - Periampullary diverticulum.                           - Distal esophageal stricture. Recommendation:           - Avoid aspirin and nonsteroidal anti-inflammatory                            medicines for 2 weeks.                           - Return patient to hospital ward for ongoing care.                           - Observe patient's clinical course following                            today's ERCP with therapeutic intervention.                           -  Continue antibiotics for a least 7 days for                            possible cholangitis.                           - Outpatient GI follow up with Dr. Gala Romney. Procedure Code(s):        --- Professional ---                           [phone], Endoscopic retrograde                            cholangiopancreatography (ERCP); with removal of                            calculi/debris from biliary/pancreatic duct(s)                           [id number],  Endoscopic retrograde                            cholangiopancreatography (ERCP); with                            sphincterotomy/papillotomy Diagnosis Code(s):        --- Professional ---                           K80.51, Calculus of bile duct without cholangitis                            or cholecystitis with obstruction CPT copyright 2019 [employer]. All rights reserved. The codes documented in this report are preliminary and upon coder review may  be revised to meet current compliance requirements. Ladene Artist, MD 07/30/2022 3:40:14 PM This report has been signed electronically. Number of Addenda: 0

## 2022-07-30 NOTE — Anesthesia Preprocedure Evaluation (Addendum)
Anesthesia Evaluation  Patient identified by MRN, date of birth, ID band Patient awake and Patient confused    Reviewed: Allergy & Precautions, NPO status , Patient's Chart, lab work & pertinent test results  Airway Mallampati: II  TM Distance: >3 FB Neck ROM: Full    Dental  (+) Dental Advisory Given   Pulmonary former smoker,    Pulmonary exam normal breath sounds clear to auscultation       Cardiovascular hypertension, Pt. on medications Normal cardiovascular exam Rhythm:Regular Rate:Normal  Echo 2021 1. Left ventricular ejection fraction, by estimation, is 60 to 65%. The left ventricle has normal function. The left ventricle has no regional wall motion abnormalities. Left ventricular diastolic parameters were normal.  2. Right ventricular systolic function is normal. The right ventricular size is normal. There is normal pulmonary artery systolic pressure. The estimated right ventricular systolic pressure is 26.3 mmHg.  3. The mitral valve is grossly normal. Trivial mitral valve  regurgitation.  4. The aortic valve is tricuspid. Aortic valve regurgitation is trivial.    Neuro/Psych  Headaches, PSYCHIATRIC DISORDERS Dementia    GI/Hepatic Neg liver ROS, GERD  ,  Endo/Other  diabetesHypothyroidism   Renal/GU Renal InsufficiencyRenal disease     Musculoskeletal  (+) Arthritis ,   Abdominal   Peds  Hematology  (+) Blood dyscrasia, anemia ,   Anesthesia Other Findings   Reproductive/Obstetrics                            Anesthesia Physical Anesthesia Plan  ASA: 3  Anesthesia Plan: General   Post-op Pain Management: Minimal or no pain anticipated   Induction: Intravenous  PONV Risk Score and Plan: 3 and Ondansetron, Treatment may vary due to age or medical condition and Dexamethasone  Airway Management Planned: Oral ETT  Additional Equipment:   Intra-op Plan:   Post-operative  Plan: Extubation in OR  Informed Consent: I have reviewed the patients History and Physical, chart, labs and discussed the procedure including the risks, benefits and alternatives for the proposed anesthesia with the patient or authorized representative who has indicated his/her understanding and acceptance.     Dental advisory given and Consent reviewed with POA  Plan Discussed with: CRNA  Anesthesia Plan Comments:        Anesthesia Quick Evaluation

## 2022-07-30 NOTE — Progress Notes (Addendum)
MD notified of patients blood sugar of 50. Patient is alert to self and reports feeling fine. Following hypoglycemia orders per protocol.    0520 Dextrose 50% soulution 25g was given. Rechecked CBG now 157 at 0548. MD made aware no new orders at this time. Patient went back to sleep.

## 2022-07-30 NOTE — Assessment & Plan Note (Addendum)
Will hold on c diff for now

## 2022-07-30 NOTE — Anesthesia Procedure Notes (Signed)
Procedure Name: Intubation Date/Time: 07/30/2022 2:11 PM  Performed by: Amadeo Garnet, CRNAPre-anesthesia Checklist: Patient identified, Emergency Drugs available, Suction available and Patient being monitored Patient Re-evaluated:Patient Re-evaluated prior to induction Oxygen Delivery Method: Circle system utilized Preoxygenation: Pre-oxygenation with 100% oxygen Induction Type: IV induction Ventilation: Mask ventilation without difficulty Laryngoscope Size: Mac and 3 Grade View: Grade I Tube type: Oral Tube size: 7.0 mm Number of attempts: 1 Airway Equipment and Method: Stylet and Oral airway Placement Confirmation: ETT inserted through vocal cords under direct vision, positive ETCO2 and breath sounds checked- equal and bilateral Secured at: 21 cm Tube secured with: Tape Dental Injury: Teeth and Oropharynx as per pre-operative assessment

## 2022-07-30 NOTE — Progress Notes (Addendum)
Hypoglycemic Event  CBG: 66  Treatment: D50 25 mL (12.5 gm)  Symptoms: None  Follow-up CBG: Time: 1352 CBG Result: 131  Possible Reasons for Event: Other: NPO for procedure  Comments/MD notified:MDA [place] notified. 12.5 gm of D50 given @ 1332 per hypoglycemia protocol.     Debarah Crape, RN 07/30/22 1:54 PM

## 2022-07-30 NOTE — Consult Note (Addendum)
[facility] Consult: 8:21 AM 07/30/2022  LOS: 2 days    Referring Provider: Dr Florene Glen  Primary Care Physician:  Celene Squibb, MD Primary Gastroenterologist:  [facility].  Dr Gala Romney.   Dtr Celso Amy George [place] [phone].  She was present at bedside.     Reason for Consultation:  choledocholithiasis.     HPI: Glenda Nolan is a 87 y.o. female.  Multiple medical issues including history of colon cancer.  Dementia.  Hemorrhoidectomy.  Diverticulosis.  Colon cancer.  Right hemicolectomy 1994.  Subsequent follow-up colonoscopies with no recurrence. 04/2009 EGD.  Procedure report not found but pathology showed benign small bowel mucosa with focal erosion and inflammatory exudate. 03/2012 colonoscopy: For melena, IDA, history colon cancer.  SARS at area of rectosigmoid anastomosis, likely NSAID related.  Left-sided diverticulosis.  Nonbleeding internal hemorrhoids and anal papilla.  Path: Ulcerated colonic mucosa, no evidence of viral changes.  No dysplasia, no malignancy. 03/2012 EGD: Small HH.  Scarring/deformity/friability at antrum, prepyloric mucosa suggesting previous ulcers.  D1/D2 normal.  ED visit 07/22/2022 after on witnessed fall at home. Labs then showed total CK7 26, some moderate renal insufficiency.  CT head a traumatic, + C spine degenerative changes.    Ultimately released back home with advisement to follow-up CK levels with her PCP within the next few days.  Other labs noted below.  Presented back to [facility] ED in the morning 2 days ago with diarrhea,,, incontinence.  Recent poor p.o. intake.  No nausea or vomiting.  Made complaint of generalized abdominal discomfort. No fevers.  Normotensive to hypertensive blood pressure readings.  No tachycardia.  Excellent room air  sats.  Trend of labs since her 8/29 visit as follows:  T. Bili 1.4... 3.1... 1.7.  Alk phos 66..  142..  121.  AST ALT 37/26.Marland Kitchen  52/60.Marland Kitchen  32/44.   AKI 32/2.4..  30/1.5 Hypokalemia 3.2. WBCs 11.4.Marland Kitchen  28..  19.9... 12.5.   Hb 10.7.  MCV 92.  Normal platelets.  CTAP wo contrast: Choledocholithiasis w multiple stones at distal CBD.  Moderately dilated CBD.Marland Kitchen  Cholelithiasis.  Pancreas unremarkable.  Changes of prior colectomy.  Sigmoid diverticulosis.  Aortic atherosclerosis. Abdominal ultrasound, limited: Cholelithiasis without cholecystitis.  Dilated intra and extrahepatic ducts suspicious for choledocholithiasis.  CBD 14 mm Doppler studies normal. Transferred to [facility] inpt.    Zosyn initiated.  Has had some hypoglycemic readings as low as 58 treated with dextrose.  Displaying confusion, restlessness, agitation and attempts to get out of the bed.  Patient lives alone but family visits her frequently and provides meals, housecleaning etc. for the patient. Quit smoking in 1994. No family history of gallbladder or pancreatic disease.   Past Medical History:  Diagnosis Date   Anemia    Arthritis    Cataracts, bilateral    pending surgery May 2013   Colon cancer [facility]) 1994   Stage IIB   Diverticulosis    GERD (gastroesophageal reflux disease)    HTN (hypertension)    Hyperlipidemia    Hypothyroidism  Migraines    occasional migraines   Osteoporosis     Past Surgical History:  Procedure Laterality Date   CATARACT EXTRACTION W/PHACO Left 08/01/2013   Procedure: CATARACT EXTRACTION PHACO AND INTRAOCULAR LENS PLACEMENT (IOC);  Surgeon: Tonny Branch, MD;  Location: [facility];  Service: Ophthalmology;  Laterality: Left;  CDE:11.47   CATARACT EXTRACTION W/PHACO Right 08/15/2013   Procedure: CATARACT EXTRACTION PHACO AND INTRAOCULAR LENS PLACEMENT (IOC);  Surgeon: Tonny Branch, MD;  Location: [facility];  Service: Ophthalmology;  Laterality: Right;  CDE:  16.81   COLON SURGERY  1994   Right    COLONOSCOPY  04/01/2012   Rourk-Colonic ulcers at the anastomosis likely NSAID related, although ischemia is not excluded, status post biopsy, left-sided diverticula.  Internal hemorrhoids and anal papilla   COLONOSCOPY W/ BIOPSIES  05/04/09   Dr. Madolyn Frieze papilla, diverticulosis   DILATION AND CURETTAGE OF UTERUS     ESOPHAGOGASTRODUODENOSCOPY  04/01/2012   Rourk-Normal esophagus, small hiatal hernia, deformity, scarring friability of the antrum/prepyloric mucosa suggestive of prior peptic ulcer disease, normal D1 and D2.   HEMORRHOID SURGERY     knee arthrocopy(Right)     TOTAL KNEE ARTHROPLASTY Left 03/18/2017   Procedure: TOTAL KNEE ARTHROPLASTY;  Surgeon: Carole Civil, MD;  Location: [facility];  Service: Orthopedics;  Laterality: Left;    Prior to Admission medications   Medication Sig Start Date End Date Taking? Authorizing Provider  acetaminophen (TYLENOL) 325 MG tablet Take 325 mg by mouth daily after lunch.    Yes [provider]  alendronate (FOSAMAX) 70 MG tablet Take 70 mg by mouth every Monday.  11/21/16  Yes [provider]  aspirin EC 325 MG EC tablet Take 1 tablet (325 mg total) by mouth daily with breakfast. 03/21/17  Yes Carole Civil, MD  cholecalciferol (VITAMIN D) 1000 UNITS tablet Take 1,000 Units by mouth daily after lunch.    Yes [provider]  donepezil (ARICEPT) 5 MG tablet Take 10 mg by mouth at bedtime. 02/09/20  Yes [provider]  furosemide (LASIX) 20 MG tablet Take 40 mg by mouth daily.  04/06/19  Yes [provider]  levothyroxine (SYNTHROID) 75 MCG tablet Take 75 mcg by mouth every morning. 02/11/21  Yes [provider]  metFORMIN (GLUCOPHAGE) 500 MG tablet Take 1 tablet by mouth daily. 04/10/21  Yes [provider]  metoprolol tartrate (LOPRESSOR) 100 MG tablet TAKE (1) TABLET BY MOUTHDAT BREAKFAST THEN TAKE (1) TABLET BY MOUTH AT BEDTIME FOR HEART RATE. 11/29/19  Yes [provider]  Multiple Vitamins-Minerals (PX SENIOR VITAMIN PO) Take 1 tablet by mouth daily after lunch.    Yes [provider]  omeprazole (PRILOSEC) 20 MG capsule Take 20 mg by mouth daily after breakfast. (0800)   Yes [provider]  potassium chloride (MICRO-K) 10 MEQ CR capsule Take 10 mEq by mouth daily. 04/10/21  Yes [provider]  raloxifene (EVISTA) 60 MG tablet Take 60 mg by mouth daily after breakfast. (0800)   Yes [provider]  simvastatin (ZOCOR) 40 MG tablet Take 40 mg by mouth at bedtime.    Yes [provider]  telmisartan (MICARDIS) 80 MG tablet Take 80 mg by mouth daily.  12/12/17  Yes [provider]  docusate sodium (COLACE) 100 MG capsule Take 1 capsule (100 mg total) by mouth 2 (two) times daily. Patient not taking: Reported on 07/28/2022 03/20/17   Carole Civil, MD    Scheduled Meds:  donepezil  10 mg Oral QHS   heparin  5,000 Units Subcutaneous Q8H   insulin aspart  0-9 Units Subcutaneous Q6H   levothyroxine  75 mcg Oral Q0600   metoprolol tartrate  100 mg Oral BID   pantoprazole  40 mg Oral Daily   raloxifene  60 mg Oral QPC breakfast   Infusions:  0.9 % NaCl with KCl 40 mEq / L 100 mL/hr at 07/30/22 0031   piperacillin-tazobactam (ZOSYN)  IV 3.375 g (07/30/22 0504)   PRN Meds: acetaminophen **OR** acetaminophen   Allergies as of 07/28/2022   (No Known Allergies)    Family History  Problem Relation Age of Onset   Leukemia Mother 25   Hypertension Mother    Heart failure Father 60   Coronary artery disease Sister        x2   Stroke Son     Social History   Socioeconomic History   Marital status: Divorced    Spouse name: Not on file   Number of children: 5   Years of education: Not on file   Highest education level: Not on file  Occupational History   Occupation: retired,[employer]: retired  Tobacco Use   Smoking status: Former    Packs/day: 0.50    Years: 10.00    Total  pack years: 5.00    Types: Cigarettes    Quit date: 11/24/1992    Years since quitting: 29.6   Smokeless tobacco: Former    Quit date: 12/22/1992  Vaping Use   Vaping Use: Never used  Substance and Sexual Activity   Alcohol use: Yes    Comment: a glass of wine once a year   Drug use: No   Sexual activity: Yes    Birth control/protection: Post-menopausal  Other Topics Concern   Not on file  Social History Narrative   2 children deceased-accident, CVA, 5 living   Lives alone-Deborah Ordaz nearby   Social Determinants of Health   Financial Resource Strain: Not on file  Food Insecurity: Not on file  Transportation Needs: Not on file  Physical Activity: Not on file  Stress: Not on file  Social Connections: Not on file  Intimate Partner Violence: Not on file    REVIEW OF SYSTEMS: Constitutional: No specific weakness or fatigue. ENT:  No nose bleeds Pulm: No shortness of breath or cough CV:  No palpitations, no LE edema.  No chest pain GU:  No hematuria, no frequency GI: See HPI.  Normally has a good appetite and moves her bowels regularly. Heme: No unusual bleeding or bruising Transfusions: None Neuro:  No headaches, no peripheral tingling or numbness.  No dizziness.  No syncope. Derm:  No itching, no rash or sores.  Endocrine:  No sweats or chills.  No polyuria or dysuria Immunization: Reviewed. Travel: Not queried.   PHYSICAL EXAM: Vital signs in last 24 hours: Vitals:   07/29/22 1947 07/30/22 0504  BP: (!) 167/80 (!) 160/87  Pulse: 65 60  Resp: 18 18  Temp: 98.1 F (36.7 C) 98.1 F (36.7 C)  SpO2: 97% 98%   Wt Readings from Last 3 Encounters:  07/28/22 65.8 kg  07/22/22 66.2 kg  04/12/20 66.2 kg    General: Patient is alert, resting comfortably on the bed.  Appears elderly.  She is talkative but not always staying on subject.  Does not look toxic or ill. Head: No facial asymmetry or swelling.  No signs of head trauma. Eyes: No scleral icterus.  No  conjunctival pallor. Ears: Not hard of hearing Nose: No discharge or congestion Mouth: No teeth.  Mucous membranes are moist, pink, clear.  Tongue midline Neck: No JVD, no masses, no thyromegaly Lungs: No labored breathing or cough.  Lungs clear bilaterally. Heart: RRR.  No MRG.  S1, S2 present. Abdomen: Not tender.  Not distended.  Active bowel sounds.  No HSM, masses, bruits, hernias.  Long, well-healed midline surgical scar. Rectal: Deferred. Musc/Skeltl: No joint redness, swelling or gross deformity. Extremities: No CCE.  Feet are warm with brisk reperfusion. Neurologic: Alert.  Oriented to self and daughter.  Not oriented to year or situation.  Not currently agitated.  Fluid speech but tangential.  Follows all commands. Skin: No obvious sores, rashes, telangiectasia.  No jaundice Nodes: No cervical adenopathy Psych: Currently calm, cooperative.  Intake/Output from previous day: 09/05 0701 - 09/06 0700 In: 1073 [P.O.:80; I.V.:947.8; IV Piggyback:45.2] Out: 600 [Urine:600] Intake/Output this shift: No intake/output data recorded.  LAB RESULTS: Recent Labs    07/28/22 1556 07/29/22 0444  WBC 28.0* 19.9*  HGB 11.5* 10.7*  HCT 34.5* 32.6*  PLT 174 158   BMET Lab Results  Component Value Date   NA 144 07/29/2022   NA 144 07/28/2022   NA 139 07/22/2022   K 3.2 (L) 07/29/2022   K 3.4 (L) 07/28/2022   K 3.5 07/22/2022   CL 113 (H) 07/29/2022   CL 106 07/28/2022   CL 99 07/22/2022   CO2 25 07/29/2022   CO2 26 07/28/2022   CO2 25 07/22/2022   GLUCOSE 118 (H) 07/29/2022   GLUCOSE 117 (H) 07/28/2022   GLUCOSE 152 (H) 07/22/2022   BUN 30 (H) 07/29/2022   BUN 32 (H) 07/28/2022   BUN 11 07/22/2022   CREATININE 1.51 (H) 07/29/2022   CREATININE 2.44 (H) 07/28/2022   CREATININE 0.82 07/22/2022   CALCIUM 8.1 (L) 07/29/2022   CALCIUM 7.9 (L) 07/28/2022   CALCIUM 9.3 07/22/2022   LFT Recent Labs    07/28/22 1556 07/29/22 0444  PROT 6.9 5.9*  ALBUMIN 3.1* 2.6*  AST  52* 32  ALT 60* 44  ALKPHOS 142* 121  BILITOT 3.1* 1.7*   PT/INR Lab Results  Component Value Date   INR 0.89 03/11/2017   Hepatitis Panel No results for input(s): "HEPBSAG", "HCVAB", "HEPAIGM", "HEPBIGM" in the last 72 hours. C-Diff No components found for: "CDIFF" Lipase     Component Value Date/Time   LIPASE 20 07/28/2022 1556    Drugs of Abuse  No results found for: "LABOPIA", "COCAINSCRNUR", "LABBENZ", "AMPHETMU", "THCU", "LABBARB"   RADIOLOGY STUDIES: US Abdomen Limited RUQ (LIVER/GB)  Result Date: 07/29/2022 CLINICAL DATA:  Right upper quadrant pain for 2 weeks EXAM: ULTRASOUND ABDOMEN LIMITED RIGHT UPPER QUADRANT COMPARISON:  CT abdomen/pelvis 1 day prior FINDINGS: Gallbladder: Shadowing gallstones are seen measuring up to 1.0 cm. There is no gallbladder wall thickening. Sonographic Murphy's sign was reported negative. Common bile duct: Diameter: 14 mm. There is mild intrahepatic biliary ductal dilatation Liver: No focal lesion identified. Within normal limits in parenchymal echogenicity. Portal vein is patent on color Doppler imaging with normal direction of blood flow towards the liver. Other: None. IMPRESSION: 1. Cholelithiasis without evidence of acute cholecystitis. 2. Intra and extrahepatic biliary ductal dilatation suspicious for choledocholithiasis. Electronically Signed   By: Valetta Mole M.D.   On: 07/29/2022 08:26   CT Abdomen Pelvis Wo Contrast  Result Date: 07/28/2022 CLINICAL DATA:  Acute generalized abdominal pain. EXAM: CT ABDOMEN AND PELVIS WITHOUT CONTRAST TECHNIQUE: Multidetector  CT imaging of the abdomen and pelvis was performed following the standard protocol without IV contrast. RADIATION DOSE REDUCTION: This exam was performed according to the departmental dose-optimization program which includes automated exposure control, adjustment of the mA and/or kV according to patient size and/or use of iterative reconstruction technique. COMPARISON:  July 12, 2022.  FINDINGS: Lower chest: No acute abnormality. Hepatobiliary: Liver is unremarkable on these unenhanced images. Minimal cholelithiasis is noted. There appears to be multiple stones in the distal common bile duct resulting in moderate extrahepatic biliary dilatation. Pancreas: Unremarkable. No pancreatic ductal dilatation or surrounding inflammatory changes. Spleen: Normal in size without focal abnormality. Adrenals/Urinary Tract: Adrenal glands are unremarkable. Kidneys are normal, without renal calculi, focal lesion, or hydronephrosis. Bladder is unremarkable. Stomach/Bowel: The stomach is unremarkable. Status post right colectomy. There is no abnormal bowel dilatation or inflammation. Sigmoid diverticulosis is noted without inflammation. Vascular/Lymphatic: Aortic atherosclerosis. No enlarged abdominal or pelvic lymph nodes. Reproductive: Uterus and bilateral adnexa are unremarkable. Other: No abdominal wall hernia or abnormality. No abdominopelvic ascites. Musculoskeletal: No acute or significant osseous findings. IMPRESSION: Choledocholithiasis is noted resulting in moderate extrahepatic biliary dilatation. Minimal cholelithiasis is noted. Status post right colectomy. Sigmoid diverticulosis without inflammation. Aortic Atherosclerosis (ICD10-I70.0). Electronically Signed   By: Marijo Conception M.D.   On: 07/28/2022 17:15      IMPRESSION:      Choledocholithiasis.  ? Cholangitis given significant leukocytosis.  Zosyn in place.  History colon cancer.  Status post right hemicolectomy 1994.  Subsequent surveillance colonoscopies without recurrence of polyps or cancer, last performed 03/2012.    Dementia.      Elevated CK on 8/29.  Has not been rechecked since 8/29.     Hypokalemia, resolved.    [place] anemia.  Hx IDA as indication for prior endo/colonocopies.    PLAN:       ERCP set for 2:15 this afternoon.  Already on antibiotics.  Risks of pancreatitis, perforation, inability to perform procedure,  infection, bleeding were all discussed with the patient's daughter [place].  She is agreeable to proceed.    Hold future doses of SQ heparin until tomorrow morning   Azucena Freed  07/30/2022, 8:21 AM Phone [phone]    Attending Physician Note   I have taken a history, reviewed the chart and examined the patient. I performed a substantive portion of this encounter, including complete performance of at least one of the key components, in conjunction with the APP. I agree with the APP's note, impression and recommendations with my edits. My additional impressions and recommendations are as follows.   *Choledocholithiasis, possible cholangitis. Cholelithiasis. For ERCP, sphincterotomy and stone removal today. Not a good candidate for cholecystectomy. Continue IV Zosyn.   *History of colon cancer. S/P right hemicolectomy in 1994  *Dementia  *Anemia, history of IDA  Lucio Edward, MD [facility] See [facility], [facility], for our on call provider

## 2022-07-30 NOTE — TOC Initial Note (Signed)
Transition of Care [facility]) - Initial/Assessment Note    Patient Details  Name: Glenda Nolan MRN: [id number] Date of Birth: [birth year]  [facility] CM/SW Contact:    Carles Collet, RN Phone Number: 07/30/2022, 12:18 PM  Clinical Narrative:                 Patient admitted from home, lives alone. Recent fall at home, admitted for N/V/D. choledocholithiasis.   Hx dementia  Patient noted to be weak with mobility, updated MD, and placed PT OT evals.  TOC will follow   Expected Discharge Plan: [place] Barriers to Discharge: Continued Medical Work up   Patient Goals and CMS Choice        Expected Discharge Plan and Services Expected Discharge Plan: [place]   Discharge Planning Services: CM Consult   Living arrangements for the past 2 months: Apartment                                      Prior Living Arrangements/Services Living arrangements for the past 2 months: Apartment Lives with:: Self                   Activities of Daily Living      Permission Sought/Granted                  Emotional Assessment              Admission diagnosis:  Choledocholithiasis [K80.50] Diarrhea, unspecified type [R19.7] Patient Active Problem List   Diagnosis Date Noted   Choledocholithiasis 07/28/2022   Dementia ([place]) 07/28/2022   AKI (acute kidney injury) ([place]) 07/28/2022   Diabetes ([place]) 07/28/2022   Hypokalemia 07/28/2022   Spinal stenosis of lumbar region with neurogenic claudication 05/24/2019   Spondylosis without myelopathy or radiculopathy, lumbar region 05/24/2019   S/P TKR (total knee replacement), left 03/18/2017 03/18/2017   Heme positive stool 03/22/2012   Melena 03/22/2012   GERD (gastroesophageal reflux disease) 03/22/2012   CARCINOMA, COLON 04/26/2009   MIGRAINE HEADACHE 04/26/2009   Essential hypertension 04/26/2009   DEGENERATIVE JOINT DISEASE 04/26/2009   IRON DEFICIENCY ANEMIA, HX  OF 04/26/2009   KNEE, ARTHRITIS, DEGEN./OSTEO 03/01/2009   KNEE PAIN 03/01/2009   PCP:  Celene Squibb, MD Pharmacy:   [facility], [place] [address] Phone: [phone] Fax: [phone]     Social Determinants of Health (SDOH) Interventions    Readmission Risk Interventions     No data to display

## 2022-07-30 NOTE — Hospital Course (Signed)
87 year old female with history of dementia, hypertension, diabetes, brought to the hospital with worsening weakness and diarrhea.  Imaging of her abdomen indicated choledocholithiasis with biliary ductal dilatation.  LFTs were noted to be elevated.  She had AKI with elevated creatinine.  Started on IV fluids and intravenous antibiotics.  Case was reviewed with GI and [facility] who agreed with transfer to [facility] for further evaluation

## 2022-07-31 DIAGNOSIS — K8050 Calculus of bile duct without cholangitis or cholecystitis without obstruction: Secondary | ICD-10-CM | POA: Diagnosis not present

## 2022-07-31 LAB — CBC WITH DIFFERENTIAL/PLATELET
Abs Immature Granulocytes: 0.13 10*3/uL — ABNORMAL HIGH (ref 0.00–0.07)
Basophils Absolute: 0 10*3/uL (ref 0.0–0.1)
Basophils Relative: 0 %
Eosinophils Absolute: 0 10*3/uL (ref 0.0–0.5)
Eosinophils Relative: 0 %
HCT: 32.2 % — ABNORMAL LOW (ref 36.0–46.0)
Hemoglobin: 10.8 g/dL — ABNORMAL LOW (ref 12.0–15.0)
Immature Granulocytes: 1 %
Lymphocytes Relative: 9 %
Lymphs Abs: 1.1 10*3/uL (ref 0.7–4.0)
MCH: 30.4 pg (ref 26.0–34.0)
MCHC: 33.5 g/dL (ref 30.0–36.0)
MCV: 90.7 fL (ref 80.0–100.0)
Monocytes Absolute: 0.4 10*3/uL (ref 0.1–1.0)
Monocytes Relative: 4 %
Neutro Abs: 10.1 10*3/uL — ABNORMAL HIGH (ref 1.7–7.7)
Neutrophils Relative %: 86 %
Platelets: 187 10*3/uL (ref 150–400)
RBC: 3.55 MIL/uL — ABNORMAL LOW (ref 3.87–5.11)
RDW: 12.7 % (ref 11.5–15.5)
WBC: 11.8 10*3/uL — ABNORMAL HIGH (ref 4.0–10.5)
nRBC: 0 % (ref 0.0–0.2)

## 2022-07-31 LAB — GLUCOSE, CAPILLARY
Glucose-Capillary: 108 mg/dL — ABNORMAL HIGH (ref 70–99)
Glucose-Capillary: 89 mg/dL (ref 70–99)
Glucose-Capillary: 134 mg/dL — ABNORMAL HIGH (ref 70–99)

## 2022-07-31 LAB — COMPREHENSIVE METABOLIC PANEL
ALT: 27 U/L (ref 0–44)
AST: 20 U/L (ref 15–41)
Albumin: 2.2 g/dL — ABNORMAL LOW (ref 3.5–5.0)
Alkaline Phosphatase: 100 U/L (ref 38–126)
Anion gap: 10 (ref 5–15)
BUN: 15 mg/dL (ref 8–23)
CO2: 22 mmol/L (ref 22–32)
Calcium: 8.4 mg/dL — ABNORMAL LOW (ref 8.9–10.3)
Chloride: 105 mmol/L (ref 98–111)
Creatinine, Ser: 0.96 mg/dL (ref 0.44–1.00)
GFR, Estimated: 58 mL/min — ABNORMAL LOW (ref >60)
Glucose, Bld: 172 mg/dL — ABNORMAL HIGH (ref 70–99)
Potassium: 3.7 mmol/L (ref 3.5–5.1)
Sodium: 137 mmol/L (ref 135–145)
Total Bilirubin: 1.1 mg/dL (ref 0.3–1.2)
Total Protein: 5.3 g/dL — ABNORMAL LOW (ref 6.5–8.1)

## 2022-07-31 LAB — PHOSPHORUS: Phosphorus: 4.1 mg/dL (ref 2.5–4.6)

## 2022-07-31 LAB — MAGNESIUM: Magnesium: 1.7 mg/dL (ref 1.7–2.4)

## 2022-07-31 MED ORDER — GERHARDT'S BUTT CREAM
TOPICAL_CREAM | Freq: Three times a day (TID) | CUTANEOUS | Status: DC | PRN
  Filled 2022-07-31 (×2): qty 1

## 2022-07-31 NOTE — Consult Note (Addendum)
Glenda Nolan 22-Mar-1935  [id number].    Requesting MD: Dr. Florene Glen Chief Complaint/Reason for Consult: Choledocholithiasis   HPI: Glenda Nolan is a 87 y.o. female with a hx of dementia, hypothyroidism hypertension, diabetes, IDA, remote hx Colon CA s/p R hemicolectomy in 1994 who presented to the ED with weakness. Workup was concerning for Choledocholithiasis on CT and [place]. No evidence of Cholecystitis on imaging. WBC elevated at 28 and LFT's also up (AST 52, ALT 60, ALK phos 142, T. Bili 3.1). Lipase wnl. She was started on abx by GI for possible Cholangitis. Per their note "Not a good candidate for cholecystectomy". Underwent ERCP by Dr. Fuller Plan on 9/6. Was found to have Choledocholithiasis with complete removal by biliary sphincterotomy and balloon extraction. WBC down and LFT's normalized post op. We were asked to see. Patient's daughter at bedside. Patient is only orientated to self. Family reports this is baseline. She denies any abdominal pain. Is tolerating liquids after ercp yesterday without n/v.   Lives home alone, walks without cane/walker in home. Family does cooking/shopping. Therapies recommending SNF.  No hx MI or CVA or HF.   ROS: ROS As above, see HPI  Family History  Problem Relation Age of Onset   Leukemia Mother 19   Hypertension Mother    Heart failure Father 14   Coronary artery disease Sister        x2   Stroke Son     Past Medical History:  Diagnosis Date   Anemia    Arthritis    Cataracts, bilateral    pending surgery May 2013   Colon cancer [facility]) 1994   Stage IIB   Diverticulosis    GERD (gastroesophageal reflux disease)    HTN (hypertension)    Hyperlipidemia    Hypothyroidism    Migraines    occasional migraines   Osteoporosis     Past Surgical History:  Procedure Laterality Date   CATARACT EXTRACTION W/PHACO Left 08/01/2013   Procedure: CATARACT EXTRACTION PHACO AND INTRAOCULAR LENS PLACEMENT ([place]);  Surgeon: Tonny Branch, MD;  Location: [facility];  Service: Ophthalmology;  Laterality: Left;  CDE:11.47   CATARACT EXTRACTION W/PHACO Right 08/15/2013   Procedure: CATARACT EXTRACTION PHACO AND INTRAOCULAR LENS PLACEMENT ([facility]);  Surgeon: Tonny Branch, MD;  Location: [facility];  Service: Ophthalmology;  Laterality: Right;  CDE:  16.81   COLON SURGERY  1994   Right   COLONOSCOPY  04/01/2012   Rourk-Colonic ulcers at the anastomosis likely NSAID related, although ischemia is not excluded, status post biopsy, left-sided diverticula.  Internal hemorrhoids and anal papilla   COLONOSCOPY W/ BIOPSIES  05/04/09   Dr. Madolyn Frieze papilla, diverticulosis   DILATION AND CURETTAGE OF UTERUS     ENDOSCOPIC RETROGRADE CHOLANGIOPANCREATOGRAPHY (ERCP) WITH PROPOFOL N/A 07/30/2022   Procedure: ENDOSCOPIC RETROGRADE CHOLANGIOPANCREATOGRAPHY (ERCP) WITH PROPOFOL;  Surgeon: Ladene Artist, MD;  Location: [place];  Service: Gastroenterology;  Laterality: N/A;   ESOPHAGOGASTRODUODENOSCOPY  04/01/2012   [place]k-Normal esophagus, small hiatal hernia, deformity, scarring friability of the antrum/prepyloric mucosa suggestive of prior peptic ulcer disease, normal D1 and D2.   HEMORRHOID SURGERY     knee arthrocopy(Right)     REMOVAL OF STONES  07/30/2022   Procedure: REMOVAL OF STONES;  Surgeon: Ladene Artist, MD;  Location: [place];  Service: Gastroenterology;;   Joan Mayans  07/30/2022   Procedure: AFBXUXYBFXOVAN;  Surgeon: Ladene Artist, MD;  Location: [facility] ENDOSCOPY;  Service: Gastroenterology;;   TOTAL KNEE ARTHROPLASTY Left 03/18/2017  Procedure: TOTAL KNEE ARTHROPLASTY;  Surgeon: Carole Civil, MD;  Location: [facility];  Service: Orthopedics;  Laterality: Left;    Social History:  reports that she quit smoking about 29 years ago. Her smoking use included cigarettes. She has a 5.00 pack-year smoking history. She quit smokeless tobacco use about 29 years ago. She reports current alcohol use. She reports that she does not use drugs.  Allergies: No  Known Allergies  Medications Prior to Admission  Medication Sig Dispense Refill   acetaminophen (TYLENOL) 325 MG tablet Take 325 mg by mouth daily after lunch.      alendronate (FOSAMAX) 70 MG tablet Take 70 mg by mouth every Monday.      aspirin EC 325 MG EC tablet Take 1 tablet (325 mg total) by mouth daily with breakfast. 30 tablet 0   cholecalciferol (VITAMIN D) 1000 UNITS tablet Take 1,000 Units by mouth daily after lunch.      donepezil (ARICEPT) 5 MG tablet Take 10 mg by mouth at bedtime.     furosemide (LASIX) 20 MG tablet Take 40 mg by mouth daily.      levothyroxine (SYNTHROID) 75 MCG tablet Take 75 mcg by mouth every morning.     metFORMIN (GLUCOPHAGE) 500 MG tablet Take 1 tablet by mouth daily.     metoprolol tartrate (LOPRESSOR) 100 MG tablet TAKE (1) TABLET BY MOUTHDAT BREAKFAST THEN TAKE (1) TABLET BY MOUTH AT BEDTIME FOR HEART RATE.     Multiple Vitamins-Minerals (PX SENIOR VITAMIN PO) Take 1 tablet by mouth daily after lunch.      omeprazole (PRILOSEC) 20 MG capsule Take 20 mg by mouth daily after breakfast. (0800)     potassium chloride (MICRO-K) 10 MEQ CR capsule Take 10 mEq by mouth daily.     raloxifene (EVISTA) 60 MG tablet Take 60 mg by mouth daily after breakfast. (0800)     simvastatin (ZOCOR) 40 MG tablet Take 40 mg by mouth at bedtime.      telmisartan (MICARDIS) 80 MG tablet Take 80 mg by mouth daily.      docusate sodium (COLACE) 100 MG capsule Take 1 capsule (100 mg total) by mouth 2 (two) times daily. (Patient not taking: Reported on 07/28/2022) 10 capsule 0     Physical Exam: Blood pressure (!) 154/75, pulse 60, temperature 97.6 F (36.4 C), temperature source Oral, resp. rate 17, height 5' 4"  (1.626 m), weight 65.8 kg, SpO2 99 %. General: pleasant, WD/WN elderly female who is laying in bed in NAD HEENT: head is normocephalic, atraumatic.  Sclera are noninjected.  PERRL.  Ears and nose without any masses or lesions.  Mouth is pink and moist. Dentition  fair Heart: regular, rate, and rhythm.   Lungs: CTAB, no wheezes, rhonchi, or rales noted.  Respiratory effort nonlabored Abd: Soft, ND, NT, +BS. Prior midline scar well healed.  MS: no BUE edema Skin: warm and dry with no masses, lesions, or rashes Psych: A&Ox1 with an appropriate affect Neuro: cranial nerves grossly intact, normal speech, thought process intact, moves all extremities, gait not assessed   Results for orders placed or performed during the hospital encounter of 07/28/22 (from the past 48 hour(s))  CBG monitoring, ED     Status: None   Collection Time: 07/29/22 12:08 PM  Result Value Ref Range   Glucose-Capillary 93 70 - 99 mg/dL    Comment: Glucose reference range applies only to samples taken after fasting for at least 8 hours.  Glucose, capillary  Status: None   Collection Time: 07/29/22  3:51 PM  Result Value Ref Range   Glucose-Capillary 81 70 - 99 mg/dL    Comment: Glucose reference range applies only to samples taken after fasting for at least 8 hours.  Glucose, capillary     Status: None   Collection Time: 07/29/22 11:08 PM  Result Value Ref Range   Glucose-Capillary 80 70 - 99 mg/dL    Comment: Glucose reference range applies only to samples taken after fasting for at least 8 hours.  Glucose, capillary     Status: Abnormal   Collection Time: 07/30/22  5:12 AM  Result Value Ref Range   Glucose-Capillary 50 (L) 70 - 99 mg/dL    Comment: Glucose reference range applies only to samples taken after fasting for at least 8 hours.  Glucose, capillary     Status: Abnormal   Collection Time: 07/30/22  5:48 AM  Result Value Ref Range   Glucose-Capillary 157 (H) 70 - 99 mg/dL    Comment: Glucose reference range applies only to samples taken after fasting for at least 8 hours.  Glucose, capillary     Status: None   Collection Time: 07/30/22  7:26 AM  Result Value Ref Range   Glucose-Capillary 86 70 - 99 mg/dL    Comment: Glucose reference range applies only to  samples taken after fasting for at least 8 hours.  Comprehensive metabolic panel     Status: Abnormal   Collection Time: 07/30/22  7:58 AM  Result Value Ref Range   Sodium 142 135 - 145 mmol/L   Potassium 3.7 3.5 - 5.1 mmol/L   Chloride 112 (H) 98 - 111 mmol/L   CO2 23 22 - 32 mmol/L   Glucose, Bld 96 70 - 99 mg/dL    Comment: Glucose reference range applies only to samples taken after fasting for at least 8 hours.   BUN 19 8 - 23 mg/dL   Creatinine, Ser 1.14 (H) 0.44 - 1.00 mg/dL   Calcium 8.6 (L) 8.9 - 10.3 mg/dL   Total Protein 5.6 (L) 6.5 - 8.1 g/dL   Albumin 2.3 (L) 3.5 - 5.0 g/dL   AST 23 15 - 41 U/L   ALT 32 0 - 44 U/L   Alkaline Phosphatase 117 38 - 126 U/L   Total Bilirubin 1.3 (H) 0.3 - 1.2 mg/dL   GFR, Estimated 47 (L) >60 mL/min    Comment: (NOTE) Calculated using the CKD-EPI Creatinine Equation (2021)    Anion gap 7 5 - 15    Comment: Performed at [facility], [place] [address], [place], [place] [address]  CBC     Status: Abnormal   Collection Time: 07/30/22  7:58 AM  Result Value Ref Range   WBC 12.5 (H) 4.0 - 10.5 K/uL   RBC 3.65 (L) 3.87 - 5.11 MIL/uL   Hemoglobin 10.9 (L) 12.0 - 15.0 g/dL   HCT 33.7 (L) 36.0 - 46.0 %   MCV 92.3 80.0 - 100.0 fL   MCH 29.9 26.0 - 34.0 pg   MCHC 32.3 30.0 - 36.0 g/dL   RDW 13.1 11.5 - 15.5 %   Platelets 186 150 - 400 K/uL   nRBC 0.0 0.0 - 0.2 %    Comment: Performed at [facility], [place] [address] [place], [place] [address]  Glucose, capillary     Status: None   Collection Time: 07/30/22 11:27 AM  Result Value Ref Range   Glucose-Capillary 70 70 -  99 mg/dL    Comment: Glucose reference range applies only to samples taken after fasting for at least 8 hours.  Glucose, capillary     Status: Abnormal   Collection Time: 07/30/22  1:28 PM  Result Value Ref Range   Glucose-Capillary 66 (L) 70 - 99 mg/dL    Comment: Glucose reference range applies only to samples taken after fasting for at least 8 hours.  Glucose,  capillary     Status: Abnormal   Collection Time: 07/30/22  1:52 PM  Result Value Ref Range   Glucose-Capillary 131 (H) 70 - 99 mg/dL    Comment: Glucose reference range applies only to samples taken after fasting for at least 8 hours.  Glucose, capillary     Status: Abnormal   Collection Time: 07/30/22  3:43 PM  Result Value Ref Range   Glucose-Capillary 116 (H) 70 - 99 mg/dL    Comment: Glucose reference range applies only to samples taken after fasting for at least 8 hours.  Glucose, capillary     Status: Abnormal   Collection Time: 07/30/22  5:49 PM  Result Value Ref Range   Glucose-Capillary 116 (H) 70 - 99 mg/dL    Comment: Glucose reference range applies only to samples taken after fasting for at least 8 hours.  Glucose, capillary     Status: Abnormal   Collection Time: 07/30/22  8:01 PM  Result Value Ref Range   Glucose-Capillary 268 (H) 70 - 99 mg/dL    Comment: Glucose reference range applies only to samples taken after fasting for at least 8 hours.  Glucose, capillary     Status: Abnormal   Collection Time: 07/30/22 11:12 PM  Result Value Ref Range   Glucose-Capillary 175 (H) 70 - 99 mg/dL    Comment: Glucose reference range applies only to samples taken after fasting for at least 8 hours.  CBC with Differential/Platelet     Status: Abnormal   Collection Time: 07/31/22 12:25 AM  Result Value Ref Range   WBC 11.8 (H) 4.0 - 10.5 K/uL   RBC 3.55 (L) 3.87 - 5.11 MIL/uL   Hemoglobin 10.8 (L) 12.0 - 15.0 g/dL   HCT 32.2 (L) 36.0 - 46.0 %   MCV 90.7 80.0 - 100.0 fL   MCH 30.4 26.0 - 34.0 pg   MCHC 33.5 30.0 - 36.0 g/dL   RDW 12.7 11.5 - 15.5 %   Platelets 187 150 - 400 K/uL   nRBC 0.0 0.0 - 0.2 %   Neutrophils Relative % 86 %   Neutro Abs 10.1 (H) 1.7 - 7.7 K/uL   Lymphocytes Relative 9 %   Lymphs Abs 1.1 0.7 - 4.0 K/uL   Monocytes Relative 4 %   Monocytes Absolute 0.4 0.1 - 1.0 K/uL   Eosinophils Relative 0 %   Eosinophils Absolute 0.0 0.0 - 0.5 K/uL   Basophils  Relative 0 %   Basophils Absolute 0.0 0.0 - 0.1 K/uL   Immature Granulocytes 1 %   Abs Immature Granulocytes 0.13 (H) 0.00 - 0.07 K/uL    Comment: Performed at [facility], [address], [place], [place] [address]  Comprehensive metabolic panel     Status: Abnormal   Collection Time: 07/31/22 12:25 AM  Result Value Ref Range   Sodium 137 135 - 145 mmol/L   Potassium 3.7 3.5 - 5.1 mmol/L   Chloride 105 98 - 111 mmol/L   CO2 22 22 - 32 mmol/L   Glucose, Bld 172 (H) 70 -  99 mg/dL    Comment: Glucose reference range applies only to samples taken after fasting for at least 8 hours.   BUN 15 8 - 23 mg/dL   Creatinine, Ser 0.96 0.44 - 1.00 mg/dL   Calcium 8.4 (L) 8.9 - 10.3 mg/dL   Total Protein 5.3 (L) 6.5 - 8.1 g/dL   Albumin 2.2 (L) 3.5 - 5.0 g/dL   AST 20 15 - 41 U/L   ALT 27 0 - 44 U/L   Alkaline Phosphatase 100 38 - 126 U/L   Total Bilirubin 1.1 0.3 - 1.2 mg/dL   GFR, Estimated 58 (L) >60 mL/min    Comment: (NOTE) Calculated using the CKD-EPI Creatinine Equation (2021)    Anion gap 10 5 - 15    Comment: Performed at [facility], [place] [address] [place], [place] [address]  Magnesium     Status: None   Collection Time: 07/31/22 12:25 AM  Result Value Ref Range   Magnesium 1.7 1.7 - 2.4 mg/dL    Comment: Performed at [facility], [place] [address] [place], [place] [address]  Phosphorus     Status: None   Collection Time: 07/31/22 12:25 AM  Result Value Ref Range   Phosphorus 4.1 2.5 - 4.6 mg/dL    Comment: Performed at [facility], [place] [address], [place], [place] [address]  Glucose, capillary     Status: Abnormal   Collection Time: 07/31/22  6:07 AM  Result Value Ref Range   Glucose-Capillary 108 (H) 70 - 99 mg/dL    Comment: Glucose reference range applies only to samples taken after fasting for at least 8 hours.  Glucose, capillary     Status: None   Collection Time: 07/31/22  7:28 AM  Result Value Ref Range   Glucose-Capillary 89 70 - 99  mg/dL    Comment: Glucose reference range applies only to samples taken after fasting for at least 8 hours.   DG ERCP  Result Date: 07/30/2022 CLINICAL DATA:  ERCP for choledocholithiasis. EXAM: ERCP TECHNIQUE: Multiple spot images obtained with the fluoroscopic device and submitted for interpretation post-procedure. FLUOROSCOPY TIME: FLUOROSCOPY TIME 3 minutes, 6 seconds (26.7 mGy) COMPARISON:  CT abdomen and pelvis-07/28/2022 FINDINGS: 16 spot intraoperative fluoroscopic images the right upper abdominal quadrant during ERCP are provided for review Initial image demonstrates ERCP probe overlying the right upper abdominal quadrant. Multiple surgical clips overlie the right mid hemiabdomen Subsequent images demonstrate selective cannulation and opacification of the common bile duct which appears moderate to markedly dilated. There are 2 discrete filling defects within distal aspect of the CBD compatible with provided history of choledocholithiasis. Subsequent images demonstrate insufflation of a balloon within distal aspect of the CBD with subsequent biliary sweeping and presumed stone extraction and sphincterotomy. IMPRESSION: ERCP with findings of choledocholithiasis with subsequent biliary sweeping and presumed stone extraction and sphincterotomy as above. These images were submitted for radiologic interpretation only. Please see the procedural report for the amount of contrast and the fluoroscopy time utilized. Electronically Signed   By: Sandi Mariscal M.D.   On: 07/30/2022 16:08    Anti-infectives (From admission, onward)    Start     Dose/Rate Route Frequency Ordered Stop   07/29/22 1100  piperacillin-tazobactam (ZOSYN) IVPB 3.375 g        3.375 g 12.5 mL/hr over 240 Minutes Intravenous Every 8 hours 07/29/22 1039     07/29/22 1030  piperacillin-tazobactam (ZOSYN) 2.25 g in sodium chloride 0.9 % 50 mL  IVPB  Status:  Discontinued        2.25 g 100 mL/hr over 30 Minutes Intravenous Every 8 hours  07/29/22 1009 07/29/22 1039   07/28/22 1800  piperacillin-tazobactam (ZOSYN) IVPB 2.25 g  Status:  Discontinued        2.25 g 100 mL/hr over 30 Minutes Intravenous Every 8 hours 07/28/22 1750 07/29/22 1009       Assessment/Plan Cholelithiasis with Choledocholithiasis  - Patient seen and examined. Vitals, labs, imaging, I/O, and notes reviewed. This is a 87 y.o. female who was found to have Choledocholithiasis and underwent ERCP with biliary sphincterotomy and balloon extraction on 9/6. She is being covered with abx for possible Cholangitis. There was no evidence of Cholecystitis on her imaging. Her WBC has improved and LFT's normalized since ERCP. She is pain free, tolerating po and NT on exam. We were asked to see for possible Cholecystectomy. Discussed both the risk of recurrence with the family (recurrent Choledocholithiasis, possible Cholangitis, biliary pancreatitis and possible complications from this, Cholecystitis, Sepsis, Death etc) along with risks and benefits of Cholecystectomy. After discussion they would like to hold off on any surgery at this time and seem to understand risk. I think this is very reasonable. We will be available as needed moving forward. Thank you for the consult.   FEN - ADAT VTE - SCDs, okay for chemical ppx from a general surgery standpoint ID - Zosyn 07/28/22  Hx of Dementia Hx of Hypothyroidism Hx of hypertension Hx of diabetes Hx of IDA Remote hx Colon CA s/p R hemicolectomy   Jillyn Ledger, [facility] 07/31/2022, 9:49 AM Please see Amion for pager number during day hours 7:00am-4:30pm

## 2022-07-31 NOTE — Progress Notes (Signed)
PROGRESS NOTE    Glenda Nolan  IOE:[id number] DOB: 1935/09/11 DOA: 07/28/2022 PCP: Celene Squibb, MD  Chief Complaint  Patient presents with   Diarrhea    Brief Narrative:  87 year old female with history of dementia, hypertension, diabetes, brought to the hospital with worsening weakness and diarrhea.  Imaging of her abdomen indicated choledocholithiasis with biliary ductal dilatation.  LFTs were noted to be elevated.  She had AKI with elevated creatinine.  Started on IV fluids and intravenous antibiotics.  Case was reviewed with GI and [facility] who agreed with transfer to [facility] for further evaluation     Assessment & Plan:   Active Problems:   Choledocholithiasis   Diarrhea   AKI (acute kidney injury) ([place])   Essential hypertension   Dementia (HCC)   Diabetes ([place])   Hypokalemia   Assessment and Plan: Choledocholithiasis CT abd/pelvis with choledocholithiasis resulting in moderate extrahepatic biliary dilatation, minimal cholelithiasis RUQ [place] with cholelithiasis without cholecystitis Intra and extrahepatic biliary ductal dilatation concerning for choledocholithiasis  LFT's overall improved WBC 28 on presentation, improving Continue antibiotics Appreciate GI, s/p ERCP with biliary sphicterotomy and balloon extraction, distal esophageal stricture, periampullatory diverticulum.  Recommending avoiding aspirin, NSAIDs x2 weeks, abx x7 days for cholangitis.   General surgery c/s, risks thought to outweigh benefit Lives home alone, walks without cane/walker in home (though she has them and is supposed to use them).  Dresses herself, needs help with bathing.  No hx MI or CVA or HF.    Diarrhea C diff pending  AKI (acute kidney injury) ([place]) Peaked at 2.44 Improving Hold lasix and telmisartan   Essential hypertension Stable. -Hold telmisartan and Lasix for now with AKI -Resume metoprolol  Dementia (HCC) Donepezil Delirium precautions  Diabetes (HCC) Hgba1c  6.8 Hold metformin SSi- S q6h    Hypokalemia Replace and follow     DVT prophylaxis: heparin Code Status: full Family Communication: 2 daughters at bedside Disposition:   Status is: Inpatient Remains inpatient appropriate because: need for continued procedures   Consultants:  GI  Procedures:  ERCP  Antimicrobials:  Anti-infectives (From admission, onward)    Start     Dose/Rate Route Frequency Ordered Stop   07/29/22 1100  piperacillin-tazobactam (ZOSYN) IVPB 3.375 g        3.375 g 12.5 mL/hr over 240 Minutes Intravenous Every 8 hours 07/29/22 1039     07/29/22 1030  piperacillin-tazobactam (ZOSYN) 2.25 g in sodium chloride 0.9 % 50 mL IVPB  Status:  Discontinued        2.25 g 100 mL/hr over 30 Minutes Intravenous Every 8 hours 07/29/22 1009 07/29/22 1039   07/28/22 1800  piperacillin-tazobactam (ZOSYN) IVPB 2.25 g  Status:  Discontinued        2.25 g 100 mL/hr over 30 Minutes Intravenous Every 8 hours 07/28/22 1750 07/29/22 1009       Subjective: Pleasantly confused No complaints  Objective: Vitals:   07/30/22 1744 07/30/22 1933 07/31/22 0604 07/31/22 1318  BP: (!) 152/65 139/65 (!) 154/75 129/64  Pulse: (!) 59 71 60 (!) 56  Resp: '18 17  16  '$ Temp: 98.2 F (36.8 C) 97.8 F (36.6 C) 97.6 F (36.4 C) 98.2 F (36.8 C)  TempSrc: Oral Oral Oral Oral  SpO2: 100% 99% 99% 100%  Weight:      Height:        Intake/Output Summary (Last 24 hours) at 07/31/2022 1647 Last data filed at 07/31/2022 1452 Gross per 24 hour  Intake 1461.17 ml  Output 500 ml  Net 961.17 ml   Filed Weights   07/28/22 1143  Weight: 65.8 kg    Examination:  General: No acute distress. Cardiovascular: RRR Lungs: unlabored Abdomen: Soft, nontender, nondistended Neurological: delirious, moving all extremities, pleasantly confused Extremities: No clubbing or cyanosis. No edema.  Data Reviewed: I have personally reviewed following labs and imaging studies  CBC: Recent Labs  Lab  07/28/22 1556 07/29/22 0444 07/30/22 0758 07/31/22 0025  WBC 28.0* 19.9* 12.5* 11.8*  NEUTROABS 24.2*  --   --  10.1*  HGB 11.5* 10.7* 10.9* 10.8*  HCT 34.5* 32.6* 33.7* 32.2*  MCV 91.8 92.4 92.3 90.7  PLT 174 158 186 585    Basic Metabolic Panel: Recent Labs  Lab 07/28/22 1556 07/29/22 0444 07/30/22 0758 07/31/22 0025  NA 144 144 142 137  K 3.4* 3.2* 3.7 3.7  CL 106 113* 112* 105  CO2 '26 25 23 22  '$ GLUCOSE 117* 118* 96 172*  BUN 32* 30* 19 15  CREATININE 2.44* 1.51* 1.14* 0.96  CALCIUM 7.9* 8.1* 8.6* 8.4*  MG 2.3  --   --  1.7  PHOS  --   --   --  4.1    GFR: Estimated Creatinine Clearance: 40 mL/min (by C-G formula based on SCr of 0.96 mg/dL).  Liver Function Tests: Recent Labs  Lab 07/28/22 1556 07/29/22 0444 07/30/22 0758 07/31/22 0025  AST 52* 32 23 20  ALT 60* 44 32 27  ALKPHOS 142* 121 117 100  BILITOT 3.1* 1.7* 1.3* 1.1  PROT 6.9 5.9* 5.6* 5.3*  ALBUMIN 3.1* 2.6* 2.3* 2.2*    CBG: Recent Labs  Lab 07/30/22 1749 07/30/22 2001 07/30/22 2312 07/31/22 0607 07/31/22 0728  GLUCAP 116* 268* 175* 108* 89     Recent Results (from the past 240 hour(s))  Culture, blood (Routine X 2) w Reflex to ID Panel     Status: None (Preliminary result)   Collection Time: 07/28/22  9:22 PM   Specimen: BLOOD  Result Value Ref Range Status   Specimen Description BLOOD LEFT ANTECUBITAL  Final   Special Requests   Final    BOTTLES DRAWN AEROBIC AND ANAEROBIC Blood Culture adequate volume   Culture   Final    NO GROWTH 3 DAYS Performed at [facility], [address] [place], [place] [address]    Report Status PENDING  Incomplete  Culture, blood (Routine X 2) w Reflex to ID Panel     Status: None (Preliminary result)   Collection Time: 07/28/22  9:22 PM   Specimen: BLOOD  Result Value Ref Range Status   Specimen Description BLOOD BLOOD LEFT WRIST  Final   Special Requests   Final    BOTTLES DRAWN AEROBIC AND ANAEROBIC Blood Culture adequate volume   Culture    Final    NO GROWTH 3 DAYS Performed at [facility], [address], [place], [place] [address]    Report Status PENDING  Incomplete         Radiology Studies: DG ERCP  Result Date: 07/30/2022 CLINICAL DATA:  ERCP for choledocholithiasis. EXAM: ERCP TECHNIQUE: Multiple spot images obtained with the fluoroscopic device and submitted for interpretation post-procedure. FLUOROSCOPY TIME: FLUOROSCOPY TIME 3 minutes, 6 seconds (26.7 mGy) COMPARISON:  CT abdomen and pelvis-07/28/2022 FINDINGS: 16 spot intraoperative fluoroscopic images the right upper abdominal quadrant during ERCP are provided for review Initial image demonstrates ERCP probe overlying the right upper abdominal quadrant. Multiple surgical clips overlie the right mid hemiabdomen Subsequent images demonstrate  selective cannulation and opacification of the common bile duct which appears moderate to markedly dilated. There are 2 discrete filling defects within distal aspect of the CBD compatible with provided history of choledocholithiasis. Subsequent images demonstrate insufflation of Eloise Picone balloon within distal aspect of the CBD with subsequent biliary sweeping and presumed stone extraction and sphincterotomy. IMPRESSION: ERCP with findings of choledocholithiasis with subsequent biliary sweeping and presumed stone extraction and sphincterotomy as above. These images were submitted for radiologic interpretation only. Please see the procedural report for the amount of contrast and the fluoroscopy time utilized. Electronically Signed   By: Sandi Mariscal M.D.   On: 07/30/2022 16:08        Scheduled Meds:  donepezil  10 mg Oral QHS   heparin  5,000 Units Subcutaneous Q8H   insulin aspart  0-9 Units Subcutaneous Q6H   levothyroxine  75 mcg Oral Q0600   metoprolol tartrate  100 mg Oral BID   pantoprazole  40 mg Oral Daily   raloxifene  60 mg Oral QPC breakfast   Continuous Infusions:  lactated ringers 100 mL/hr at 07/31/22 0608    piperacillin-tazobactam (ZOSYN)  IV 3.375 g (07/31/22 1020)     LOS: 3 days    Time spent: over 30 min    Fayrene Helper, MD [facility]   To contact the attending provider between 7A-7P or the covering provider during after hours 7P-7A, please log into the web site [email or web address] and access using universal [place] password for that web site. If you do not have the password, please call the hospital operator.  07/31/2022, 4:47 PM

## 2022-07-31 NOTE — Anesthesia Postprocedure Evaluation (Addendum)
Anesthesia Post Note  Patient: Glenda Nolan  Procedure(s) Performed: ENDOSCOPIC RETROGRADE CHOLANGIOPANCREATOGRAPHY (ERCP) WITH PROPOFOL SPHINCTEROTOMY REMOVAL OF STONES     Patient location during evaluation: PACU Anesthesia Type: General Level of consciousness: awake and alert Pain management: pain level controlled Vital Signs Assessment: post-procedure vital signs reviewed and stable Respiratory status: spontaneous breathing Cardiovascular status: stable Anesthetic complications: no   No notable events documented.  Last Vitals:  Vitals:   07/31/22 0604 07/31/22 1318  BP: (!) 154/75 129/64  Pulse: 60 (!) 56  Resp:  16  Temp: 36.4 C 36.8 C  SpO2: 99% 100%    Last Pain:  Vitals:   07/31/22 1318  TempSrc: Oral  PainSc:                  Nolon Nations

## 2022-07-31 NOTE — Evaluation (Signed)
Physical Therapy Evaluation Patient Details Name: Glenda Nolan MRN: [id number] DOB: 07/02/1935 Today's Date: 07/31/2022  History of Present Illness  87 year old female admitted for choledocholithiasis with biliary ductal dilatation and AKI. ERCP, sphincterotomy and stone removal on 9/6. PMH: dementia, anemia, hx of colon CA, diverticulosis.  Clinical Impression  Pt admitted with above diagnosis. Pt was able to ambulate a short distance with RW needing guard assist and cues for safety. Pt had more difficulty with bed mobility with definite need of assist if pt was to go homea nd pt was indepnedent PTA.  Will need SNF if family cant provide 24 hour care intiially.  Daughter agrees.   Pt currently with functional limitations due to the deficits listed below (see PT Problem List). Pt will benefit from skilled PT to increase their independence and safety with mobility to allow discharge to the venue listed below.          Recommendations for follow up therapy are one component of a multi-disciplinary discharge planning process, led by the attending physician.  Recommendations may be updated based on patient status, additional functional criteria and insurance authorization.  Follow Up Recommendations Skilled nursing-short term rehab (<3 hours/day) Can patient physically be transported by private vehicle: Yes    Assistance Recommended at Discharge Frequent or constant Supervision/Assistance  Patient can return home with the following  A lot of help with walking and/or transfers;A lot of help with bathing/dressing/bathroom;Assistance with cooking/housework;Assist for transportation;Help with stairs or ramp for entrance;Direct supervision/assist for medications management;Direct supervision/assist for financial management    Equipment Recommendations None recommended by PT  Recommendations for Other Services       Functional Status Assessment       Precautions / Restrictions  Precautions Precautions: Fall Restrictions Weight Bearing Restrictions: No      Mobility  Bed Mobility Overal bed mobility: Needs Assistance Bed Mobility: Supine to Sit, Sit to Supine, Rolling Rolling: Min assist   Supine to sit: HOB elevated, Mod assist     General bed mobility comments: heavy assist to sequence to EOB with pt leaning heavily posteriorly taking incr time to get pt to sit up and not be supported.  Pt appeared to have pain and  that was limiting her.    Transfers Overall transfer level: Needs assistance Equipment used: Rolling walker (2 wheels) Transfers: Sit to/from Stand Sit to Stand: Mod assist           General transfer comment: heavy Mod A to stand with RW    Ambulation/Gait Ambulation/Gait assistance: Min guard, Min assist Gait Distance (Feet): 40 Feet Assistive device: Rolling walker (2 wheels) Gait Pattern/deviations: Step-through pattern, Decreased stride length, Trunk flexed   Gait velocity interpretation: <1.31 ft/sec, indicative of household ambulator   General Gait Details: Pt able to transfer onto 3N1 as she had loose stool upon standing. Then she ambulated around bed to chair. Once up, pt more steady with RW but needs guard assist and cues for safety at current level.  Stairs            Wheelchair Mobility    Modified Rankin (Stroke Patients Only)       Balance Overall balance assessment: Needs assistance Sitting-balance support: Feet supported, Bilateral upper extremity supported Sitting balance-Leahy Scale: Poor Sitting balance - Comments: reliant on UE support EOB w/ posterior lean when not using BUE Postural control: Posterior lean Standing balance support: Bilateral upper extremity supported Standing balance-Leahy Scale: Poor Standing balance comment: relies on RW and external support  Pertinent Vitals/Pain Pain Assessment Pain Assessment: Faces Faces Pain Scale: Hurts  little more Breathing: normal Negative Vocalization: none Facial Expression: smiling or inexpressive Body Language: relaxed Consolability: no need to console PAINAD Score: 0 Pain Location: unable to specify but kept reporting soreness (pointing to lower abdominal region at one point) Pain Descriptors / Indicators: Grimacing, Guarding Pain Intervention(s): Limited activity within patient's tolerance, Monitored during session, Repositioned    Home Living Family/patient expects to be discharged to:: Other (Comment) (Independent Living/senior apartments) Living Arrangements: Alone Available Help at Discharge: Family;Available PRN/intermittently (intermittent checks daily, camera in apartment) Type of Home: Apartment Home Access: Ramped entrance       Home Layout: One level Home Equipment: Cane - single point;Rollator (4 wheels);Standard Walker;BSC/3in1;Tub bench;Grab bars - tub/shower      Prior Function Prior Level of Function : Needs assist             Mobility Comments: reports sometimes using a walker for mobility ADLs Comments: Reports Modified Independent with ADLs (sponge bathes at baseline). family assist with IADLs; daughter states that pt always had help wtih bathing     Hand Dominance   Dominant Hand: Right    Extremity/Trunk Assessment                Communication      Cognition Arousal/Alertness: Awake/alert Behavior During Therapy: WFL for tasks assessed/performed, Flat affect Overall Cognitive Status: History of cognitive impairments - at baseline Area of Impairment: Attention, Orientation, Memory, Following commands, Safety/judgement, Awareness, Problem solving                 Orientation Level: Disoriented to, Time, Situation Current Attention Level: Sustained Memory: Decreased short-term memory Following Commands: Follows one step commands with increased time Safety/Judgement: Decreased awareness of safety, Decreased awareness of  deficits Awareness: Intellectual Problem Solving: Slow processing, Decreased initiation, Difficulty sequencing, Requires verbal cues, Requires tactile cues General Comments: hx of dementia at baseline, decreased ability to recall reason for being in hospital. slow processing and difficulty sequencing, mumbled speech so difficult to fully assess. Pt's daughter entering at end of session and pt calling her daughter her sister        General Comments General comments (skin integrity, edema, etc.): Daughter present    Exercises General Exercises - Lower Extremity Ankle Circles/Pumps: AROM, Both, 5 reps, Supine Long Arc Quad: AROM, Both, 10 reps, Seated Hip Flexion/Marching: AROM, Both, 10 reps, Seated   Assessment/Plan    PT Assessment Patient needs continued PT services  PT Problem List Decreased activity tolerance;Decreased balance;Decreased mobility;Decreased knowledge of use of DME;Decreased safety awareness;Decreased knowledge of precautions       PT Treatment Interventions DME instruction;Gait training;Functional mobility training;Therapeutic activities;Therapeutic exercise;Balance training;Patient/family education    PT Goals (Current goals can be found in the Care Plan section)  Acute Rehab PT Goals Patient Stated Goal: to go home PT Goal Formulation: With patient Time For Goal Achievement: 08/14/22 Potential to Achieve Goals: Good    Frequency Min 3X/week     Co-evaluation               AM-PAC PT "6 Clicks" Mobility  Outcome Measure Help needed turning from your back to your side while in a flat bed without using bedrails?: A Lot Help needed moving from lying on your back to sitting on the side of a flat bed without using bedrails?: A Lot Help needed moving to and from a bed to a chair (including a wheelchair)?: A Little Help needed standing  up from a chair using your arms (e.g., wheelchair or bedside chair)?: A Little Help needed to walk in hospital room?: A  Little Help needed climbing 3-5 steps with a railing? : A Lot 6 Click Score: 15    End of Session Equipment Utilized During Treatment: Gait belt Activity Tolerance: Patient limited by fatigue Patient left: in chair;with call bell/phone within reach;with chair alarm set;with family/visitor present Nurse Communication: Mobility status PT Visit Diagnosis: Muscle weakness (generalized) (M62.81)    Time: 0263-7858 PT Time Calculation (min) (ACUTE ONLY): 24 min   Charges:   PT Evaluation $PT Eval Moderate Complexity: 1 Mod PT Treatments $Gait Training: 8-22 mins        Brandee Markin M,PT Acute Rehab Services [phone]   Alvira Philips 07/31/2022, 1:23 PM

## 2022-07-31 NOTE — Progress Notes (Addendum)
Occupational Therapy Evaluation Patient Details Name: Glenda Nolan MRN: [id number] DOB: 29-Jul-1935 Today's Date: 07/31/2022   History of Present Illness 87 year old female admitted for choledocholithiasis with biliary ductal dilatation and AKI. ERCP, sphincterotomy and stone removal on 9/6. PMH: dementia, anemia, hx of colon CA, diverticulosis.   Clinical Impression   PTA, pt lives alone, typically Modified Independent with ADLs/mobility with occasional use of RW. Daughter entered at end of session, confirms PLOF, assistance with IADLs though unsure if pt in senior apartment complex vs ILF. Pt presents now with deficits in strength, standing balance, endurance and cognition. Session limited by continuous bowel incontinence. Overall, pt requires Mod-Max A for bed mobility and Mod A for brief standing with RW at bedside. Pt requires Min A for UB ADLs and Max-Total A for LB ADLs d/t deficits. Based on current presentation, recommend SNF rehab at [place].      Recommendations for follow up therapy are one component of a multi-disciplinary discharge planning process, led by the attending physician.  Recommendations may be updated based on patient status, additional functional criteria and insurance authorization.   Follow Up Recommendations  Skilled nursing-short term rehab (<3 hours/day)    Assistance Recommended at Discharge Frequent or constant Supervision/Assistance  Patient can return home with the following A lot of help with walking and/or transfers;A lot of help with bathing/dressing/bathroom    Functional Status Assessment  Patient has had a recent decline in their functional status and demonstrates the ability to make significant improvements in function in a reasonable and predictable amount of time.  Equipment Recommendations  BSC/3in1    Recommendations for Other Services       Precautions / Restrictions Precautions Precautions: Fall Restrictions Weight Bearing Restrictions: No       Mobility Bed Mobility Overal bed mobility: Needs Assistance Bed Mobility: Supine to Sit, Sit to Supine, Rolling Rolling: Min assist   Supine to sit: Max assist, HOB elevated Sit to supine: Mod assist   General bed mobility comments: heavy assist to sequence to EOB but able to assist guiding trunk back to bed at end of session. Min A to roll for cleanup and pad change    Transfers Overall transfer level: Needs assistance Equipment used: Rolling walker (2 wheels) Transfers: Sit to/from Stand Sit to Stand: Mod assist           General transfer comment: heavy Mod A to stand with RW, unable to sustain standing d/t bowel incontinence      Balance Overall balance assessment: Needs assistance Sitting-balance support: Feet supported, Bilateral upper extremity supported Sitting balance-Leahy Scale: Poor Sitting balance - Comments: reliant on UE support EOB w/ posterior lean when not using BUE Postural control: Posterior lean Standing balance support: Bilateral upper extremity supported Standing balance-Leahy Scale: Poor                             ADL either performed or assessed with clinical judgement   ADL Overall ADL's : Needs assistance/impaired Eating/Feeding: Set up   Grooming: Supervision/safety;Sitting   Upper Body Bathing: Minimal assistance;Sitting   Lower Body Bathing: Maximal assistance;Sit to/from stand   Upper Body Dressing : Minimal assistance;Sitting   Lower Body Dressing: Total assistance;Sit to/from stand       Toileting- Water quality scientist and Hygiene: Total assistance;Bed level Toileting - Clothing Manipulation Details (indicate cue type and reason): constant bowel incontinence with movement       General ADL Comments: Significant weakness, impaired  cognition (not sure how far from baseline), and constant bowel incontinence with movement     Vision Baseline Vision/History: 1 Wears glasses Ability to See in Adequate Light:  0 Adequate Patient Visual Report: No change from baseline Vision Assessment?: No apparent visual deficits Additional Comments: able to easily read signs in room     Perception     Praxis      Pertinent Vitals/Pain Pain Assessment Pain Assessment: Faces Faces Pain Scale: Hurts little more Pain Location: unable to specify but kept reporting soreness (pointing to lower abdominal region at one point) Pain Descriptors / Indicators: Grimacing, Guarding Pain Intervention(s): Monitored during session     Hand Dominance Right   Extremity/Trunk Assessment Upper Extremity Assessment Upper Extremity Assessment: Generalized weakness;Difficult to assess due to impaired cognition   Lower Extremity Assessment Lower Extremity Assessment: Defer to PT evaluation   Cervical / Trunk Assessment Cervical / Trunk Assessment: Kyphotic   Communication Communication Communication: Expressive difficulties;Other (comment) (word finding difficulties, some mumbled speech)   Cognition Arousal/Alertness: Awake/alert Behavior During Therapy: WFL for tasks assessed/performed, Flat affect Overall Cognitive Status: History of cognitive impairments - at baseline Area of Impairment: Attention, Orientation, Memory, Following commands, Safety/judgement, Awareness, Problem solving                 Orientation Level: Disoriented to, Time, Situation Current Attention Level: Sustained Memory: Decreased short-term memory Following Commands: Follows one step commands with increased time Safety/Judgement: Decreased awareness of safety, Decreased awareness of deficits Awareness: Intellectual Problem Solving: Slow processing, Decreased initiation, Difficulty sequencing, Requires verbal cues, Requires tactile cues General Comments: hx of dementia at baseline, decreased ability to recall reason for being in hospital. slow processing and difficulty sequencing, mumbled speech so difficult to fully assess. Pt's daughter  entering at end of session and pt calling her daughter her mom     General Comments  Daughter entering at end of session though reports another daughter is main person involved in pt care - unsure of assist able to be provided at home vs going to rehab, etc.    Exercises     Shoulder Instructions      Home Living Family/patient expects to be discharged to:: Other (Comment) (Independent Living/senior apartments)                                 Additional Comments: handicapped accessible apartment - daughter present unsure if senior apartment vs ILF...initially said ALF      Prior Functioning/Environment Prior Level of Function : Needs assist;Patient poor historian/Family not available             Mobility Comments: reports sometimes using a walker for mobility ADLs Comments: Reports Modified Independent with ADLs (sponge bathes at baseline). family assist with IADLs        OT Problem List: Decreased strength;Decreased activity tolerance;Impaired balance (sitting and/or standing);Decreased cognition;Decreased safety awareness;Decreased knowledge of use of DME or AE      OT Treatment/Interventions: Self-care/ADL training;Therapeutic exercise;Energy conservation;DME and/or AE instruction;Therapeutic activities;Patient/family education;Balance training    OT Goals(Current goals can be found in the care plan section) Acute Rehab OT Goals Patient Stated Goal: to feel better OT Goal Formulation: With patient/family Time For Goal Achievement: 08/14/22 Potential to Achieve Goals: Good  OT Frequency: Min 2X/week    Co-evaluation              AM-PAC OT "6 Clicks" Daily Activity  Outcome Measure Help from another person eating meals?: A Little Help from another person taking care of personal grooming?: A Little Help from another person toileting, which includes using toliet, bedpan, or urinal?: Total Help from another person bathing (including washing,  rinsing, drying)?: A Lot Help from another person to put on and taking off regular upper body clothing?: A Little Help from another person to put on and taking off regular lower body clothing?: Total 6 Click Score: 13   End of Session Equipment Utilized During Treatment: Rolling walker (2 wheels) Nurse Communication: Mobility status  Activity Tolerance: Treatment limited secondary to medical complications (Comment) Patient left: in bed;with call bell/phone within reach;with bed alarm set;with family/visitor present;Other (comment) (mitts reapplied)  OT Visit Diagnosis: Unsteadiness on feet (R26.81);Other abnormalities of gait and mobility (R26.89);Muscle weakness (generalized) (M62.81)                Time: 2637-8588 OT Time Calculation (min): 24 min Charges:  OT General Charges $OT Visit: 1 Visit OT Evaluation $OT Eval Moderate Complexity: 1 Mod OT Treatments $Self Care/Home Management : 8-22 mins  Malachy Chamber, OTR/L Acute Rehab Services Office: [phone]   Layla Maw 07/31/2022, 9:40 AM

## 2022-07-31 NOTE — Progress Notes (Addendum)
Progress Note  Primary GI: [facility]   Subjective  Chief Complaint: Choledocholithiasis  Patient sitting up in chair, daughter at bedside. Patient with dementia, but denies nausea, vomiting abdominal pain. Tolerating liquid diet well. Discussed with nurse, states she has had 1 very small volume loose stool this morning and last night at 1 AM.  Yesterday had 2 loose bowel movements. Appears patient is on enteric precautions, had C. difficile ordered 09/04. Also patient has rectal erythema, nurses been using barrier cream.    Objective   Vital signs in last 24 hours: Temp:  [97.6 F (36.4 C)-98.2 F (36.8 C)] 97.6 F (36.4 C) (09/07 0604) Pulse Rate:  [59-71] 60 (09/07 0604) Resp:  [12-21] 17 (09/06 1933) BP: (137-177)/(65-91) 154/75 (09/07 0604) SpO2:  [93 %-100 %] 99 % (09/07 0604) Last BM Date : 07/31/22 Last BM recorded by nurses in past 5 days Stool Type: Type 7 (Liquid consistency with no solid pieces) (07/31/2022 10:15 AM)  General:   Elderly female, no acute distress. Heart: Regular rate and rhythm Pulm: Clear anteriorly Abdomen:  Soft, Obese AB,  present  bowel sounds. No tenderness . , Extremities:  without  edema. Neurologic:  Alert, unable to answer questions well.   No focal deficits.  Psych:  Cooperative. Normal mood and affect.  Intake/Output from previous day: 09/06 0701 - 09/07 0700 In: 1901.2 [P.O.:240; I.V.:1500; IV Piggyback:161.2] Out: 500 [Urine:500] Intake/Output this shift: No intake/output data recorded.  Studies/Results: DG ERCP  Result Date: 07/30/2022 CLINICAL DATA:  ERCP for choledocholithiasis. EXAM: ERCP TECHNIQUE: Multiple spot images obtained with the fluoroscopic device and submitted for interpretation post-procedure. FLUOROSCOPY TIME: FLUOROSCOPY TIME 3 minutes, 6 seconds (26.7 mGy) COMPARISON:  CT abdomen and pelvis-07/28/2022 FINDINGS: 16 spot intraoperative fluoroscopic images the right upper abdominal quadrant during ERCP are  provided for review Initial image demonstrates ERCP probe overlying the right upper abdominal quadrant. Multiple surgical clips overlie the right mid hemiabdomen Subsequent images demonstrate selective cannulation and opacification of the common bile duct which appears moderate to markedly dilated. There are 2 discrete filling defects within distal aspect of the CBD compatible with provided history of choledocholithiasis. Subsequent images demonstrate insufflation of a balloon within distal aspect of the CBD with subsequent biliary sweeping and presumed stone extraction and sphincterotomy. IMPRESSION: ERCP with findings of choledocholithiasis with subsequent biliary sweeping and presumed stone extraction and sphincterotomy as above. These images were submitted for radiologic interpretation only. Please see the procedural report for the amount of contrast and the fluoroscopy time utilized. Electronically Signed   By: Sandi Mariscal M.D.   On: 07/30/2022 16:08    Lab Results: Recent Labs    07/29/22 0444 07/30/22 0758 07/31/22 0025  WBC 19.9* 12.5* 11.8*  HGB 10.7* 10.9* 10.8*  HCT 32.6* 33.7* 32.2*  PLT 158 186 187   BMET Recent Labs    07/29/22 0444 07/30/22 0758 07/31/22 0025  NA 144 142 137  K 3.2* 3.7 3.7  CL 113* 112* 105  CO2 '25 23 22  '$ GLUCOSE 118* 96 172*  BUN 30* 19 15  CREATININE 1.51* 1.14* 0.96  CALCIUM 8.1* 8.6* 8.4*   LFT Recent Labs    07/31/22 0025  PROT 5.3*  ALBUMIN 2.2*  AST 20  ALT 27  ALKPHOS 100  BILITOT 1.1   PT/INR No results for input(s): "LABPROT", "INR" in the last 72 hours.    Patient profile:   87 year old female with dementia, history of colon cancer status post right hemicolectomy 1994  last surveillance colonoscopies 03/2012, presented with choledocholithiasis possible cholangitis with significant leukocytosis.  On Zosyn Status post ERCP with sphincterectomy and stone removal with Dr. Fuller Plan 07/30/2022.   Impression/Plan:   Cholangitis Status  post ERCP on 07/30/2022 with sphincterectomy and stone removal possible pus within the sludge Continue antibiotics 7 days total Patient tolerating diet well, advance as tolerated. No abdominal pain, no signs of pancreatitis. White blood cell count improving, liver function unremarkable. Can follow-up outpatient with primary GI  Normocytic anemia Stable  Loose stools Currently patient is only had 2 episodes of loose stools yesterday, 1 this morning. We will cancel C. difficile order and enteric precautions at this time. We will send in [place] cream If patient continues to have worsening loose stools this admission, more than 3 a day, can consider getting C. difficile that was ordered.   LOS: 3 days   Vladimir Crofts  07/31/2022, 11:45 AM   Attending physician's note   I have taken a history, reviewed the chart and examined the patient. I performed a substantive portion of this encounter, including complete performance of at least one of the key components, in conjunction with the APP. I agree with the APP's note, impression and recommendations.    S/p ERCP with sphincterotomy and stone extraction yesterday Overall doing better Tolerating liquid diet well, slowly advance diet as tolerated Complete 7-day course of antibiotics  Follow-up with surgery for possible cholecystectomy to prevent recurrent episode  GI will sign off, available if have any questions  The patient and family was provided an opportunity to ask questions and all were answered. They agreed with the plan and demonstrated an understanding of the instructions.   Damaris Hippo , MD [phone]

## 2022-07-31 NOTE — Care Management Important Message (Signed)
Important Message  Patient Details  Name: Glenda Nolan MRN: [id number] Date of Birth: 1935-08-07   Medicare Important Message Given:  Yes     [name] 07/31/2022, 11:57 AM

## 2022-08-01 DIAGNOSIS — K8050 Calculus of bile duct without cholangitis or cholecystitis without obstruction: Secondary | ICD-10-CM | POA: Diagnosis not present

## 2022-08-01 LAB — COMPREHENSIVE METABOLIC PANEL
ALT: 37 U/L (ref 0–44)
AST: 39 U/L (ref 15–41)
Albumin: 2.3 g/dL — ABNORMAL LOW (ref 3.5–5.0)
Alkaline Phosphatase: 86 U/L (ref 38–126)
Anion gap: 10 (ref 5–15)
BUN: 9 mg/dL (ref 8–23)
CO2: 24 mmol/L (ref 22–32)
Calcium: 8.5 mg/dL — ABNORMAL LOW (ref 8.9–10.3)
Chloride: 106 mmol/L (ref 98–111)
Creatinine, Ser: 0.95 mg/dL (ref 0.44–1.00)
GFR, Estimated: 59 mL/min — ABNORMAL LOW (ref >60)
Glucose, Bld: 102 mg/dL — ABNORMAL HIGH (ref 70–99)
Potassium: 3 mmol/L — ABNORMAL LOW (ref 3.5–5.1)
Sodium: 140 mmol/L (ref 135–145)
Total Bilirubin: 0.9 mg/dL (ref 0.3–1.2)
Total Protein: 5.6 g/dL — ABNORMAL LOW (ref 6.5–8.1)

## 2022-08-01 LAB — GLUCOSE, CAPILLARY
Glucose-Capillary: 103 mg/dL — ABNORMAL HIGH (ref 70–99)
Glucose-Capillary: 112 mg/dL — ABNORMAL HIGH (ref 70–99)
Glucose-Capillary: 117 mg/dL — ABNORMAL HIGH (ref 70–99)
Glucose-Capillary: 138 mg/dL — ABNORMAL HIGH (ref 70–99)
Glucose-Capillary: 86 mg/dL (ref 70–99)
Glucose-Capillary: 98 mg/dL (ref 70–99)

## 2022-08-01 LAB — CBC WITH DIFFERENTIAL/PLATELET
Abs Immature Granulocytes: 0.4 10*3/uL — ABNORMAL HIGH (ref 0.00–0.07)
Basophils Absolute: 0.1 10*3/uL (ref 0.0–0.1)
Basophils Relative: 1 %
Eosinophils Absolute: 0.5 10*3/uL (ref 0.0–0.5)
Eosinophils Relative: 5 %
HCT: 32.7 % — ABNORMAL LOW (ref 36.0–46.0)
Hemoglobin: 10.8 g/dL — ABNORMAL LOW (ref 12.0–15.0)
Immature Granulocytes: 4 %
Lymphocytes Relative: 23 %
Lymphs Abs: 2.2 10*3/uL (ref 0.7–4.0)
MCH: 30 pg (ref 26.0–34.0)
MCHC: 33 g/dL (ref 30.0–36.0)
MCV: 90.8 fL (ref 80.0–100.0)
Monocytes Absolute: 0.7 10*3/uL (ref 0.1–1.0)
Monocytes Relative: 7 %
Neutro Abs: 6 10*3/uL (ref 1.7–7.7)
Neutrophils Relative %: 60 %
Platelets: 200 10*3/uL (ref 150–400)
RBC: 3.6 MIL/uL — ABNORMAL LOW (ref 3.87–5.11)
RDW: 12.9 % (ref 11.5–15.5)
WBC: 9.7 10*3/uL (ref 4.0–10.5)
nRBC: 0 % (ref 0.0–0.2)

## 2022-08-01 LAB — MAGNESIUM: Magnesium: 1.5 mg/dL — ABNORMAL LOW (ref 1.7–2.4)

## 2022-08-01 LAB — PHOSPHORUS: Phosphorus: 3.6 mg/dL (ref 2.5–4.6)

## 2022-08-01 MED ORDER — MAGNESIUM SULFATE 2 GM/50ML IV SOLN
2.0000 g | Freq: Once | INTRAVENOUS | Status: AC
  Administered 2022-08-01: 2 g via INTRAVENOUS
  Filled 2022-08-01: qty 50

## 2022-08-01 MED ORDER — HALOPERIDOL LACTATE 5 MG/ML IJ SOLN: 2.5000 mg | Freq: Once | INTRAMUSCULAR | Status: DC

## 2022-08-01 MED ORDER — POTASSIUM CHLORIDE CRYS ER 20 MEQ PO TBCR
40.0000 meq | EXTENDED_RELEASE_TABLET | ORAL | Status: AC
  Administered 2022-08-01 (×2): 40 meq via ORAL
  Filled 2022-08-01 (×2): qty 2

## 2022-08-01 NOTE — Consult Note (Signed)
[facility] Nurse Consult Note: Reason for Consult:irritant contact dermatitis due to urinary incontinence, fecal incontinence and antibiotic use Wound type: irritant contact dermatitis  ICD-10 CM Codes for Irritant Dermatitis  L24A2 - Due to fecal, urinary or dual incontinence L24A9 - Due to friction or contact with other specified body fluids L30.4  - Erythema intertrigo. Also used for abrasion of the hand, chafing of the skin, dermatitis due to sweating and friction, friction dermatitis, friction eczema, and genital/thigh intertrigo.   Pressure Injury POA: N/A Measurement: diffuse area of erythema in the perineum, medial thighs, gluteal cleft Wound bed: Scattered pinpoint areas of tissue loss Drainage (amount, consistency, odor) scant serous Periwound:as noted above Dressing procedure/placement/frequency: I will provide patient with a mattress replacement with low air loss feature to mitigate moisture and reduce risk of pressure injury. Bilateral Prevalon pressure redistribution heel boots are provided and turning and repositioning is in place while in bed. While OOB in the chair, a pressure redistribution chair cushion is provided for PI prevention. A sacral silicone bordered foam dressing is to be placed for PI prevention.  Topical care is with Gerhart's Butt Cream, a prescriptive compounded preparation consisting of 1:1:1 hydrocortisone cream, lotrimin cream and zinc oxide. This was ordered yesterday, but I have increased the frequency of application and added a PRN usage for post incontinence care.  I recommend a few days of oral antifungal therapy (e.g. Diflucan) to jump start the resolution of this skin issue and have communicated with Dr. Florene Glen about that. He is in agreement and will order.  [facility] nursing team will not follow, but will remain available to this patient, the nursing and medical teams.  Please re-consult if needed.  Thank you for inviting [place] to participate in this patient's  Plan of Care.  Maudie Flakes, MSN, RN, CNS, [place], Serita Grammes, [employer], [employer] phone:  [phone]

## 2022-08-01 NOTE — Progress Notes (Signed)
PROGRESS NOTE    Glenda Nolan  VHQ:[id number] DOB: Oct 18, 1935 DOA: 07/28/2022 PCP: Celene Squibb, MD  Chief Complaint  Patient presents with   Diarrhea    Brief Narrative:  87 year old female with history of dementia, hypertension, diabetes, brought to the hospital with worsening weakness and diarrhea.  Imaging of her abdomen indicated choledocholithiasis with biliary ductal dilatation.  LFTs were noted to be elevated.  She had AKI with elevated creatinine.  Started on IV fluids and intravenous antibiotics.  Case was reviewed with GI and [facility] who agreed with transfer to [facility] for further evaluation     Assessment & Plan:   Active Problems:   Choledocholithiasis   Diarrhea   AKI (acute kidney injury) ([place])   Essential hypertension   Dementia (HCC)   Diabetes ([place])   Hypokalemia   Assessment and Plan: Choledocholithiasis CT abd/pelvis with choledocholithiasis resulting in moderate extrahepatic biliary dilatation, minimal cholelithiasis RUQ [place] with cholelithiasis without cholecystitis Intra and extrahepatic biliary ductal dilatation concerning for choledocholithiasis  LFT's overall improved WBC 28 on presentation, improving Continue antibiotics Appreciate GI, s/p ERCP with biliary sphicterotomy and balloon extraction, distal esophageal stricture, periampullatory diverticulum.  Recommending avoiding aspirin, NSAIDs x2 weeks, abx x7 days for cholangitis.   General surgery c/s, risks thought to outweigh benefit Lives home alone, walks without cane/walker in home (though she has them and is supposed to use them).  Dresses herself, needs help with bathing.  No hx MI or CVA or HF.    Diarrhea Will hold on c diff for now, monitor  AKI (acute kidney injury) ([place]) Peaked at 2.44 Improving Hold lasix and telmisartan   Hypokalemia  Hypomagnesemia Replace and follow  Essential hypertension Stable. -Hold telmisartan and Lasix for now with AKI -Resume  metoprolol  Dementia (HCC) Donepezil Delirium precautions  Diabetes (HCC) Hgba1c 6.8 Hold metformin SSi- S q6h   MASD Likely related to diarrhea Will continue to monitor  Wound c/s     DVT prophylaxis: heparin Code Status: full Family Communication: daughter outside room Disposition:   Status is: Inpatient Remains inpatient appropriate because: need for continued procedures   Consultants:  GI  Procedures:  ERCP  Antimicrobials:  Anti-infectives (From admission, onward)    Start     Dose/Rate Route Frequency Ordered Stop   07/29/22 1100  piperacillin-tazobactam (ZOSYN) IVPB 3.375 g        3.375 g 12.5 mL/hr over 240 Minutes Intravenous Every 8 hours 07/29/22 1039     07/29/22 1030  piperacillin-tazobactam (ZOSYN) 2.25 g in sodium chloride 0.9 % 50 mL IVPB  Status:  Discontinued        2.25 g 100 mL/hr over 30 Minutes Intravenous Every 8 hours 07/29/22 1009 07/29/22 1039   07/28/22 1800  piperacillin-tazobactam (ZOSYN) IVPB 2.25 g  Status:  Discontinued        2.25 g 100 mL/hr over 30 Minutes Intravenous Every 8 hours 07/28/22 1750 07/29/22 1009       Subjective: No complaints Thinks she's still not feeling 100%  Objective: Vitals:   07/31/22 0604 07/31/22 1318 07/31/22 2027 08/01/22 0900  BP: (!) 154/75 129/64 (!) 152/68 (!) 155/68  Pulse: 60 (!) 56 66 64  Resp:  '16 18 18  '$ Temp: 97.6 F (36.4 C) 98.2 F (36.8 C) (!) 97.3 F (36.3 C) 97.9 F (36.6 C)  TempSrc: Oral Oral Oral Oral  SpO2: 99% 100% 100% 97%  Weight:      Height:  Intake/Output Summary (Last 24 hours) at 08/01/2022 1006 Last data filed at 08/01/2022 0900 Gross per 24 hour  Intake 840 ml  Output --  Net 840 ml   Filed Weights   07/28/22 1143  Weight: 65.8 kg    Examination:  General: No acute distress. Cardiovascular: RRR Lungs: unlabored Abdomen: Soft, nontender, nondistended  Neurological: Alert. Moves all extremities 4 . Cranial nerves II through XII grossly  intact. Extremities: No clubbing or cyanosis. No edema. Data Reviewed: I have personally reviewed following labs and imaging studies  CBC: Recent Labs  Lab 07/28/22 1556 07/29/22 0444 07/30/22 0758 07/31/22 0025 08/01/22 0847  WBC 28.0* 19.9* 12.5* 11.8* 9.7  NEUTROABS 24.2*  --   --  10.1* 6.0  HGB 11.5* 10.7* 10.9* 10.8* 10.8*  HCT 34.5* 32.6* 33.7* 32.2* 32.7*  MCV 91.8 92.4 92.3 90.7 90.8  PLT 174 158 186 187 161    Basic Metabolic Panel: Recent Labs  Lab 07/28/22 1556 07/29/22 0444 07/30/22 0758 07/31/22 0025 08/01/22 0847  NA 144 144 142 137 140  K 3.4* 3.2* 3.7 3.7 3.0*  CL 106 113* 112* 105 106  CO2 '26 25 23 22 24  '$ GLUCOSE 117* 118* 96 172* 102*  BUN 32* 30* '19 15 9  '$ CREATININE 2.44* 1.51* 1.14* 0.96 0.95  CALCIUM 7.9* 8.1* 8.6* 8.4* 8.5*  MG 2.3  --   --  1.7 1.5*  PHOS  --   --   --  4.1 3.6    GFR: Estimated Creatinine Clearance: 40.4 mL/min (by C-G formula based on SCr of 0.95 mg/dL).  Liver Function Tests: Recent Labs  Lab 07/28/22 1556 07/29/22 0444 07/30/22 0758 07/31/22 0025 08/01/22 0847  AST 52* 32 23 20 39  ALT 60* 44 32 27 37  ALKPHOS 142* 121 117 100 86  BILITOT 3.1* 1.7* 1.3* 1.1 0.9  PROT 6.9 5.9* 5.6* 5.3* 5.6*  ALBUMIN 3.1* 2.6* 2.3* 2.2* 2.3*    CBG: Recent Labs  Lab 07/31/22 0607 07/31/22 0728 07/31/22 1823 08/01/22 0048 08/01/22 0443  GLUCAP 108* 89 134* 112* 98     Recent Results (from the past 240 hour(s))  Culture, blood (Routine X 2) w Reflex to ID Panel     Status: None (Preliminary result)   Collection Time: 07/28/22  9:22 PM   Specimen: BLOOD  Result Value Ref Range Status   Specimen Description BLOOD LEFT ANTECUBITAL  Final   Special Requests   Final    BOTTLES DRAWN AEROBIC AND ANAEROBIC Blood Culture adequate volume   Culture   Final    NO GROWTH 3 DAYS Performed at [facility], [address], [place], [place] [address]    Report Status PENDING  Incomplete  Culture, blood (Routine X 2) w Reflex  to ID Panel     Status: None (Preliminary result)   Collection Time: 07/28/22  9:22 PM   Specimen: BLOOD  Result Value Ref Range Status   Specimen Description BLOOD BLOOD LEFT WRIST  Final   Special Requests   Final    BOTTLES DRAWN AEROBIC AND ANAEROBIC Blood Culture adequate volume   Culture   Final    NO GROWTH 3 DAYS Performed at [facility], [address], [place], [place] [address]    Report Status PENDING  Incomplete         Radiology Studies: DG ERCP  Result Date: 07/30/2022 CLINICAL DATA:  ERCP for choledocholithiasis. EXAM: ERCP TECHNIQUE: Multiple spot images obtained with the fluoroscopic device and submitted for  interpretation post-procedure. FLUOROSCOPY TIME: FLUOROSCOPY TIME 3 minutes, 6 seconds (26.7 mGy) COMPARISON:  CT abdomen and pelvis-07/28/2022 FINDINGS: 16 spot intraoperative fluoroscopic images the right upper abdominal quadrant during ERCP are provided for review Initial image demonstrates ERCP probe overlying the right upper abdominal quadrant. Multiple surgical clips overlie the right mid hemiabdomen Subsequent images demonstrate selective cannulation and opacification of the common bile duct which appears moderate to markedly dilated. There are 2 discrete filling defects within distal aspect of the CBD compatible with provided history of choledocholithiasis. Subsequent images demonstrate insufflation of Deema Juncaj balloon within distal aspect of the CBD with subsequent biliary sweeping and presumed stone extraction and sphincterotomy. IMPRESSION: ERCP with findings of choledocholithiasis with subsequent biliary sweeping and presumed stone extraction and sphincterotomy as above. These images were submitted for radiologic interpretation only. Please see the procedural report for the amount of contrast and the fluoroscopy time utilized. Electronically Signed   By: Sandi Mariscal M.D.   On: 07/30/2022 16:08        Scheduled Meds:  donepezil  10 mg Oral QHS   heparin  5,000  Units Subcutaneous Q8H   insulin aspart  0-9 Units Subcutaneous Q6H   levothyroxine  75 mcg Oral Q0600   metoprolol tartrate  100 mg Oral BID   pantoprazole  40 mg Oral Daily   raloxifene  60 mg Oral QPC breakfast   Continuous Infusions:  piperacillin-tazobactam (ZOSYN)  IV 3.375 g (08/01/22 0438)     LOS: 4 days    Time spent: over 30 min    Fayrene Helper, MD [facility]   To contact the attending provider between 7A-7P or the covering provider during after hours 7P-7A, please log into the web site [email or web address] and access using universal [place] password for that web site. If you do not have the password, please call the hospital operator.  08/01/2022, 10:06 AM

## 2022-08-01 NOTE — Progress Notes (Signed)
Mobility Specialist - Progress Note   08/01/22 1513  Mobility  Activity Ambulated with assistance in room  Level of Assistance Moderate assist, patient does 50-74%  Assistive Device Front wheel walker  Distance Ambulated (ft) 6 ft  Activity Response Tolerated well  $Mobility charge 1 Mobility    Pt received in recliner agreeable to mobility. ModA required to stand up from recliner. Left in recliner w/ call bell in reach and all needs met.   Paulla Dolly Mobility Specialist

## 2022-08-01 NOTE — Progress Notes (Signed)
HOSPITAL MEDICINE OVERNIGHT EVENT NOTE    Notified by nursing the patient has become increasingly agitated throughout the evening.  Nursing states that patient has not been following commands, has been frequently attempting to get out of bed and has continued to attempt to pull out medical devices despite the application of mittens.  Chart reviewed, patient has a known history of dementia.  Patient is currently hospitalized for choledocholithiasis, currently being followed by gastroenterology status post ERCP with biliary sphincterotomy and balloon extraction.  Patient is currently on intravenous antibiotics due to concerns for cholangitis.  Nursing staff were able to obtain a sitter who is currently sitting at the patient's bedside.  Additionally providing patient with 2.5 mg of intravenous Haldol.  Continue to monitor patient closely.  Glenda Emerald  MD [facility]

## 2022-08-02 DIAGNOSIS — K8050 Calculus of bile duct without cholangitis or cholecystitis without obstruction: Secondary | ICD-10-CM | POA: Diagnosis not present

## 2022-08-02 LAB — COMPREHENSIVE METABOLIC PANEL
ALT: 32 U/L (ref 0–44)
AST: 25 U/L (ref 15–41)
Albumin: 2.3 g/dL — ABNORMAL LOW (ref 3.5–5.0)
Alkaline Phosphatase: 87 U/L (ref 38–126)
Anion gap: 9 (ref 5–15)
BUN: 9 mg/dL (ref 8–23)
CO2: 24 mmol/L (ref 22–32)
Calcium: 8.4 mg/dL — ABNORMAL LOW (ref 8.9–10.3)
Chloride: 106 mmol/L (ref 98–111)
Creatinine, Ser: 1.03 mg/dL — ABNORMAL HIGH (ref 0.44–1.00)
GFR, Estimated: 53 mL/min — ABNORMAL LOW (ref >60)
Glucose, Bld: 113 mg/dL — ABNORMAL HIGH (ref 70–99)
Potassium: 4.1 mmol/L (ref 3.5–5.1)
Sodium: 139 mmol/L (ref 135–145)
Total Bilirubin: 0.7 mg/dL (ref 0.3–1.2)
Total Protein: 5.4 g/dL — ABNORMAL LOW (ref 6.5–8.1)

## 2022-08-02 LAB — GLUCOSE, CAPILLARY
Glucose-Capillary: 125 mg/dL — ABNORMAL HIGH (ref 70–99)
Glucose-Capillary: 126 mg/dL — ABNORMAL HIGH (ref 70–99)
Glucose-Capillary: 138 mg/dL — ABNORMAL HIGH (ref 70–99)
Glucose-Capillary: 83 mg/dL (ref 70–99)

## 2022-08-02 LAB — PHOSPHORUS: Phosphorus: 2.6 mg/dL (ref 2.5–4.6)

## 2022-08-02 LAB — CBC WITH DIFFERENTIAL/PLATELET
Abs Immature Granulocytes: 0.62 10*3/uL — ABNORMAL HIGH (ref 0.00–0.07)
Basophils Absolute: 0.1 10*3/uL (ref 0.0–0.1)
Basophils Relative: 1 %
Eosinophils Absolute: 0.5 10*3/uL (ref 0.0–0.5)
Eosinophils Relative: 5 %
HCT: 32.8 % — ABNORMAL LOW (ref 36.0–46.0)
Hemoglobin: 10.7 g/dL — ABNORMAL LOW (ref 12.0–15.0)
Immature Granulocytes: 5 %
Lymphocytes Relative: 21 %
Lymphs Abs: 2.4 10*3/uL (ref 0.7–4.0)
MCH: 30 pg (ref 26.0–34.0)
MCHC: 32.6 g/dL (ref 30.0–36.0)
MCV: 91.9 fL (ref 80.0–100.0)
Monocytes Absolute: 1 10*3/uL (ref 0.1–1.0)
Monocytes Relative: 8 %
Neutro Abs: 6.9 10*3/uL (ref 1.7–7.7)
Neutrophils Relative %: 60 %
Platelets: 219 10*3/uL (ref 150–400)
RBC: 3.57 MIL/uL — ABNORMAL LOW (ref 3.87–5.11)
RDW: 12.9 % (ref 11.5–15.5)
WBC: 11.5 10*3/uL — ABNORMAL HIGH (ref 4.0–10.5)
nRBC: 0 % (ref 0.0–0.2)

## 2022-08-02 LAB — CULTURE, BLOOD (ROUTINE X 2)
Culture: NO GROWTH
Culture: NO GROWTH
Special Requests: ADEQUATE
Special Requests: ADEQUATE

## 2022-08-02 LAB — MAGNESIUM: Magnesium: 1.8 mg/dL (ref 1.7–2.4)

## 2022-08-02 MED ORDER — MAGNESIUM SULFATE 2 GM/50ML IV SOLN: 2.0000 g | Freq: Once | INTRAVENOUS | Status: DC

## 2022-08-02 MED ORDER — FLUCONAZOLE 100 MG PO TABS
100.0000 mg | ORAL_TABLET | Freq: Every day | ORAL | Status: AC
  Administered 2022-08-02 – 2022-08-06 (×5): 100 mg via ORAL
  Filled 2022-08-02 (×5): qty 1

## 2022-08-02 NOTE — NC FL2 (Cosign Needed Addendum)
[place] LEVEL OF CARE SCREENING TOOL     IDENTIFICATION  Patient Name: Glenda Nolan Birthdate: 1935/05/07 Sex: female Admission Date (Current Location): 07/28/2022  [facility] and [place] Number:  Herbalist and Address:  [facility], [place] [address] [place], [place] [address]      Provider Number: [id number]  Attending Physician Name and Address:  Elodia Florence., *  Relative Name and Phone Number:  [name] (Daughter)   [phone]    Current Level of Care: Hospital Recommended Level of Care: [place] Prior Approval Number:    Date Approved/Denied:   PASRR Number:  [id number]  Discharge Plan: SNF    Current Diagnoses: Patient Active Problem List   Diagnosis Date Noted   Diarrhea 07/30/2022   Choledocholithiasis 07/28/2022   Dementia ([place]) 07/28/2022   AKI (acute kidney injury) ([place]) 07/28/2022   Diabetes ([place]) 07/28/2022   Hypokalemia 07/28/2022   Spinal stenosis of lumbar region with neurogenic claudication 05/24/2019   Spondylosis without myelopathy or radiculopathy, lumbar region 05/24/2019   S/P TKR (total knee replacement), left 03/18/2017 03/18/2017   Heme positive stool 03/22/2012   Melena 03/22/2012   GERD (gastroesophageal reflux disease) 03/22/2012   CARCINOMA, COLON 04/26/2009   MIGRAINE HEADACHE 04/26/2009   Essential hypertension 04/26/2009   DEGENERATIVE JOINT DISEASE 04/26/2009   IRON DEFICIENCY ANEMIA, HX OF 04/26/2009   KNEE, ARTHRITIS, DEGEN./OSTEO 03/01/2009   KNEE PAIN 03/01/2009    Orientation RESPIRATION BLADDER Height & Weight     Self  Normal Incontinent, External catheter Weight: 145 lb (65.8 kg) Height:  '5\' 4"'$  (162.6 cm)  BEHAVIORAL SYMPTOMS/MOOD NEUROLOGICAL BOWEL NUTRITION STATUS      Incontinent Diet (See DC Summary)  AMBULATORY STATUS COMMUNICATION OF NEEDS Skin   Minimal Assist   Surgical wounds                       Personal Care  Assistance Level of Assistance  Bathing, Feeding, Dressing Bathing Assistance: Maximum assistance Feeding assistance: Independent Dressing Assistance: Maximum assistance     Functional Limitations Info  Sight, Hearing, Speech Sight Info: Adequate Hearing Info: Adequate Speech Info: Adequate    SPECIAL CARE FACTORS FREQUENCY  PT (By licensed PT), OT (By licensed OT)     PT Frequency: 5x a week OT Frequency: 5x a week            Contractures Contractures Info: Not present    Additional Factors Info  Code Status, Allergies Code Status Info: Full Allergies Info: NKA           Current Medications (08/02/2022):  This is the current hospital active medication list Current Facility-Administered Medications  Medication Dose Route Frequency Provider Last Rate Last Admin   acetaminophen (TYLENOL) tablet 650 mg  650 mg Oral Q6H PRN Emokpae, Ejiroghene E, MD   650 mg at 07/31/22 2021   Or   acetaminophen (TYLENOL) suppository 650 mg  650 mg Rectal Q6H PRN Emokpae, Ejiroghene E, MD       donepezil (ARICEPT) tablet 10 mg  10 mg Oral QHS Emokpae, Ejiroghene E, MD   10 mg at 08/01/22 2150   Gerhardt's butt cream   Topical TID PRN Elodia [place]., MD   Given at 08/01/22 1645   haloperidol lactate (HALDOL) injection 2.5 mg  2.5 mg Intravenous Once Shalhoub, Sherryll Burger, MD       heparin injection 5,000 Units  5,000 Units Subcutaneous Q8H  Vena Rua, PA-C   5,000 Units at 08/02/22 7858   insulin aspart (novoLOG) injection 0-9 Units  0-9 Units Subcutaneous Q6H Emokpae, Ejiroghene E, MD   1 Units at 08/01/22 1900   levothyroxine (SYNTHROID) tablet 75 mcg  75 mcg Oral Q0600 Emokpae, Ejiroghene E, MD   75 mcg at 08/02/22 0609   metoprolol tartrate (LOPRESSOR) tablet 100 mg  100 mg Oral BID Emokpae, Ejiroghene E, MD   100 mg at 08/02/22 0954   pantoprazole (PROTONIX) EC tablet 40 mg  40 mg Oral Daily Emokpae, Ejiroghene E, MD   40 mg at 08/02/22 0954   piperacillin-tazobactam (ZOSYN)  IVPB 3.375 g  3.375 g Intravenous Q8H Memon, Jolaine Artist, MD 12.5 mL/hr at 08/02/22 0408 3.375 g at 08/02/22 0408   raloxifene (EVISTA) tablet 60 mg  60 mg Oral QPC breakfast Emokpae, Ejiroghene E, MD   60 mg at 08/02/22 8502     Discharge Medications: Please see discharge summary for a list of discharge medications.  Relevant Imaging Results:  Relevant Lab Results:   Additional Information SSN: [id number]  Reece Agar, [place]

## 2022-08-02 NOTE — Social Work (Signed)
RE: Glenda Nolan Date of Birth: 11-11-1935 Date: 08/02/2022  Please be advised that the above-named patient will require a short-term nursing home stay - anticipated 30 days or less for rehabilitation and strengthening.  The plan is for return home.

## 2022-08-02 NOTE — Progress Notes (Signed)
Pharmacy Antibiotic Note  Glenda Nolan is a 87 y.o. female admitted on 07/28/2022 with diarrhea. CT A/P with choledochololithiasis resulting in moderate extrahepatic biliary dilatation with minimal cholelithiasis. Pharmacy has been consulted for fluconazole dosing for intertrigo.   Plan: START fluconazole 100 mg PO daily  Continue Zosyn with plans for 7d of tx per hospitalist note  F/U DOT (consider 7d or switch to topicals)  Height: '5\' 4"'$  (162.6 cm) Weight: 65.8 kg (145 lb) IBW/kg (Calculated) : 54.7  Temp (24hrs), Avg:98.3 F (36.8 C), Min:98.2 F (36.8 C), Max:98.4 F (36.9 C)  Recent Labs  Lab 07/28/22 2122 07/29/22 0444 07/30/22 0758 07/31/22 0025 08/01/22 0847 08/02/22 0041  WBC  --  19.9* 12.5* 11.8* 9.7 11.5*  CREATININE  --  1.51* 1.14* 0.96 0.95 1.03*  LATICACIDVEN 1.3  --   --   --   --   --     Estimated Creatinine Clearance: 37.3 mL/min (A) (by C-G formula based on SCr of 1.03 mg/dL (H)).    No Known Allergies  Antimicrobials this admission: Zosyn 9/4>> c fluconazole 9/9 >> c  Microbiology results: 9/4 BCx: NGTD  Thank you for allowing pharmacy to be a part of this patient's care.  Adria Dill, PharmD PGY-2 Infectious Diseases Resident  08/02/2022 11:40 AM

## 2022-08-02 NOTE — Progress Notes (Addendum)
PROGRESS NOTE    Glenda Nolan  SVX:[id number] DOB: 02/01/1935 DOA: 07/28/2022 PCP: Glenda Squibb, MD  Chief Complaint  Patient presents with   Diarrhea    Brief Narrative:  87 year old female with history of dementia, hypertension, diabetes, brought to the hospital with worsening weakness and diarrhea.  Imaging of her abdomen indicated choledocholithiasis with biliary ductal dilatation.  LFTs were noted to be elevated.  She had AKI with elevated creatinine.  Started on IV fluids and intravenous antibiotics.  Case was reviewed with GI and Glenda Nolan who agreed with transfer to Glenda Nolan for further evaluation     Assessment & Plan:   Active Problems:   Choledocholithiasis   Diarrhea   AKI (acute kidney injury) ([place])   Essential hypertension   Dementia (HCC)   Diabetes ([place])   Hypokalemia   Assessment and Plan: Choledocholithiasis CT abd/pelvis with choledocholithiasis resulting in moderate extrahepatic biliary dilatation, minimal cholelithiasis RUQ [place] with cholelithiasis without cholecystitis Intra and extrahepatic biliary ductal dilatation concerning for choledocholithiasis  LFT's overall improved WBC 28 on presentation, improving Continue antibiotics Appreciate GI, s/p ERCP with biliary sphicterotomy and balloon extraction, distal esophageal stricture, periampullatory diverticulum.  Recommending avoiding aspirin, NSAIDs x2 weeks, abx x7 days for cholangitis.   General surgery c/s, risks thought to outweigh benefit Lives home alone, walks without cane/walker in home (though she has them and is supposed to use them).  Dresses herself, needs help with bathing.  No hx MI or CVA or HF.    Diarrhea Will hold on c diff for now, monitor  AKI (acute kidney injury) ([place]) Peaked at 2.44 Improving Hold lasix and telmisartan   Hypokalemia  Hypomagnesemia Replace and follow  Essential hypertension Stable. -Hold telmisartan and Lasix for now with AKI -Resume  metoprolol  Dementia (HCC) Donepezil Delirium precautions Complicated by delirium overnight - encouraged family to stay if possible   Diabetes ([place]) Hgba1c 6.8 Hold metformin SSi- S q6h   MASD  Intertrigo Likely related to diarrhea Will continue to monitor  Wound c/s  Will give fluconazole    DVT prophylaxis: heparin Code Status: full Family Communication: daughter, granddaughter Disposition:   Status is: Inpatient Remains inpatient appropriate because: need for SNF placement   Consultants:  GI  Procedures:  ERCP  Antimicrobials:  Anti-infectives (From admission, onward)    Start     Dose/Rate Route Frequency Ordered Stop   08/02/22 1230  fluconazole (DIFLUCAN) tablet 100 mg        100 mg Oral Daily 08/02/22 1135     07/29/22 1100  piperacillin-tazobactam (ZOSYN) IVPB 3.375 g        3.375 g 12.5 mL/hr over 240 Minutes Intravenous Every 8 hours 07/29/22 1039     07/29/22 1030  piperacillin-tazobactam (ZOSYN) 2.25 g in sodium chloride 0.9 % 50 mL IVPB  Status:  Discontinued        2.25 g 100 mL/hr over 30 Minutes Intravenous Every 8 hours 07/29/22 1009 07/29/22 1039   07/28/22 1800  piperacillin-tazobactam (ZOSYN) IVPB 2.25 g  Status:  Discontinued        2.25 g 100 mL/hr over 30 Minutes Intravenous Every 8 hours 07/28/22 1750 07/29/22 1009       Subjective: No new complaints, pleasantly confused  Objective: Vitals:   08/01/22 1543 08/01/22 1946 08/02/22 0407 08/02/22 0725  BP: 127/61 123/66 137/67 (!) 147/76  Pulse: 66 73 60 62  Resp: '16 17 17 17  '$ Temp: 98.4 F (36.9 C) 98.2 F (36.8 C)  98.3 F (36.8 C) 98.3 F (36.8 C)  TempSrc: Oral Oral Oral Oral  SpO2: 99% 99% 97% 98%  Weight:      Height:        Intake/Output Summary (Last 24 hours) at 08/02/2022 1522 Last data filed at 08/02/2022 1230 Gross per 24 hour  Intake 947.22 ml  Output --  Net 947.22 ml   Filed Weights   07/28/22 1143  Weight: 65.8 kg    Examination:  General: No acute  distress. Cardiovascular:RRR Lungs: unlabored Abdomen: Soft, nontender, nondistended  Neurological: pleasantly confused, moving all extremities Extremities: No clubbing or cyanosis. No edema.  Data Reviewed: I have personally reviewed following labs and imaging studies  CBC: Recent Labs  Lab 07/28/22 1556 07/29/22 0444 07/30/22 0758 07/31/22 0025 08/01/22 0847 08/02/22 0041  WBC 28.0* 19.9* 12.5* 11.8* 9.7 11.5*  NEUTROABS 24.2*  --   --  10.1* 6.0 6.9  HGB 11.5* 10.7* 10.9* 10.8* 10.8* 10.7*  HCT 34.5* 32.6* 33.7* 32.2* 32.7* 32.8*  MCV 91.8 92.4 92.3 90.7 90.8 91.9  PLT 174 158 186 187 200 287    Basic Metabolic Panel: Recent Labs  Lab 07/28/22 1556 07/29/22 0444 07/30/22 0758 07/31/22 0025 08/01/22 0847 08/02/22 0041  NA 144 144 142 137 140 139  K 3.4* 3.2* 3.7 3.7 3.0* 4.1  CL 106 113* 112* 105 106 106  CO2 '26 25 23 22 24 24  '$ GLUCOSE 117* 118* 96 172* 102* 113*  BUN 32* 30* '19 15 9 9  '$ CREATININE 2.44* 1.51* 1.14* 0.96 0.95 1.03*  CALCIUM 7.9* 8.1* 8.6* 8.4* 8.5* 8.4*  MG 2.3  --   --  1.7 1.5* 1.8  PHOS  --   --   --  4.1 3.6 2.6    GFR: Estimated Creatinine Clearance: 37.3 mL/min (Glenda Nolan) (by C-G formula based on SCr of 1.03 mg/dL (H)).  Liver Function Tests: Recent Labs  Lab 07/29/22 0444 07/30/22 0758 07/31/22 0025 08/01/22 0847 08/02/22 0041  AST 32 23 20 39 25  ALT 44 32 27 37 32  ALKPHOS 121 117 100 86 87  BILITOT 1.7* 1.3* 1.1 0.9 0.7  PROT 5.9* 5.6* 5.3* 5.6* 5.4*  ALBUMIN 2.6* 2.3* 2.2* 2.3* 2.3*    CBG: Recent Labs  Lab 08/01/22 1327 08/01/22 1859 08/01/22 2304 08/02/22 0608 08/02/22 1145  GLUCAP 103* 138* 86 83 138*     Recent Results (from the past 240 hour(s))  Culture, blood (Routine X 2) w Reflex to ID Panel     Status: None   Collection Time: 07/28/22  9:22 PM   Specimen: BLOOD  Result Value Ref Range Status   Specimen Description BLOOD LEFT ANTECUBITAL  Final   Special Requests   Final    BOTTLES DRAWN AEROBIC AND  ANAEROBIC Blood Culture adequate volume   Culture   Final    NO GROWTH 5 DAYS Performed at [facility], [address] [place],  [address]    Report Status 08/02/2022 FINAL  Final  Culture, blood (Routine X 2) w Reflex to ID Panel     Status: None   Collection Time: 07/28/22  9:22 PM   Specimen: BLOOD  Result Value Ref Range Status   Specimen Description BLOOD BLOOD LEFT WRIST  Final   Special Requests   Final    BOTTLES DRAWN AEROBIC AND ANAEROBIC Blood Culture adequate volume   Culture   Final    NO GROWTH 5 DAYS Performed at [facility], [address], [place],  [place] [address]    Report Status 08/02/2022 FINAL  Final         Radiology Studies: No results found.      Scheduled Meds:  donepezil  10 mg Oral QHS   fluconazole  100 mg Oral Daily   haloperidol lactate  2.5 mg Intravenous Once   heparin  5,000 Units Subcutaneous Q8H   insulin aspart  0-9 Units Subcutaneous Q6H   levothyroxine  75 mcg Oral Q0600   metoprolol tartrate  100 mg Oral BID   pantoprazole  40 mg Oral Daily   raloxifene  60 mg Oral QPC breakfast   Continuous Infusions:  piperacillin-tazobactam (ZOSYN)  IV 3.375 g (08/02/22 1150)     LOS: 5 days    Time spent: over 30 min    Fayrene Helper, MD [facility]   To contact the attending provider between 7A-7P or the covering provider during after hours 7P-7A, please log into the web site [email or web address] and access using universal [place] password for that web site. If you do not have the password, please call the hospital operator.  08/02/2022, 3:22 PM

## 2022-08-02 NOTE — Progress Notes (Signed)
Mobility Specialist Progress Note:   08/02/22 1440  Mobility  Activity Ambulated with assistance in hallway  Level of Assistance Minimal assist, patient does 75% or more  Assistive Device Front wheel walker  Distance Ambulated (ft) 120 ft  Activity Response Tolerated well  $Mobility charge 1 Mobility   Pt agreeable to mobility session. Required minA to stand from recliner, minG during ambulation. Pt left in bed with all needs met, bed alarm on. Sitter and daughter in room.  Nelta Numbers Acute Rehab Secure Chat or Office Phone: [phone]

## 2022-08-02 NOTE — TOC Progression Note (Signed)
Transition of Care [facility]) - Progression Note    Patient Details  Name: Glenda Nolan MRN: [id number] Date of Birth: Feb 22, 1935  Transition of [facility]) CM/SW Contact  Reece Agar, [place] Phone Number: 08/02/2022, 10:25 AM  Clinical Narrative:    Pt lives at home alone in [place], she has a daughter and niece that lives close by. Pt has another daughter [name] [phone] ) who is the main contact person but she lives in [place], [place]. [name] stated she visits pt in the hospital daily.  Pt is only oriented to person and is minA, she has family support but will need SNF at DC. Pt was independent PTA, now needs minA and a RW for ambulation. Pt walked 39f with PT but required modA to stand.  Pt daughter gave CSW permission to fax pt out to SNF in the area.    Expected Discharge Plan: [place] to [place]charge: Continued Medical Work up  Expected Discharge Plan and Services Expected Discharge Plan: [place]  Discharge Planning Services: CM Consult   Living arrangements for the past 2 months: Apartment                                       Social Determinants of Health (SDOH) Interventions    Readmission Risk Interventions     No data to display

## 2022-08-03 DIAGNOSIS — K8050 Calculus of bile duct without cholangitis or cholecystitis without obstruction: Secondary | ICD-10-CM | POA: Diagnosis not present

## 2022-08-03 LAB — GLUCOSE, CAPILLARY
Glucose-Capillary: 115 mg/dL — ABNORMAL HIGH (ref 70–99)
Glucose-Capillary: 131 mg/dL — ABNORMAL HIGH (ref 70–99)
Glucose-Capillary: 139 mg/dL — ABNORMAL HIGH (ref 70–99)
Glucose-Capillary: 89 mg/dL (ref 70–99)

## 2022-08-03 NOTE — Progress Notes (Signed)
PROGRESS NOTE    Glenda Nolan  FWY:[id number] DOB: 03/26/1935 DOA: 07/28/2022 PCP: Celene Squibb, MD  Chief Complaint  Patient presents with   Diarrhea    Brief Narrative:  87 year old female with history of dementia, hypertension, diabetes, brought to the hospital with worsening weakness and diarrhea.  Imaging of her abdomen indicated choledocholithiasis with biliary ductal dilatation.  LFTs were noted to be elevated.  She had AKI with elevated creatinine.  Started on IV fluids and intravenous antibiotics.  Case was reviewed with [facility] and [facility] who agreed with [facility] for [facility] evaluation     Assessment & Plan:   Active Problems:   Choledocholithiasis   Diarrhea   AKI (acute kidney injury) ([place])   Essential hypertension   Dementia (HCC)   Diabetes ([place])   Hypokalemia   Assessment and Plan: Choledocholithiasis CT abd/pelvis with choledocholithiasis resulting in moderate extrahepatic biliary dilatation, minimal cholelithiasis RUQ [place] with cholelithiasis without cholecystitis Intra and extrahepatic biliary ductal dilatation concerning for choledocholithiasis  LFT's overall improved WBC 28 on presentation, improving Continue antibiotics Appreciate GI, s/p ERCP with biliary sphicterotomy and balloon extraction, distal esophageal stricture, periampullatory diverticulum.  Recommending avoiding aspirin, NSAIDs x2 weeks, abx x7 days for cholangitis.   General surgery c/s, risks thought to outweigh benefit Lives home alone, walks without cane/walker in home (though she has them and is supposed to use them).  Dresses herself, needs help with bathing.  No hx MI or CVA or HF.   Diarrhea Will hold on c diff for now, monitor - improving  AKI (acute kidney injury) ([place]) Peaked at 2.44 Improving Hold lasix and telmisartan   Hypokalemia  Hypomagnesemia Replace and follow  Essential hypertension Stable. -Hold telmisartan and Lasix for now with AKI -Resume  metoprolol  Dementia (HCC) Donepezil Delirium precautions Complicated by delirium overnight - encouraged family to stay if possible   Diabetes ([place]) Hgba1c 6.8 Hold metformin SSi- S q6h   MASD  Intertrigo Likely related to diarrhea Will continue to monitor  Wound c/s  Will give fluconazole   DVT prophylaxis: heparin Code Status: full Family Communication: granddaughter Disposition:   Status is: Inpatient Remains inpatient appropriate because: need for SNF placement   Consultants:  GI  Procedures:  ERCP  Antimicrobials:  Anti-infectives (From admission, onward)    Start     Dose/Rate Route Frequency Ordered Stop   08/02/22 1230  fluconazole (DIFLUCAN) tablet 100 mg        100 mg Oral Daily 08/02/22 1135     07/29/22 1100  piperacillin-tazobactam (ZOSYN) IVPB 3.375 g        3.375 g 12.5 mL/hr over 240 Minutes Intravenous Every 8 hours 07/29/22 1039     07/29/22 1030  piperacillin-tazobactam (ZOSYN) 2.25 g in sodium chloride 0.9 % 50 mL IVPB  Status:  Discontinued        2.25 g 100 mL/hr over 30 Minutes Intravenous Every 8 hours 07/29/22 1009 07/29/22 1039   07/28/22 1800  piperacillin-tazobactam (ZOSYN) IVPB 2.25 g  Status:  Discontinued        2.25 g 100 mL/hr over 30 Minutes Intravenous Every 8 hours 07/28/22 1750 07/29/22 1009       Subjective: Remains pleasantly confused   Objective: Vitals:   08/01/22 1543 08/01/22 1946 08/02/22 0407 08/02/22 0725  BP: 127/61 123/66 137/67 (!) 147/76  Pulse: 66 73 60 62  Resp: '16 17 17 17  '$ Temp: 98.4 F (36.9 C) 98.2 F (36.8 C) 98.3 F (  36.8 C) 98.3 F (36.8 C)  TempSrc: Oral Oral Oral Oral  SpO2: 99% 99% 97% 98%  Weight:      Height:        Intake/Output Summary (Last 24 hours) at 08/02/2022 1522 Last data filed at 08/02/2022 1230 Gross per 24 hour  Intake 947.22 ml  Output --  Net 947.22 ml   Filed Weights   07/28/22 1143  Weight: 65.8 kg    Examination:  General: No acute  distress. Cardiovascular: RRR Lungs: unlabored Abdomen: Soft, nontender, nondistended  Neurological: disoriented, moving all extremities  Extremities: No clubbing or cyanosis. No edema.   Data Reviewed: I have personally reviewed following labs and imaging studies  CBC: Recent Labs  Lab 07/28/22 1556 07/29/22 0444 07/30/22 0758 07/31/22 0025 08/01/22 0847 08/02/22 0041  WBC 28.0* 19.9* 12.5* 11.8* 9.7 11.5*  NEUTROABS 24.2*  --   --  10.1* 6.0 6.9  HGB 11.5* 10.7* 10.9* 10.8* 10.8* 10.7*  HCT 34.5* 32.6* 33.7* 32.2* 32.7* 32.8*  MCV 91.8 92.4 92.3 90.7 90.8 91.9  PLT 174 158 186 187 200 381    Basic Metabolic Panel: Recent Labs  Lab 07/28/22 1556 07/29/22 0444 07/30/22 0758 07/31/22 0025 08/01/22 0847 08/02/22 0041  NA 144 144 142 137 140 139  K 3.4* 3.2* 3.7 3.7 3.0* 4.1  CL 106 113* 112* 105 106 106  CO2 '26 25 23 22 24 24  '$ GLUCOSE 117* 118* 96 172* 102* 113*  BUN 32* 30* '19 15 9 9  '$ CREATININE 2.44* 1.51* 1.14* 0.96 0.95 1.03*  CALCIUM 7.9* 8.1* 8.6* 8.4* 8.5* 8.4*  MG 2.3  --   --  1.7 1.5* 1.8  PHOS  --   --   --  4.1 3.6 2.6    GFR: Estimated Creatinine Clearance: 37.3 mL/min (Glenda Nolan) (by C-G formula based on SCr of 1.03 mg/dL (H)).  Liver Function Tests: Recent Labs  Lab 07/29/22 0444 07/30/22 0758 07/31/22 0025 08/01/22 0847 08/02/22 0041  AST 32 23 20 39 25  ALT 44 32 27 37 32  ALKPHOS 121 117 100 86 87  BILITOT 1.7* 1.3* 1.1 0.9 0.7  PROT 5.9* 5.6* 5.3* 5.6* 5.4*  ALBUMIN 2.6* 2.3* 2.2* 2.3* 2.3*    CBG: Recent Labs  Lab 08/01/22 1327 08/01/22 1859 08/01/22 2304 08/02/22 0608 08/02/22 1145  GLUCAP 103* 138* 86 83 138*     Recent Results (from the past 240 hour(s))  Culture, blood (Routine X 2) w Reflex to ID Panel     Status: None   Collection Time: 07/28/22  9:22 PM   Specimen: BLOOD  Result Value Ref Range Status   Specimen Description BLOOD LEFT ANTECUBITAL  Final   Special Requests   Final    BOTTLES DRAWN AEROBIC AND  ANAEROBIC Blood Culture adequate volume   Culture   Final    NO GROWTH 5 DAYS Performed at [facility], [address] [place], [place] [address]    Report Status 08/02/2022 FINAL  Final  Culture, blood (Routine X 2) w Reflex to ID Panel     Status: None   Collection Time: 07/28/22  9:22 PM   Specimen: BLOOD  Result Value Ref Range Status   Specimen Description BLOOD BLOOD LEFT WRIST  Final   Special Requests   Final    BOTTLES DRAWN AEROBIC AND ANAEROBIC Blood Culture adequate volume   Culture   Final    NO GROWTH 5 DAYS Performed at [facility], [address], [place],  [place] [address]    Report Status 08/02/2022 FINAL  Final         Radiology Studies: No results found.      Scheduled Meds:  donepezil  10 mg Oral QHS   fluconazole  100 mg Oral Daily   haloperidol lactate  2.5 mg Intravenous Once   heparin  5,000 Units Subcutaneous Q8H   insulin aspart  0-9 Units Subcutaneous Q6H   levothyroxine  75 mcg Oral Q0600   metoprolol tartrate  100 mg Oral BID   pantoprazole  40 mg Oral Daily   raloxifene  60 mg Oral QPC breakfast   Continuous Infusions:  piperacillin-tazobactam (ZOSYN)  IV 3.375 g (08/02/22 1150)     LOS: 5 days    Time spent: over 30 min    Fayrene Helper, MD [facility]   To contact the attending provider between 7A-7P or the covering provider during after hours 7P-7A, please log into the web site [email or web address] and access using universal [place] password for that web site. If you do not have the password, please call the hospital operator.  08/02/2022, 3:22 PM

## 2022-08-04 DIAGNOSIS — K8050 Calculus of bile duct without cholangitis or cholecystitis without obstruction: Secondary | ICD-10-CM | POA: Diagnosis not present

## 2022-08-04 LAB — CBC WITH DIFFERENTIAL/PLATELET
Abs Immature Granulocytes: 0.23 10*3/uL — ABNORMAL HIGH (ref 0.00–0.07)
Basophils Absolute: 0.1 10*3/uL (ref 0.0–0.1)
Basophils Relative: 1 %
Eosinophils Absolute: 0.5 10*3/uL (ref 0.0–0.5)
Eosinophils Relative: 5 %
HCT: 32.6 % — ABNORMAL LOW (ref 36.0–46.0)
Hemoglobin: 10.7 g/dL — ABNORMAL LOW (ref 12.0–15.0)
Immature Granulocytes: 2 %
Lymphocytes Relative: 19 %
Lymphs Abs: 1.8 10*3/uL (ref 0.7–4.0)
MCH: 30.5 pg (ref 26.0–34.0)
MCHC: 32.8 g/dL (ref 30.0–36.0)
MCV: 92.9 fL (ref 80.0–100.0)
Monocytes Absolute: 0.6 10*3/uL (ref 0.1–1.0)
Monocytes Relative: 7 %
Neutro Abs: 6.3 10*3/uL (ref 1.7–7.7)
Neutrophils Relative %: 66 %
Platelets: 256 10*3/uL (ref 150–400)
RBC: 3.51 MIL/uL — ABNORMAL LOW (ref 3.87–5.11)
RDW: 13.5 % (ref 11.5–15.5)
WBC: 9.4 10*3/uL (ref 4.0–10.5)
nRBC: 0 % (ref 0.0–0.2)

## 2022-08-04 LAB — COMPREHENSIVE METABOLIC PANEL
ALT: 22 U/L (ref 0–44)
AST: 16 U/L (ref 15–41)
Albumin: 2.3 g/dL — ABNORMAL LOW (ref 3.5–5.0)
Alkaline Phosphatase: 70 U/L (ref 38–126)
Anion gap: 6 (ref 5–15)
BUN: <5 mg/dL — ABNORMAL LOW (ref 8–23)
CO2: 27 mmol/L (ref 22–32)
Calcium: 8.5 mg/dL — ABNORMAL LOW (ref 8.9–10.3)
Chloride: 106 mmol/L (ref 98–111)
Creatinine, Ser: 0.91 mg/dL (ref 0.44–1.00)
GFR, Estimated: >60 mL/min (ref >60)
Glucose, Bld: 96 mg/dL (ref 70–99)
Potassium: 3.6 mmol/L (ref 3.5–5.1)
Sodium: 139 mmol/L (ref 135–145)
Total Bilirubin: 0.7 mg/dL (ref 0.3–1.2)
Total Protein: 5.6 g/dL — ABNORMAL LOW (ref 6.5–8.1)

## 2022-08-04 LAB — GLUCOSE, CAPILLARY
Glucose-Capillary: 88 mg/dL (ref 70–99)
Glucose-Capillary: 106 mg/dL — ABNORMAL HIGH (ref 70–99)
Glucose-Capillary: 123 mg/dL — ABNORMAL HIGH (ref 70–99)
Glucose-Capillary: 110 mg/dL — ABNORMAL HIGH (ref 70–99)

## 2022-08-04 LAB — MAGNESIUM: Magnesium: 1.8 mg/dL (ref 1.7–2.4)

## 2022-08-04 LAB — PHOSPHORUS: Phosphorus: 3.5 mg/dL (ref 2.5–4.6)

## 2022-08-04 NOTE — Progress Notes (Signed)
Pharmacy Antibiotic Note  CESAR ALF is a 87 y.o. female admitted on 07/28/2022 with diarrhea. CT A/P with choledochololithiasis resulting in moderate extrahepatic biliary dilatation with minimal cholelithiasis. Pharmacy has been consulted for fluconazole dosing for intertrigo.   Plan: Continue Zosyn through 9/11 to complete 7 days of therapy  Continue fluconazole 100 mg PO daily through 9/13 to complete 5 days of therapy Consider topicals for intertrigo if needed.  Pharmacy will sign off as renal function is stable and stop dates are entered on orders.  Height: '5\' 4"'$  (162.6 cm) Weight: 65.8 kg (145 lb) IBW/kg (Calculated) : 54.7  Temp (24hrs), Avg:98.1 F (36.7 C), Min:97.8 F (36.6 C), Max:98.5 F (36.9 C)  Recent Labs  Lab 07/28/22 2122 07/29/22 0444 07/30/22 0758 07/31/22 0025 08/01/22 0847 08/02/22 0041 08/04/22 0824  WBC  --    < > 12.5* 11.8* 9.7 11.5* 9.4  CREATININE  --    < > 1.14* 0.96 0.95 1.03* 0.91  LATICACIDVEN 1.3  --   --   --   --   --   --    < > = values in this interval not displayed.     Estimated Creatinine Clearance: 42.2 mL/min (by C-G formula based on SCr of 0.91 mg/dL).    No Known Allergies  Antimicrobials this admission: Zosyn 9/4 >> 9/11 Fluc 9/9>> 9/13  Microbiology results: 9/4 BCx: NGTD  Thank you for allowing pharmacy to be a part of this patient's care.   Erskine Speed, PharmD Clinical Pharmacist 08/04/2022 9:51 AM

## 2022-08-04 NOTE — Progress Notes (Signed)
Glenda Nolan Marketing executive notified that the [employer] cabin crew order for the video surveillance monitor should be renewed Q12 hours. Glenda Nolan'

## 2022-08-04 NOTE — TOC Progression Note (Addendum)
Transition of Care [facility]) - Progression Note    Patient Details  Name: Glenda Nolan MRN: [id number] Date of Birth: Mar 06, 1935  Transition of Care [facility]) CM/SW [place], [place] Phone Number: 08/04/2022, 12:01 PM  Clinical Narrative:      Update 5:30pm- CSW received insurance authorization approval for patient. [place] ID# [id number]. [place] insurance authorization has been approved from 9/11-9/13.CSW spoke with Glenda Nolan with [facility] who confirmed she can accept patient tomorrow if medically ready. CSW informed MD.   Update 2:22pm- CSW spoke with Glenda Nolan with [facility]  who confirmed can accept patient for SNF placement when medically ready. Insurance authorization pending. CSW will continue to follow.  Update:1:29pm- CSW spoke with Glenda Nolan patients daughter and provided SNF bed offers for patient. Patients daughter accepted SNF bed offer with [facility].  CSW spoke with Glenda Nolan with [facility] who is checking to confirm that they have a female bed available. CSW awaiting callback. CSW started insurance authorization for patient [facility] ID# [id number].  CSW called patients daughter Glenda Nolan and LVM. CSW awaiting callback to discuss short term rehab bed offers for patient. Patients passr was approved. CSW will continue to follow and assist with patients dc planning needs.  Expected Discharge Plan: [place] Barriers to Discharge: Continued Medical Work up  Expected Discharge Plan and Services Expected Discharge Plan: [place]   Discharge Planning Services: CM Consult   Living arrangements for the past 2 months: Apartment                                       Social Determinants of Health (SDOH) Interventions    Readmission Risk Interventions     No data to display

## 2022-08-04 NOTE — Progress Notes (Signed)
PROGRESS NOTE    Glenda Nolan  KVQ:[id number] DOB: 11-21-1935 DOA: 07/28/2022 PCP: Celene Squibb, MD  Chief Complaint  Patient presents with   Diarrhea    Brief Narrative:  87 year old female with history of dementia, hypertension, diabetes, brought to the hospital with worsening weakness and diarrhea.  Imaging of her abdomen indicated choledocholithiasis with biliary ductal dilatation.  LFTs were noted to be elevated.  She had AKI with elevated creatinine.  Started on IV fluids and intravenous antibiotics.  Case was reviewed with [facility] and [facility] who agreed with [facility] for [facility]     Assessment & Plan:   Active Problems:   Choledocholithiasis   Diarrhea   AKI (acute kidney injury) ([place])   Essential hypertension   Dementia (HCC)   Diabetes ([place])   Hypokalemia   Assessment and Plan: Choledocholithiasis CT abd/pelvis with choledocholithiasis resulting in moderate extrahepatic biliary dilatation, minimal cholelithiasis RUQ [place] with cholelithiasis without cholecystitis Intra and extrahepatic biliary ductal dilatation concerning for choledocholithiasis  LFT's overall improved WBC 28 on presentation, improving Continue antibiotics Appreciate GI, s/p ERCP with biliary sphicterotomy and balloon extraction, distal esophageal stricture, periampullatory diverticulum.  Recommending avoiding aspirin, NSAIDs x2 weeks, abx x7 days for cholangitis.   General surgery c/s, risks thought to outweigh benefit Lives home alone, walks without cane/walker in home (though she has them and is supposed to use them).  Dresses herself, needs help with bathing.  No hx MI or CVA or HF.   Diarrhea Will hold on c diff for now, monitor - improving  AKI (acute kidney injury) ([place]) Peaked at 2.44 Improving Hold lasix and telmisartan   Hypokalemia  Hypomagnesemia Replace and follow  Essential hypertension Stable. -Hold telmisartan and Lasix for now with AKI -Resume  metoprolol  Dementia (HCC) Donepezil Delirium precautions Complicated by delirium overnight - encouraged family to stay if possible   Diabetes ([place]) Hgba1c 6.8 Hold metformin SSi- S q6h   MASD  Intertrigo Likely related to diarrhea Will continue to monitor  Wound c/s  S/p fluconazole   DVT prophylaxis: heparin Code Status: full Family Communication: daughter at bedside Disposition:   Status is: Inpatient Remains inpatient appropriate because: need for SNF placement   Consultants:  GI  Procedures:  ERCP  Antimicrobials:  Anti-infectives (From admission, onward)    Start     Dose/Rate Route Frequency Ordered Stop   08/02/22 1230  fluconazole (DIFLUCAN) tablet 100 mg        100 mg Oral Daily 08/02/22 1135 08/07/22 0959   07/29/22 1100  piperacillin-tazobactam (ZOSYN) IVPB 3.375 g        3.375 g 12.5 mL/hr over 240 Minutes Intravenous Every 8 hours 07/29/22 1039 08/05/22 0259   07/29/22 1030  piperacillin-tazobactam (ZOSYN) 2.25 g in sodium chloride 0.9 % 50 mL IVPB  Status:  Discontinued        2.25 g 100 mL/hr over 30 Minutes Intravenous Every 8 hours 07/29/22 1009 07/29/22 1039   07/28/22 1800  piperacillin-tazobactam (ZOSYN) IVPB 2.25 g  Status:  Discontinued        2.25 g 100 mL/hr over 30 Minutes Intravenous Every 8 hours 07/28/22 1750 07/29/22 1009       Subjective: No complaints  Objective: Vitals:   08/04/22 0523 08/04/22 0547 08/04/22 0750 08/04/22 1048  BP: (!) 154/120 (!) 146/99  136/68  Pulse: 66 (!) 58  67  Resp:  18  18  Temp: 98.5 F (36.9 C) 97.8 F (36.6 C)  TempSrc: Axillary Oral    SpO2: 97% 100% 99% 99%  Weight:      Height:        Intake/Output Summary (Last 24 hours) at 08/04/2022 1739 Last data filed at 08/04/2022 1309 Gross per 24 hour  Intake 1020 ml  Output 1 ml  Net 1019 ml   Filed Weights   07/28/22 1143  Weight: 65.8 kg    Examination:  General: No acute distress. Cardiovascular: RRR Lungs:  unlabored Abdomen: Soft, nontender, nondistended Neurological: pleasantly confused, moving all extremities Extremities: No clubbing or cyanosis. No edema.  Data Reviewed: I have personally reviewed following labs and imaging studies  CBC: Recent Labs  Lab 07/30/22 0758 07/31/22 0025 08/01/22 0847 08/02/22 0041 08/04/22 0824  WBC 12.5* 11.8* 9.7 11.5* 9.4  NEUTROABS  --  10.1* 6.0 6.9 6.3  HGB 10.9* 10.8* 10.8* 10.7* 10.7*  HCT 33.7* 32.2* 32.7* 32.8* 32.6*  MCV 92.3 90.7 90.8 91.9 92.9  PLT 186 187 200 219 185    Basic Metabolic Panel: Recent Labs  Lab 07/30/22 0758 07/31/22 0025 08/01/22 0847 08/02/22 0041 08/04/22 0824  NA 142 137 140 139 139  K 3.7 3.7 3.0* 4.1 3.6  CL 112* 105 106 106 106  CO2 '23 22 24 24 27  '$ GLUCOSE 96 172* 102* 113* 96  BUN '19 15 9 9 '$ <5*  CREATININE 1.14* 0.96 0.95 1.03* 0.91  CALCIUM 8.6* 8.4* 8.5* 8.4* 8.5*  MG  --  1.7 1.5* 1.8 1.8  PHOS  --  4.1 3.6 2.6 3.5    GFR: Estimated Creatinine Clearance: 42.2 mL/min (by C-G formula based on SCr of 0.91 mg/dL).  Liver Function Tests: Recent Labs  Lab 07/30/22 0758 07/31/22 0025 08/01/22 0847 08/02/22 0041 08/04/22 0824  AST 23 20 39 25 16  ALT 32 27 37 32 22  ALKPHOS 117 100 86 87 70  BILITOT 1.3* 1.1 0.9 0.7 0.7  PROT 5.6* 5.3* 5.6* 5.4* 5.6*  ALBUMIN 2.3* 2.2* 2.3* 2.3* 2.3*    CBG: Recent Labs  Lab 08/03/22 1806 08/03/22 2335 08/04/22 0520 08/04/22 1041 08/04/22 1347  GLUCAP 115* 131* 88 106* 123*     Recent Results (from the past 240 hour(s))  Culture, blood (Routine X 2) w Reflex to ID Panel     Status: None   Collection Time: 07/28/22  9:22 PM   Specimen: BLOOD  Result Value Ref Range Status   Specimen Description BLOOD LEFT ANTECUBITAL  Final   Special Requests   Final    BOTTLES DRAWN AEROBIC AND ANAEROBIC Blood Culture adequate volume   Culture   Final    NO GROWTH 5 DAYS Performed at [facility], [address], [place], [place] [address]    Report  Status 08/02/2022 FINAL  Final  Culture, blood (Routine X 2) w Reflex to ID Panel     Status: None   Collection Time: 07/28/22  9:22 PM   Specimen: BLOOD  Result Value Ref Range Status   Specimen Description BLOOD BLOOD LEFT WRIST  Final   Special Requests   Final    BOTTLES DRAWN AEROBIC AND ANAEROBIC Blood Culture adequate volume   Culture   Final    NO GROWTH 5 DAYS Performed at [facility], [address], [place], [place] [address]    Report Status 08/02/2022 FINAL  Final         Radiology Studies: No results found.      Scheduled Meds:  donepezil  10 mg Oral QHS  fluconazole  100 mg Oral Daily   heparin  5,000 Units Subcutaneous Q8H   insulin aspart  0-9 Units Subcutaneous Q6H   levothyroxine  75 mcg Oral Q0600   metoprolol tartrate  100 mg Oral BID   pantoprazole  40 mg Oral Daily   raloxifene  60 mg Oral QPC breakfast   Continuous Infusions:  piperacillin-tazobactam (ZOSYN)  IV 3.375 g (08/04/22 1041)     LOS: 7 days    Time spent: over 30 min    Fayrene Helper, MD [facility]   To contact the attending provider between 7A-7P or the covering provider during after hours 7P-7A, please log into the web site [email or web address] and access using universal [place] password for that web site. If you do not have the password, please call the hospital operator.  08/04/2022, 5:39 PM

## 2022-08-04 NOTE — Progress Notes (Signed)
Physical Therapy Treatment Patient Details Name: Glenda Nolan MRN: [id number] DOB: 1934-12-25 Today's Date: 08/04/2022   History of Present Illness 87 year old female admitted for choledocholithiasis with biliary ductal dilatation and AKI. ERCP, sphincterotomy and stone removal on 9/6. PMH: dementia, anemia, hx of colon CA, diverticulosis.    PT Comments    Pt admitted with above diagnosis. Pt was able to ambulate and incr distance today with min guard assist and mod cues.  Pt incontinent of urine and BM and needed assist to be cleaned. Continue to recommend SNF for therapy as pt does not appear to be able to care for herself as PTA.  Updated frequency to 2 x week as this is appropriate freq for SNF pt.   Pt currently with functional limitations due to balance and endurance deficits. Pt will benefit from skilled PT to increase their independence and safety with mobility to allow discharge to the venue listed below.      Recommendations for follow up therapy are one component of a multi-disciplinary discharge planning process, led by the attending physician.  Recommendations may be updated based on patient status, additional functional criteria and insurance authorization.  Follow Up Recommendations  Skilled nursing-short term rehab (<3 hours/day) Can patient physically be transported by private vehicle: Yes   Assistance Recommended at Discharge Frequent or constant Supervision/Assistance  Patient can return home with the following A lot of help with walking and/or transfers;A lot of help with bathing/dressing/bathroom;Assistance with cooking/housework;Assist for transportation;Help with stairs or ramp for entrance;Direct supervision/assist for medications management;Direct supervision/assist for financial management   Equipment Recommendations  None recommended by PT    Recommendations for Other Services       Precautions / Restrictions Precautions Precautions: Fall Restrictions Weight  Bearing Restrictions: No     Mobility  Bed Mobility Overal bed mobility: Needs Assistance Bed Mobility: Supine to Sit, Sit to Supine, Rolling Rolling: Min assist   Supine to sit: HOB elevated, Min assist     General bed mobility comments: On arrival, after waking pt up, noted that bed soaked with urine and pt actively urinating with the bed wet all the way to her feet.  Cleaned pt and changed her socks.  Pt needing incr time to get pt to sit up but less assist than prior visit.  Cleaned pts body and changed gown.    Transfers Overall transfer level: Needs assistance Equipment used: Rolling walker (2 wheels) Transfers: Sit to/from Stand Sit to Stand: Min assist           General transfer comment: Min assist to stand with RW. Cleaned pts perineal and buttocks as well as back of legs once she stood.  Noted BM as well therefore asked pt if she wanted to walk to bathroom.    Ambulation/Gait Ambulation/Gait assistance: Min guard, Min assist Gait Distance (Feet): 170 Feet (20 feet, 150 feet) Assistive device: Rolling walker (2 wheels) Gait Pattern/deviations: Step-through pattern, Decreased stride length, Trunk flexed, Decreased step length - left   Gait velocity interpretation: <1.31 ft/sec, indicative of household ambulator   General Gait Details: Pt ambulated to bathroom and had BM and urinated.  Then pt ambulated in hallway with progression of gait.  Pt needed cues intiially with sequencing steps and RW but once she got started needing less cues.  Pt with decr step length left LE and as she fatigues, she flexes at trunk.  Once up, pt more steady with RW but still needs guard assist and min to mod cues for  safety at current level.   Stairs             Wheelchair Mobility    Modified Rankin (Stroke Patients Only)       Balance Overall balance assessment: Needs assistance Sitting-balance support: Feet supported, Bilateral upper extremity supported Sitting  balance-Leahy Scale: Fair     Standing balance support: Bilateral upper extremity supported Standing balance-Leahy Scale: Poor Standing balance comment: relies on RW and external support                            Cognition Arousal/Alertness: Awake/alert Behavior During Therapy: WFL for tasks assessed/performed, Flat affect Overall Cognitive Status: History of cognitive impairments - at baseline Area of Impairment: Attention, Orientation, Memory, Following commands, Safety/judgement, Awareness, Problem solving                 Orientation Level: Disoriented to, Time, Situation Current Attention Level: Sustained Memory: Decreased short-term memory Following Commands: Follows one step commands with increased time Safety/Judgement: Decreased awareness of safety, Decreased awareness of deficits Awareness: Intellectual Problem Solving: Slow processing, Decreased initiation, Difficulty sequencing, Requires verbal cues, Requires tactile cues General Comments: hx of dementia at baseline, decreased ability to recall reason for being in hospital. slow processing and difficulty sequencing, mumbled speech so difficult to fully assess.        Exercises General Exercises - Lower Extremity Ankle Circles/Pumps: AROM, Both, 5 reps, Supine Long Arc Quad: AROM, Both, 10 reps, Seated Hip Flexion/Marching: AROM, Both, 10 reps, Seated    General Comments General comments (skin integrity, edema, etc.): NT sitting in room and when wiping pt noted some blood noting a sore in buttocks area. Notified nursing.      Pertinent Vitals/Pain Pain Assessment Pain Assessment: No/denies pain    Home Living                          Prior Function            PT Goals (current goals can now be found in the care plan section) Acute Rehab PT Goals Patient Stated Goal: to go home Progress towards PT goals: Progressing toward goals    Frequency    Min 2X/week      PT Plan  Frequency needs to be updated    Co-evaluation              AM-PAC PT "6 Clicks" Mobility   Outcome Measure  Help needed turning from your back to your side while in a flat bed without using bedrails?: A Little Help needed moving from lying on your back to sitting on the side of a flat bed without using bedrails?: A Lot Help needed moving to and from a bed to a chair (including a wheelchair)?: A Little Help needed standing up from a chair using your arms (e.g., wheelchair or bedside chair)?: A Little Help needed to walk in hospital room?: A Lot Help needed climbing 3-5 steps with a railing? : A Lot 6 Click Score: 15    End of Session Equipment Utilized During Treatment: Gait belt Activity Tolerance: Patient limited by fatigue Patient left: in chair;with call bell/phone within reach;with chair alarm set Nurse Communication: Mobility status PT Visit Diagnosis: Muscle weakness (generalized) (M62.81)     Time: 0930-1006 PT Time Calculation (min) (ACUTE ONLY): 36 min  Charges:  $Gait Training: 8-22 mins $Therapeutic Exercise: 8-22 mins  [facility] M,PT Acute Rehab Services [phone]    Alvira Philips 08/04/2022, 12:08 PM

## 2022-08-05 DIAGNOSIS — L8990 Pressure ulcer of unspecified site, unspecified stage: Secondary | ICD-10-CM | POA: Insufficient documentation

## 2022-08-05 DIAGNOSIS — K8050 Calculus of bile duct without cholangitis or cholecystitis without obstruction: Secondary | ICD-10-CM | POA: Diagnosis not present

## 2022-08-05 LAB — GLUCOSE, CAPILLARY
Glucose-Capillary: 102 mg/dL — ABNORMAL HIGH (ref 70–99)
Glucose-Capillary: 132 mg/dL — ABNORMAL HIGH (ref 70–99)
Glucose-Capillary: 136 mg/dL — ABNORMAL HIGH (ref 70–99)
Glucose-Capillary: 184 mg/dL — ABNORMAL HIGH (ref 70–99)

## 2022-08-05 NOTE — Progress Notes (Signed)
Mobility Specialist - Progress Note   08/05/22 1159  Mobility  Activity Ambulated with assistance in hallway  Level of Assistance Contact guard assist, steadying assist  Assistive Device Front wheel walker  Distance Ambulated (ft) 170 ft  Activity Response Tolerated well  $Mobility charge 1 Mobility    Pt received in bed agreeable to mobility. No complaints throughout. Left in recliner w/ call bell in reach and all needs met.   Paulla Dolly Mobility Specialist

## 2022-08-05 NOTE — TOC Progression Note (Signed)
Transition of Care [facility]) - Progression Note    Patient Details  Name: Glenda Nolan MRN: [id number] Date of Birth: 11-14-1935  Transition of Care [facility]) CM/SW [place], [place] Phone Number: 08/05/2022, 9:58 AM  Clinical Narrative:      CSW was informed patient has tele sitter. CSW spoke with Jackelyn Poling at [facility] who confirmed patient will need to be off tele sitter for 24 hours before can dc over. CSW informed RN and MD. CSW will continue to follow and assist with patients dc planning needs. Expected Discharge Plan: [place] Barriers to Discharge: Continued Medical Work up  Expected Discharge Plan and Services Expected Discharge Plan: [place]   Discharge Planning Services: CM Consult   Living arrangements for the past 2 months: Apartment                                       Social Determinants of Health (SDOH) Interventions    Readmission Risk Interventions     No data to display

## 2022-08-05 NOTE — Discharge Summary (Signed)
Physician Discharge Summary  Glenda Nolan YKD:[id number] DOB: 05/04/1935 DOA: 07/28/2022  PCP: Celene Squibb, MD  Admit date: 07/28/2022 Discharge date: 08/05/2022  Time spent: 40 minutes  Recommendations for Outpatient Follow-up:  Follow outpatient CBC/CMP  Avoid aspirin and NSAIDs x2 weeks after ERCP  Delirium precautions  Discharge Diagnoses:  Active Problems:   Choledocholithiasis   Diarrhea   AKI (acute kidney injury) ([place])   Essential hypertension   Dementia (HCC)   Diabetes ([place])   Hypokalemia   Pressure injury of skin   Discharge Condition: stable  Diet recommendation: heart healthy  Filed Weights   07/28/22 1143  Weight: 65.8 kg    History of present illness:  87 year old female with history of dementia, hypertension, diabetes, brought to the hospital with worsening weakness and diarrhea.  Imaging of her abdomen indicated choledocholithiasis with biliary ductal dilatation.  LFTs were noted to be elevated.  She had AKI with elevated creatinine.  Started on IV fluids and intravenous antibiotics.  [facility] and underwent ERCP by GI.  Now s/p 7 days abx.  Surgery was c/w who thought risks of surgery outweighed the benefit.  Course complicated by delirium.  Required sitter for Eashan Schipani few days, needs to be without sitter x24 hrs prior to discharge.  Hopefully will discharge 9/23.  Hospital Course:  Assessment and Plan: Choledocholithiasis  Cholangitis  CT abd/pelvis with choledocholithiasis resulting in moderate extrahepatic biliary dilatation, minimal cholelithiasis RUQ [place] with cholelithiasis without cholecystitis Intra and extrahepatic biliary ductal dilatation concerning for choledocholithiasis  LFT's overall improved WBC 28 on presentation, improved Continue antibiotics Appreciate GI, s/p ERCP with biliary sphicterotomy and balloon extraction, distal esophageal stricture, periampullatory diverticulum.  Recommending avoiding aspirin, NSAIDs x2 weeks, abx  x7 days for cholangitis.   General surgery c/s, risks cholecystectomy thought to outweigh benefit Lives home alone, walks without cane/walker in home (though she has them and is supposed to use them).  Dresses herself, needs help with bathing.  No hx MI or CVA or HF.    Diarrhea Will hold on c diff for now, monitor - improving   AKI (acute kidney injury) ([place]) Wnl  Hypokalemia  Hypomagnesemia wnl   Essential hypertension Stable. Resume home meds   Dementia (HCC) Donepezil Delirium precautions Complicated by delirium overnight - encouraged family to stay if possible - sitter d/c'd, will discharge to SNF after 24 hrs without sitter   Diabetes ([place]) Hgba1c 6.8 Resume metformin at discharge   MASD  Intertrigo Likely related to diarrhea Will continue to monitor  Wound c/s  S/p fluconazole     Procedures: ERCP - The entire biliary tree was dilated, with stones causing an obstruction. - Choledocholithiasis and sludge was found. Complete removal was accomplished by biliary sphincterotomy and balloon extraction. - Periampullary diverticulum. - Distal esophageal stricture. Impression: - Avoid aspirin and nonsteroidal anti-inflammatory medicines for 2 weeks. - Return patient to hospital ward for ongoing care. - Observe patient's clinical course following today's ERCP with therapeutic intervention. - Continue antibiotics for Pang Robers least 7 days for possible cholangitis. - Outpatient GI follow up with Dr. Gala Romney.   Consultations: GI surgery  Discharge Exam: Vitals:   08/05/22 0530 08/05/22 0809  BP: (!) 150/62 136/60  Pulse: 61 64  Resp: 16 18  Temp: 98.5 F (36.9 C) 98.6 F (37 C)  SpO2: 96% 96%   No complaints Daughter at bedside  General: No acute distress.. Sitting up, eating lunch. Lungs: unlabored Neurological: Alert. Moves all extremities 4 with equal  strength. Cranial nerves II through XII grossly intact. Extremities: No clubbing or cyanosis. No edema.    Discharge Instructions   Discharge Instructions     Call MD for:  difficulty breathing, headache or visual disturbances   Complete by: As directed    Call MD for:  extreme fatigue   Complete by: As directed    Call MD for:  hives   Complete by: As directed    Call MD for:  persistant dizziness or light-headedness   Complete by: As directed    Call MD for:  persistant nausea and vomiting   Complete by: As directed    Call MD for:  redness, tenderness, or signs of infection (pain, swelling, redness, odor or green/yellow discharge around incision site)   Complete by: As directed    Call MD for:  severe uncontrolled pain   Complete by: As directed    Call MD for:  temperature >100.4   Complete by: As directed    Diet - low sodium heart healthy   Complete by: As directed    Discharge instructions   Complete by: As directed    You were seen for complications from gallstones.  You've had treatment with an ERCP.  The surgeons discussed the possibility of Dustyn Dansereau cholecystectomy (removal of your gallbladder), but we've decided to defer surgery.   You were treated with 7 days of antibiotics.  [facility] rehab.  Return for new, recurrent, or worsening symptoms.  Please ask your PCP to request records from this hospitalization so they know what was done and what the next steps will be.   Discharge wound care:   Complete by: As directed    Frequent turns, foam dressing   Increase activity slowly   Complete by: As directed       Allergies as of 08/05/2022   No Known Allergies      Medication List     TAKE these medications    acetaminophen 325 MG tablet Commonly known as: TYLENOL Take 325 mg by mouth daily after lunch.   alendronate 70 MG tablet Commonly known as: FOSAMAX Take 70 mg by mouth every Monday.   aspirin EC 325 MG tablet Take 1 tablet (325 mg total) by mouth daily with breakfast.   cholecalciferol 25 MCG (1000  UNIT) tablet Commonly known as: VITAMIN D3 Take 1,000 Units by mouth daily after lunch.   docusate sodium 100 MG capsule Commonly known as: COLACE Take 1 capsule (100 mg total) by mouth 2 (two) times daily.   donepezil 5 MG tablet Commonly known as: ARICEPT Take 10 mg by mouth at bedtime.   furosemide 20 MG tablet Commonly known as: LASIX Take 40 mg by mouth daily.   levothyroxine 75 MCG tablet Commonly known as: SYNTHROID Take 75 mcg by mouth every morning.   metFORMIN 500 MG tablet Commonly known as: GLUCOPHAGE Take 1 tablet by mouth daily.   metoprolol tartrate 100 MG tablet Commonly known as: LOPRESSOR TAKE (1) TABLET BY MOUTHDAT BREAKFAST THEN TAKE (1) TABLET BY MOUTH AT BEDTIME FOR HEART RATE.   omeprazole 20 MG capsule Commonly known as: PRILOSEC Take 20 mg by mouth daily after breakfast. (0800)   potassium chloride 10 MEQ CR capsule Commonly known as: MICRO-K Take 10 mEq by mouth daily.   PX SENIOR VITAMIN PO Take 1 tablet by mouth daily after lunch.   raloxifene 60 MG tablet Commonly known as: EVISTA Take 60 mg by  mouth daily after breakfast. (0800)   simvastatin 40 MG tablet Commonly known as: ZOCOR Take 40 mg by mouth at bedtime.   telmisartan 80 MG tablet Commonly known as: MICARDIS Take 80 mg by mouth daily.               Discharge Care Instructions  (From admission, onward)           Start     Ordered   08/05/22 0000  Discharge wound care:       Comments: Frequent turns, foam dressing   08/05/22 1511           No Known Allergies  Contact information for follow-up providers     Celene Squibb, MD Follow up.   Specialty: Internal Medicine Contact information: [place] [address] [phone]              Contact information for after-discharge care     [facility] .   Service: Skilled Nursing Contact information: [address] [place] [phone]                      The results of significant diagnostics from this hospitalization (including imaging, microbiology, ancillary and laboratory) are listed below for reference.    Significant Diagnostic Studies: DG ERCP  Result Date: 07/30/2022 CLINICAL DATA:  ERCP for choledocholithiasis. EXAM: ERCP TECHNIQUE: Multiple spot images obtained with the fluoroscopic device and submitted for interpretation post-procedure. FLUOROSCOPY TIME: FLUOROSCOPY TIME 3 minutes, 6 seconds (26.7 mGy) COMPARISON:  CT abdomen and pelvis-07/28/2022 FINDINGS: 16 spot intraoperative fluoroscopic images the right upper abdominal quadrant during ERCP are provided for review Initial image demonstrates ERCP probe overlying the right upper abdominal quadrant. Multiple surgical clips overlie the right mid hemiabdomen Subsequent images demonstrate selective cannulation and opacification of the common bile duct which appears moderate to markedly dilated. There are 2 discrete filling defects within distal aspect of the CBD compatible with provided history of choledocholithiasis. Subsequent images demonstrate insufflation of Linville Decarolis balloon within distal aspect of the CBD with subsequent biliary sweeping and presumed stone extraction and sphincterotomy. IMPRESSION: ERCP with findings of choledocholithiasis with subsequent biliary sweeping and presumed stone extraction and sphincterotomy as above. These images were submitted for radiologic interpretation only. Please see the procedural report for the amount of contrast and the fluoroscopy time utilized. Electronically Signed   By: Sandi Mariscal M.D.   On: 07/30/2022 16:08   US Abdomen Limited RUQ (LIVER/GB)  Result Date: 07/29/2022 CLINICAL DATA:  Right upper quadrant pain for 2 weeks EXAM: ULTRASOUND ABDOMEN LIMITED RIGHT UPPER QUADRANT COMPARISON:  CT abdomen/pelvis 1 day prior FINDINGS: Gallbladder: Shadowing gallstones are seen  measuring up to 1.0 cm. There is no gallbladder wall thickening. Sonographic Murphy's sign was reported negative. Common bile duct: Diameter: 14 mm. There is mild intrahepatic biliary ductal dilatation Liver: No focal lesion identified. Within normal limits in parenchymal echogenicity. Portal vein is patent on color Doppler imaging with normal direction of blood flow towards the liver. Other: None. IMPRESSION: 1. Cholelithiasis without evidence of acute cholecystitis. 2. Intra and extrahepatic biliary ductal dilatation suspicious for choledocholithiasis. Electronically Signed   By: Valetta Mole M.D.   On: 07/29/2022 08:26   CT Abdomen Pelvis Wo Contrast  Result Date: 07/28/2022 CLINICAL DATA:  Acute generalized abdominal pain. EXAM: CT ABDOMEN AND PELVIS WITHOUT CONTRAST TECHNIQUE: Multidetector CT imaging of the abdomen and pelvis  was performed following the standard protocol without IV contrast. RADIATION DOSE REDUCTION: This exam was performed according to the departmental dose-optimization program which includes automated exposure control, adjustment of the mA and/or kV according to patient size and/or use of iterative reconstruction technique. COMPARISON:  July 12, 2022. FINDINGS: Lower chest: No acute abnormality. Hepatobiliary: Liver is unremarkable on these unenhanced images. Minimal cholelithiasis is noted. There appears to be multiple stones in the distal common bile duct resulting in moderate extrahepatic biliary dilatation. Pancreas: Unremarkable. No pancreatic ductal dilatation or surrounding inflammatory changes. Spleen: Normal in size without focal abnormality. Adrenals/Urinary Tract: Adrenal glands are unremarkable. Kidneys are normal, without renal calculi, focal lesion, or hydronephrosis. Bladder is unremarkable. Stomach/Bowel: The stomach is unremarkable. Status post right colectomy. There is no abnormal bowel dilatation or inflammation. Sigmoid diverticulosis is noted without inflammation.  Vascular/Lymphatic: Aortic atherosclerosis. No enlarged abdominal or pelvic lymph nodes. Reproductive: Uterus and bilateral adnexa are unremarkable. Other: No abdominal wall hernia or abnormality. No abdominopelvic ascites. Musculoskeletal: No acute or significant osseous findings. IMPRESSION: Choledocholithiasis is noted resulting in moderate extrahepatic biliary dilatation. Minimal cholelithiasis is noted. Status post right colectomy. Sigmoid diverticulosis without inflammation. Aortic Atherosclerosis (ICD10-I70.0). Electronically Signed   By: Marijo Conception M.D.   On: 07/28/2022 17:15   DG Knee Complete 4 Views Right  Result Date: 07/22/2022 CLINICAL DATA:  Found down, fell EXAM: RIGHT KNEE - COMPLETE 4+ VIEW; LEFT KNEE - COMPLETE 4+ VIEW COMPARISON:  12/11/2008 FINDINGS: Right knee: Frontal, bilateral oblique, lateral views are obtained. No acute fracture, subluxation, or dislocation. There is severe 3 compartmental osteoarthritis, greatest in the medial and lateral compartments. Small suprapatellar joint effusion is likely reactive. Soft tissues are otherwise unremarkable. Left knee: Frontal, bilateral oblique, lateral views are obtained. Three component left knee arthroplasty is identified with no evidence of acute complication. No acute displaced fracture, subluxation, or dislocation. No joint effusion. Soft tissues are unremarkable. IMPRESSION: 1. No acute fracture within either knee. 2. Severe 3 compartmental right knee osteoarthritis with small reactive joint effusion. 3. Unremarkable left knee arthroplasty. Electronically Signed   By: Randa Ngo M.D.   On: 07/22/2022 17:22   DG Knee Complete 4 Views Left  Result Date: 07/22/2022 CLINICAL DATA:  Found down, fell EXAM: RIGHT KNEE - COMPLETE 4+ VIEW; LEFT KNEE - COMPLETE 4+ VIEW COMPARISON:  12/11/2008 FINDINGS: Right knee: Frontal, bilateral oblique, lateral views are obtained. No acute fracture, subluxation, or dislocation. There is severe 3  compartmental osteoarthritis, greatest in the medial and lateral compartments. Small suprapatellar joint effusion is likely reactive. Soft tissues are otherwise unremarkable. Left knee: Frontal, bilateral oblique, lateral views are obtained. Three component left knee arthroplasty is identified with no evidence of acute complication. No acute displaced fracture, subluxation, or dislocation. No joint effusion. Soft tissues are unremarkable. IMPRESSION: 1. No acute fracture within either knee. 2. Severe 3 compartmental right knee osteoarthritis with small reactive joint effusion. 3. Unremarkable left knee arthroplasty. Electronically Signed   By: Randa Ngo M.D.   On: 07/22/2022 17:22   DG Pelvis 1-2 Views  Result Date: 07/22/2022 CLINICAL DATA:  [place], found down EXAM: PELVIS - 1-2 VIEW COMPARISON:  None Available. FINDINGS: Single frontal view of the pelvis includes both hips. No evidence of acute fracture, subluxation, or dislocation. Bilateral hip osteoarthritis, left greater than right. Sacroiliac joints are unremarkable. Moderate degenerative changes at the lumbosacral junction. IMPRESSION: 1. No acute displaced fracture. 2. Bilateral hip osteoarthritis and lower lumbar degenerative changes as above. Electronically Signed  By: Randa Ngo M.D.   On: 07/22/2022 17:20   CT Head Wo Contrast  Result Date: 07/22/2022 CLINICAL DATA:  Head trauma, moderate-severe; Neck trauma (Age >= 65y) EXAM: CT HEAD WITHOUT CONTRAST CT CERVICAL SPINE WITHOUT CONTRAST TECHNIQUE: Multidetector CT imaging of the head and cervical spine was performed following the standard protocol without intravenous contrast. Multiplanar CT image reconstructions of the cervical spine were also generated. RADIATION DOSE REDUCTION: This exam was performed according to the departmental dose-optimization program which includes automated exposure control, adjustment of the mA and/or kV according to patient size and/or use of iterative  reconstruction technique. COMPARISON:  None Available. FINDINGS: CT HEAD FINDINGS BRAIN: BRAIN Cerebral ventricle sizes are concordant with the degree of cerebral volume loss. patchy and confluent areas of decreased attenuation are noted throughout the deep and periventricular white matter of the cerebral hemispheres bilaterally, compatible with chronic microvascular ischemic disease. No evidence of large-territorial acute infarction. No parenchymal hemorrhage. No mass lesion. No extra-axial collection. No mass effect or midline shift. No hydrocephalus. Basilar cisterns are patent. Vascular: No hyperdense vessel. Atherosclerotic calcifications are present within the cavernous internal carotid arteries. Skull: No acute fracture or focal lesion. Sinuses/Orbits: Paranasal sinuses and mastoid air cells are clear. Bilateral lens replacement. Otherwise the orbits are unremarkable. Other: None. CT CERVICAL SPINE FINDINGS Alignment: Grade 1 anterolisthesis of C3 on C4, C4 on C5. Skull base and vertebrae: Multilevel severe degenerative changes of the spine which is most prominent along the C5-C6 level. Associated multilevel moderate severe osseous neural foraminal stenosis. No severe osseous central canal stenosis. No acute fracture. No aggressive appearing focal osseous lesion or focal pathologic process. Soft tissues and spinal canal: No prevertebral fluid or swelling. No visible canal hematoma. Upper chest: Unremarkable. Other: None. IMPRESSION: 1. No acute intracranial abnormality. 2. No acute displaced fracture or traumatic listhesis of the cervical spine. 3. Multilevel severe degenerative changes of the spine which is most prominent along the C5-C6 level. Associated multilevel moderate severe osseous neural foraminal stenosis. Electronically Signed   By: Iven Finn M.D.   On: 07/22/2022 17:11   CT Cervical Spine Wo Contrast  Result Date: 07/22/2022 CLINICAL DATA:  Head trauma, moderate-severe; Neck trauma (Age  >= 65y) EXAM: CT HEAD WITHOUT CONTRAST CT CERVICAL SPINE WITHOUT CONTRAST TECHNIQUE: Multidetector CT imaging of the head and cervical spine was performed following the standard protocol without intravenous contrast. Multiplanar CT image reconstructions of the cervical spine were also generated. RADIATION DOSE REDUCTION: This exam was performed according to the departmental dose-optimization program which includes automated exposure control, adjustment of the mA and/or kV according to patient size and/or use of iterative reconstruction technique. COMPARISON:  None Available. FINDINGS: CT HEAD FINDINGS BRAIN: BRAIN Cerebral ventricle sizes are concordant with the degree of cerebral volume loss. patchy and confluent areas of decreased attenuation are noted throughout the deep and periventricular white matter of the cerebral hemispheres bilaterally, compatible with chronic microvascular ischemic disease. No evidence of large-territorial acute infarction. No parenchymal hemorrhage. No mass lesion. No extra-axial collection. No mass effect or midline shift. No hydrocephalus. Basilar cisterns are patent. Vascular: No hyperdense vessel. Atherosclerotic calcifications are present within the cavernous internal carotid arteries. Skull: No acute fracture or focal lesion. Sinuses/Orbits: Paranasal sinuses and mastoid air cells are clear. Bilateral lens replacement. Otherwise the orbits are unremarkable. Other: None. CT CERVICAL SPINE FINDINGS Alignment: Grade 1 anterolisthesis of C3 on C4, C4 on C5. Skull base and vertebrae: Multilevel severe degenerative changes of the spine which is most  prominent along the C5-C6 level. Associated multilevel moderate severe osseous neural foraminal stenosis. No severe osseous central canal stenosis. No acute fracture. No aggressive appearing focal osseous lesion or focal pathologic process. Soft tissues and spinal canal: No prevertebral fluid or swelling. No visible canal hematoma. Upper  chest: Unremarkable. Other: None. IMPRESSION: 1. No acute intracranial abnormality. 2. No acute displaced fracture or traumatic listhesis of the cervical spine. 3. Multilevel severe degenerative changes of the spine which is most prominent along the C5-C6 level. Associated multilevel moderate severe osseous neural foraminal stenosis. Electronically Signed   By: Iven Finn M.D.   On: 07/22/2022 17:11    Microbiology: Recent Results (from the past 240 hour(s))  Culture, blood (Routine X 2) w Reflex to ID Panel     Status: None   Collection Time: 07/28/22  9:22 PM   Specimen: BLOOD  Result Value Ref Range Status   Specimen Description BLOOD LEFT ANTECUBITAL  Final   Special Requests   Final    BOTTLES DRAWN AEROBIC AND ANAEROBIC Blood Culture adequate volume   Culture   Final    NO GROWTH 5 DAYS Performed at [facility], [address], [place], [place] [address]    Report Status 08/02/2022 FINAL  Final  Culture, blood (Routine X 2) w Reflex to ID Panel     Status: None   Collection Time: 07/28/22  9:22 PM   Specimen: BLOOD  Result Value Ref Range Status   Specimen Description BLOOD BLOOD LEFT WRIST  Final   Special Requests   Final    BOTTLES DRAWN AEROBIC AND ANAEROBIC Blood Culture adequate volume   Culture   Final    NO GROWTH 5 DAYS Performed at [facility], [address], [place], [place] [address]    Report Status 08/02/2022 FINAL  Final     Labs: Basic Metabolic Panel: Recent Labs  Lab 07/30/22 0758 07/31/22 0025 08/01/22 0847 08/02/22 0041 08/04/22 0824  NA 142 137 140 139 139  K 3.7 3.7 3.0* 4.1 3.6  CL 112* 105 106 106 106  CO2 '23 22 24 24 27  '$ GLUCOSE 96 172* 102* 113* 96  BUN '19 15 9 9 '$ <5*  CREATININE 1.14* 0.96 0.95 1.03* 0.91  CALCIUM 8.6* 8.4* 8.5* 8.4* 8.5*  MG  --  1.7 1.5* 1.8 1.8  PHOS  --  4.1 3.6 2.6 3.5   Liver Function Tests: Recent Labs  Lab 07/30/22 0758 07/31/22 0025 08/01/22 0847 08/02/22 0041 08/04/22 0824  AST 23 20 39 25 16   ALT 32 27 37 32 22  ALKPHOS 117 100 86 87 70  BILITOT 1.3* 1.1 0.9 0.7 0.7  PROT 5.6* 5.3* 5.6* 5.4* 5.6*  ALBUMIN 2.3* 2.2* 2.3* 2.3* 2.3*   No results for input(s): "LIPASE", "AMYLASE" in the last 168 hours. No results for input(s): "AMMONIA" in the last 168 hours. CBC: Recent Labs  Lab 07/30/22 0758 07/31/22 0025 08/01/22 0847 08/02/22 0041 08/04/22 0824  WBC 12.5* 11.8* 9.7 11.5* 9.4  NEUTROABS  --  10.1* 6.0 6.9 6.3  HGB 10.9* 10.8* 10.8* 10.7* 10.7*  HCT 33.7* 32.2* 32.7* 32.8* 32.6*  MCV 92.3 90.7 90.8 91.9 92.9  PLT 186 187 200 219 256   Cardiac Enzymes: No results for input(s): "CKTOTAL", "CKMB", "CKMBINDEX", "TROPONINI" in the last 168 hours. BNP: BNP (last 3 results) No results for input(s): "BNP" in the last 8760 hours.  ProBNP (last 3 results) No results for input(s): "PROBNP" in the last 8760 hours.  CBG: Recent Labs  Lab 08/04/22 1347 08/04/22 1743 08/05/22 0014 08/05/22 0550 08/05/22 1146  GLUCAP 123* 110* 136* 102* 132*       Signed:  Fayrene Helper MD.  [facility] 08/05/2022, 3:14 PM

## 2022-08-06 DIAGNOSIS — L8990 Pressure ulcer of unspecified site, unspecified stage: Secondary | ICD-10-CM

## 2022-08-06 DIAGNOSIS — I10 Essential (primary) hypertension: Secondary | ICD-10-CM | POA: Diagnosis not present

## 2022-08-06 DIAGNOSIS — R197 Diarrhea, unspecified: Secondary | ICD-10-CM

## 2022-08-06 DIAGNOSIS — K8050 Calculus of bile duct without cholangitis or cholecystitis without obstruction: Secondary | ICD-10-CM | POA: Diagnosis not present

## 2022-08-06 DIAGNOSIS — N179 Acute kidney failure, unspecified: Secondary | ICD-10-CM | POA: Diagnosis not present

## 2022-08-06 DIAGNOSIS — E1169 Type 2 diabetes mellitus with other specified complication: Secondary | ICD-10-CM

## 2022-08-06 LAB — GLUCOSE, CAPILLARY
Glucose-Capillary: 107 mg/dL — ABNORMAL HIGH (ref 70–99)
Glucose-Capillary: 120 mg/dL — ABNORMAL HIGH (ref 70–99)
Glucose-Capillary: 140 mg/dL — ABNORMAL HIGH (ref 70–99)
Glucose-Capillary: 91 mg/dL (ref 70–99)
Glucose-Capillary: 97 mg/dL (ref 70–99)
Glucose-Capillary: 99 mg/dL (ref 70–99)

## 2022-08-06 NOTE — Discharge Summary (Signed)
Physician Discharge Summary  Glenda Nolan ATF:[id number] DOB: 1935-10-02 DOA: 07/28/2022  PCP: Celene Squibb, MD  Admit date: 07/28/2022 Discharge date: 08/06/2022  Time spent: 40 minutes  Recommendations for Outpatient Follow-up:  Follow outpatient CBC/CMP  Avoid aspirin and NSAIDs x2 weeks after ERCP  Delirium precautions  Discharge Diagnoses:  Active Problems:   Choledocholithiasis   Diarrhea   AKI (acute kidney injury) ([place])   Essential hypertension   Dementia (HCC)   Diabetes ([place])   Hypokalemia   Pressure injury of skin   Discharge Condition: stable  Diet recommendation: heart healthy  Filed Weights   07/28/22 1143  Weight: 65.8 kg    History of present illness:  87 year old female with history of dementia, hypertension, diabetes, brought to the hospital with worsening weakness and diarrhea.  Imaging of her abdomen indicated choledocholithiasis with biliary ductal dilatation.  LFTs were noted to be elevated.  She had AKI with elevated creatinine.  Started on IV fluids and intravenous antibiotics.  [facility] was transferred [facility] and underwent ERCP by GI.  Now s/p 7 days abx.  Surgery was c/w who thought risks of surgery outweighed the benefit.  Course complicated by delirium.  Required sitter for a few days, needs to be without sitter x24 hrs prior to discharge.  Medically stable for discharge.  Hospital Course:  Assessment and Plan: Choledocholithiasis  Cholangitis  CT abd/pelvis with choledocholithiasis resulting in moderate extrahepatic biliary dilatation, minimal cholelithiasis RUQ [place] with cholelithiasis without cholecystitis Intra and extrahepatic biliary ductal dilatation concerning for choledocholithiasis  LFT's overall improved WBC 28 on presentation, resolved Completed antibiotics Appreciate GI, s/p ERCP with biliary sphicterotomy and balloon extraction, distal esophageal stricture, periampullatory diverticulum.  Recommending avoiding aspirin, NSAIDs x2 weeks, abx  x7 days for cholangitis.   General surgery c/s, risks cholecystectomy thought to outweigh benefit -continue medical management   Diarrhea Resolved   AKI (acute kidney injury) ([place]) Resolved  Hypokalemia  Hypomagnesemia Improved with p.o. intake   Essential hypertension Stable. Resume home meds   Dementia (HCC) Donepezil Delirium precautions ongoing, no sitter over the past 24 hours   Diabetes ([place]) Hgba1c 6.8 Resume metformin at discharge   MASD  Intertrigo Likely related to diarrhea Will continue to monitor  Wound c/s  S/p fluconazole     Procedures: ERCP - The entire biliary tree was dilated, with stones causing an obstruction. - Choledocholithiasis and sludge was found. Complete removal was accomplished by biliary sphincterotomy and balloon extraction. - Periampullary diverticulum. - Distal esophageal stricture. Impression: - Avoid aspirin and nonsteroidal anti-inflammatory medicines for 2 weeks. - Return patient to hospital ward for ongoing care. - Observe patient's clinical course following today's ERCP with therapeutic intervention. - Continue antibiotics for a least 7 days for possible cholangitis. - Outpatient GI follow up with Dr. Gala Romney.   Consultations: GI surgery  Discharge Exam: Vitals:   08/05/22 1952 08/06/22 0440  BP: 133/64 (!) 155/76  Pulse: 73 70  Resp: 16 16  Temp: 98.4 F (36.9 C) 97.6 F (36.4 C)  SpO2: 100% 96%   No complaints Daughter at bedside  General: No acute distress.. Sitting up, eating lunch. Lungs: unlabored Neurological: Alert. Moves all extremities 4 with equal strength. Cranial nerves II through XII grossly intact. Extremities: No clubbing or cyanosis. No edema.   Discharge Instructions   Discharge Instructions     Call MD for:  difficulty breathing, headache or visual disturbances   Complete by: As directed    Call MD for:  extreme fatigue  Complete by: As directed    Call MD for:  hives   Complete  by: As directed    Call MD for:  persistant dizziness or light-headedness   Complete by: As directed    Call MD for:  persistant nausea and vomiting   Complete by: As directed    Call MD for:  redness, tenderness, or signs of infection (pain, swelling, redness, odor or green/yellow discharge around incision site)   Complete by: As directed    Call MD for:  severe uncontrolled pain   Complete by: As directed    Call MD for:  temperature >100.4   Complete by: As directed    Diet - low sodium heart healthy   Complete by: As directed    Discharge instructions   Complete by: As directed    You were seen for complications from gallstones.  You've had treatment with an ERCP.  The surgeons discussed the possibility of a cholecystectomy (removal of your gallbladder), but we've decided to defer surgery.   You were treated with 7 days of antibiotics.  We'll send you to a skilled nursing facility for short term rehab.  Return for new, recurrent, or worsening symptoms.  Please ask your PCP to request records from this hospitalization so they know what was done and what the next steps will be.   Discharge wound care:   Complete by: As directed    Frequent turns, foam dressing   Increase activity slowly   Complete by: As directed       Allergies as of 08/06/2022   No Known Allergies      Medication List     TAKE these medications    acetaminophen 325 MG tablet Commonly known as: TYLENOL Take 325 mg by mouth daily after lunch.   alendronate 70 MG tablet Commonly known as: FOSAMAX Take 70 mg by mouth every Monday.   aspirin EC 325 MG tablet Take 1 tablet (325 mg total) by mouth daily with breakfast.   cholecalciferol 25 MCG (1000 UNIT) tablet Commonly known as: VITAMIN D3 Take 1,000 Units by mouth daily after lunch.   docusate sodium 100 MG capsule Commonly known as: COLACE Take 1 capsule (100 mg total) by mouth 2 (two) times daily.   donepezil 5 MG tablet Commonly  known as: ARICEPT Take 10 mg by mouth at bedtime.   furosemide 20 MG tablet Commonly known as: LASIX Take 40 mg by mouth daily.   levothyroxine 75 MCG tablet Commonly known as: SYNTHROID Take 75 mcg by mouth every morning.   metFORMIN 500 MG tablet Commonly known as: GLUCOPHAGE Take 1 tablet by mouth daily.   metoprolol tartrate 100 MG tablet Commonly known as: LOPRESSOR TAKE (1) TABLET BY MOUTHDAT BREAKFAST THEN TAKE (1) TABLET BY MOUTH AT BEDTIME FOR HEART RATE.   omeprazole 20 MG capsule Commonly known as: PRILOSEC Take 20 mg by mouth daily after breakfast. (0800)   potassium chloride 10 MEQ CR capsule Commonly known as: MICRO-K Take 10 mEq by mouth daily.   PX SENIOR VITAMIN PO Take 1 tablet by mouth daily after lunch.   raloxifene 60 MG tablet Commonly known as: EVISTA Take 60 mg by mouth daily after breakfast. (0800)   simvastatin 40 MG tablet Commonly known as: ZOCOR Take 40 mg by mouth at bedtime.   telmisartan 80 MG tablet Commonly known as: MICARDIS Take 80 mg by mouth daily.               Discharge Care  Instructions  (From admission, onward)           Start     Ordered   08/05/22 0000  Discharge wound care:       Comments: Frequent turns, foam dressing   08/05/22 1511           No Known Allergies  Contact information for follow-up providers     Celene Squibb, MD Follow up.   Specialty: Internal Medicine Contact information: [place] [address] [phone]              Contact information for after-discharge care     [facility] .   Service: Skilled Nursing Contact information: [address] [place] [phone]                      The results of significant diagnostics from this hospitalization (including imaging, microbiology, ancillary and laboratory) are listed below for reference.    Significant  Diagnostic Studies: DG ERCP  Result Date: 07/30/2022 CLINICAL DATA:  ERCP for choledocholithiasis. EXAM: ERCP TECHNIQUE: Multiple spot images obtained with the fluoroscopic device and submitted for interpretation post-procedure. FLUOROSCOPY TIME: FLUOROSCOPY TIME 3 minutes, 6 seconds (26.7 mGy) COMPARISON:  CT abdomen and pelvis-07/28/2022 FINDINGS: 16 spot intraoperative fluoroscopic images the right upper abdominal quadrant during ERCP are provided for review Initial image demonstrates ERCP probe overlying the right upper abdominal quadrant. Multiple surgical clips overlie the right mid hemiabdomen Subsequent images demonstrate selective cannulation and opacification of the common bile duct which appears moderate to markedly dilated. There are 2 discrete filling defects within distal aspect of the CBD compatible with provided history of choledocholithiasis. Subsequent images demonstrate insufflation of a balloon within distal aspect of the CBD with subsequent biliary sweeping and presumed stone extraction and sphincterotomy. IMPRESSION: ERCP with findings of choledocholithiasis with subsequent biliary sweeping and presumed stone extraction and sphincterotomy as above. These images were submitted for radiologic interpretation only. Please see the procedural report for the amount of contrast and the fluoroscopy time utilized. Electronically Signed   By: Sandi Mariscal M.D.   On: 07/30/2022 16:08   US Abdomen Limited RUQ (LIVER/GB)  Result Date: 07/29/2022 CLINICAL DATA:  Right upper quadrant pain for 2 weeks EXAM: ULTRASOUND ABDOMEN LIMITED RIGHT UPPER QUADRANT COMPARISON:  CT abdomen/pelvis 1 day prior FINDINGS: Gallbladder: Shadowing gallstones are seen measuring up to 1.0 cm. There is no gallbladder wall thickening. Sonographic Murphy's sign was reported negative. Common bile duct: Diameter: 14 mm. There is mild intrahepatic biliary ductal dilatation Liver: No focal lesion identified. Within normal limits in  parenchymal echogenicity. Portal vein is patent on color Doppler imaging with normal direction of blood flow towards the liver. Other: None. IMPRESSION: 1. Cholelithiasis without evidence of acute cholecystitis. 2. Intra and extrahepatic biliary ductal dilatation suspicious for choledocholithiasis. Electronically Signed   By: Valetta Mole M.D.   On: 07/29/2022 08:26   CT Abdomen Pelvis Wo Contrast  Result Date: 07/28/2022 CLINICAL DATA:  Acute generalized abdominal pain. EXAM: CT ABDOMEN AND PELVIS WITHOUT CONTRAST TECHNIQUE: Multidetector CT imaging of the abdomen and pelvis was performed following the standard protocol without IV contrast. RADIATION DOSE REDUCTION: This exam was performed according to the departmental dose-optimization program which includes automated exposure control, adjustment of the mA and/or kV according to patient size and/or use of iterative reconstruction technique. COMPARISON:  July 12, 2022. FINDINGS: Lower chest: No acute abnormality.  Hepatobiliary: Liver is unremarkable on these unenhanced images. Minimal cholelithiasis is noted. There appears to be multiple stones in the distal common bile duct resulting in moderate extrahepatic biliary dilatation. Pancreas: Unremarkable. No pancreatic ductal dilatation or surrounding inflammatory changes. Spleen: Normal in size without focal abnormality. Adrenals/Urinary Tract: Adrenal glands are unremarkable. Kidneys are normal, without renal calculi, focal lesion, or hydronephrosis. Bladder is unremarkable. Stomach/Bowel: The stomach is unremarkable. Status post right colectomy. There is no abnormal bowel dilatation or inflammation. Sigmoid diverticulosis is noted without inflammation. Vascular/Lymphatic: Aortic atherosclerosis. No enlarged abdominal or pelvic lymph nodes. Reproductive: Uterus and bilateral adnexa are unremarkable. Other: No abdominal wall hernia or abnormality. No abdominopelvic ascites. Musculoskeletal: No acute or  significant osseous findings. IMPRESSION: Choledocholithiasis is noted resulting in moderate extrahepatic biliary dilatation. Minimal cholelithiasis is noted. Status post right colectomy. Sigmoid diverticulosis without inflammation. Aortic Atherosclerosis (ICD10-I70.0). Electronically Signed   By: Marijo Conception M.D.   On: 07/28/2022 17:15   DG Knee Complete 4 Views Right  Result Date: 07/22/2022 CLINICAL DATA:  Found down, fell EXAM: RIGHT KNEE - COMPLETE 4+ VIEW; LEFT KNEE - COMPLETE 4+ VIEW COMPARISON:  12/11/2008 FINDINGS: Right knee: Frontal, bilateral oblique, lateral views are obtained. No acute fracture, subluxation, or dislocation. There is severe 3 compartmental osteoarthritis, greatest in the medial and lateral compartments. Small suprapatellar joint effusion is likely reactive. Soft tissues are otherwise unremarkable. Left knee: Frontal, bilateral oblique, lateral views are obtained. Three component left knee arthroplasty is identified with no evidence of acute complication. No acute displaced fracture, subluxation, or dislocation. No joint effusion. Soft tissues are unremarkable. IMPRESSION: 1. No acute fracture within either knee. 2. Severe 3 compartmental right knee osteoarthritis with small reactive joint effusion. 3. Unremarkable left knee arthroplasty. Electronically Signed   By: Randa Ngo M.D.   On: 07/22/2022 17:22   DG Knee Complete 4 Views Left  Result Date: 07/22/2022 CLINICAL DATA:  Found down, fell EXAM: RIGHT KNEE - COMPLETE 4+ VIEW; LEFT KNEE - COMPLETE 4+ VIEW COMPARISON:  12/11/2008 FINDINGS: Right knee: Frontal, bilateral oblique, lateral views are obtained. No acute fracture, subluxation, or dislocation. There is severe 3 compartmental osteoarthritis, greatest in the medial and lateral compartments. Small suprapatellar joint effusion is likely reactive. Soft tissues are otherwise unremarkable. Left knee: Frontal, bilateral oblique, lateral views are obtained. Three  component left knee arthroplasty is identified with no evidence of acute complication. No acute displaced fracture, subluxation, or dislocation. No joint effusion. Soft tissues are unremarkable. IMPRESSION: 1. No acute fracture within either knee. 2. Severe 3 compartmental right knee osteoarthritis with small reactive joint effusion. 3. Unremarkable left knee arthroplasty. Electronically Signed   By: Randa Ngo M.D.   On: 07/22/2022 17:22   DG Pelvis 1-2 Views  Result Date: 07/22/2022 CLINICAL DATA:  [place], found down EXAM: PELVIS - 1-2 VIEW COMPARISON:  None Available. FINDINGS: Single frontal view of the pelvis includes both hips. No evidence of acute fracture, subluxation, or dislocation. Bilateral hip osteoarthritis, left greater than right. Sacroiliac joints are unremarkable. Moderate degenerative changes at the lumbosacral junction. IMPRESSION: 1. No acute displaced fracture. 2. Bilateral hip osteoarthritis and lower lumbar degenerative changes as above. Electronically Signed   By: Randa Ngo M.D.   On: 07/22/2022 17:20   CT Head Wo Contrast  Result Date: 07/22/2022 CLINICAL DATA:  Head trauma, moderate-severe; Neck trauma (Age >= 65y) EXAM: CT HEAD WITHOUT CONTRAST CT CERVICAL SPINE WITHOUT CONTRAST TECHNIQUE: Multidetector CT imaging of the head and cervical spine was performed  following the standard protocol without intravenous contrast. Multiplanar CT image reconstructions of the cervical spine were also generated. RADIATION DOSE REDUCTION: This exam was performed according to the departmental dose-optimization program which includes automated exposure control, adjustment of the mA and/or kV according to patient size and/or use of iterative reconstruction technique. COMPARISON:  None Available. FINDINGS: CT HEAD FINDINGS BRAIN: BRAIN Cerebral ventricle sizes are concordant with the degree of cerebral volume loss. patchy and confluent areas of decreased attenuation are noted throughout the  deep and periventricular white matter of the cerebral hemispheres bilaterally, compatible with chronic microvascular ischemic disease. No evidence of large-territorial acute infarction. No parenchymal hemorrhage. No mass lesion. No extra-axial collection. No mass effect or midline shift. No hydrocephalus. Basilar cisterns are patent. Vascular: No hyperdense vessel. Atherosclerotic calcifications are present within the cavernous internal carotid arteries. Skull: No acute fracture or focal lesion. Sinuses/Orbits: Paranasal sinuses and mastoid air cells are clear. Bilateral lens replacement. Otherwise the orbits are unremarkable. Other: None. CT CERVICAL SPINE FINDINGS Alignment: Grade 1 anterolisthesis of C3 on C4, C4 on C5. Skull base and vertebrae: Multilevel severe degenerative changes of the spine which is most prominent along the C5-C6 level. Associated multilevel moderate severe osseous neural foraminal stenosis. No severe osseous central canal stenosis. No acute fracture. No aggressive appearing focal osseous lesion or focal pathologic process. Soft tissues and spinal canal: No prevertebral fluid or swelling. No visible canal hematoma. Upper chest: Unremarkable. Other: None. IMPRESSION: 1. No acute intracranial abnormality. 2. No acute displaced fracture or traumatic listhesis of the cervical spine. 3. Multilevel severe degenerative changes of the spine which is most prominent along the C5-C6 level. Associated multilevel moderate severe osseous neural foraminal stenosis. Electronically Signed   By: Iven Finn M.D.   On: 07/22/2022 17:11   CT Cervical Spine Wo Contrast  Result Date: 07/22/2022 CLINICAL DATA:  Head trauma, moderate-severe; Neck trauma (Age >= 65y) EXAM: CT HEAD WITHOUT CONTRAST CT CERVICAL SPINE WITHOUT CONTRAST TECHNIQUE: Multidetector CT imaging of the head and cervical spine was performed following the standard protocol without intravenous contrast. Multiplanar CT image  reconstructions of the cervical spine were also generated. RADIATION DOSE REDUCTION: This exam was performed according to the departmental dose-optimization program which includes automated exposure control, adjustment of the mA and/or kV according to patient size and/or use of iterative reconstruction technique. COMPARISON:  None Available. FINDINGS: CT HEAD FINDINGS BRAIN: BRAIN Cerebral ventricle sizes are concordant with the degree of cerebral volume loss. patchy and confluent areas of decreased attenuation are noted throughout the deep and periventricular white matter of the cerebral hemispheres bilaterally, compatible with chronic microvascular ischemic disease. No evidence of large-territorial acute infarction. No parenchymal hemorrhage. No mass lesion. No extra-axial collection. No mass effect or midline shift. No hydrocephalus. Basilar cisterns are patent. Vascular: No hyperdense vessel. Atherosclerotic calcifications are present within the cavernous internal carotid arteries. Skull: No acute fracture or focal lesion. Sinuses/Orbits: Paranasal sinuses and mastoid air cells are clear. Bilateral lens replacement. Otherwise the orbits are unremarkable. Other: None. CT CERVICAL SPINE FINDINGS Alignment: Grade 1 anterolisthesis of C3 on C4, C4 on C5. Skull base and vertebrae: Multilevel severe degenerative changes of the spine which is most prominent along the C5-C6 level. Associated multilevel moderate severe osseous neural foraminal stenosis. No severe osseous central canal stenosis. No acute fracture. No aggressive appearing focal osseous lesion or focal pathologic process. Soft tissues and spinal canal: No prevertebral fluid or swelling. No visible canal hematoma. Upper chest: Unremarkable. Other: None. IMPRESSION: 1.  No acute intracranial abnormality. 2. No acute displaced fracture or traumatic listhesis of the cervical spine. 3. Multilevel severe degenerative changes of the spine which is most prominent  along the C5-C6 level. Associated multilevel moderate severe osseous neural foraminal stenosis. Electronically Signed   By: Iven Finn M.D.   On: 07/22/2022 17:11    Microbiology: Recent Results (from the past 240 hour(s))  Culture, blood (Routine X 2) w Reflex to ID Panel     Status: None   Collection Time: 07/28/22  9:22 PM   Specimen: BLOOD  Result Value Ref Range Status   Specimen Description BLOOD LEFT ANTECUBITAL  Final   Special Requests   Final    BOTTLES DRAWN AEROBIC AND ANAEROBIC Blood Culture adequate volume   Culture   Final    NO GROWTH 5 DAYS Performed at [facility], [address], [place], [place] [address]    Report Status 08/02/2022 FINAL  Final  Culture, blood (Routine X 2) w Reflex to ID Panel     Status: None   Collection Time: 07/28/22  9:22 PM   Specimen: BLOOD  Result Value Ref Range Status   Specimen Description BLOOD BLOOD LEFT WRIST  Final   Special Requests   Final    BOTTLES DRAWN AEROBIC AND ANAEROBIC Blood Culture adequate volume   Culture   Final    NO GROWTH 5 DAYS Performed at [facility], [address] [place], [place] [address]    Report Status 08/02/2022 FINAL  Final     Labs: Basic Metabolic Panel: Recent Labs  Lab 07/30/22 0758 07/31/22 0025 08/01/22 0847 08/02/22 0041 08/04/22 0824  NA 142 137 140 139 139  K 3.7 3.7 3.0* 4.1 3.6  CL 112* 105 106 106 106  CO2 '23 22 24 24 27  '$ GLUCOSE 96 172* 102* 113* 96  BUN '19 15 9 9 '$ <5*  CREATININE 1.14* 0.96 0.95 1.03* 0.91  CALCIUM 8.6* 8.4* 8.5* 8.4* 8.5*  MG  --  1.7 1.5* 1.8 1.8  PHOS  --  4.1 3.6 2.6 3.5    Liver Function Tests: Recent Labs  Lab 07/30/22 0758 07/31/22 0025 08/01/22 0847 08/02/22 0041 08/04/22 0824  AST 23 20 39 25 16  ALT 32 27 37 32 22  ALKPHOS 117 100 86 87 70  BILITOT 1.3* 1.1 0.9 0.7 0.7  PROT 5.6* 5.3* 5.6* 5.4* 5.6*  ALBUMIN 2.3* 2.2* 2.3* 2.3* 2.3*    No results for input(s): "LIPASE", "AMYLASE" in the last 168 hours. No results for  input(s): "AMMONIA" in the last 168 hours. CBC: Recent Labs  Lab 07/30/22 0758 07/31/22 0025 08/01/22 0847 08/02/22 0041 08/04/22 0824  WBC 12.5* 11.8* 9.7 11.5* 9.4  NEUTROABS  --  10.1* 6.0 6.9 6.3  HGB 10.9* 10.8* 10.8* 10.7* 10.7*  HCT 33.7* 32.2* 32.7* 32.8* 32.6*  MCV 92.3 90.7 90.8 91.9 92.9  PLT 186 187 200 219 256    Cardiac Enzymes: No results for input(s): "CKTOTAL", "CKMB", "CKMBINDEX", "TROPONINI" in the last 168 hours. BNP: BNP (last 3 results) No results for input(s): "BNP" in the last 8760 hours.  ProBNP (last 3 results) No results for input(s): "PROBNP" in the last 8760 hours.  CBG: Recent Labs  Lab 08/05/22 0550 08/05/22 1146 08/05/22 1752 08/06/22 0028 08/06/22 0440  GLUCAP 102* 132* 184* 107* 99        Signed:  Little Ishikawa DO  [facility] 08/06/2022, 7:26 AM

## 2022-08-06 NOTE — Care Management Important Message (Signed)
Important Message  Patient Details  Name: Glenda Nolan MRN: [id number] Date of Birth: [birth year]   Medicare Important Message Given:  Yes     [name] 08/06/2022, 2:18 PM

## 2022-08-06 NOTE — Progress Notes (Signed)
Mobility Specialist - Progress Note   08/06/22 1138  Mobility  Activity Ambulated with assistance in hallway  Level of Assistance Contact guard assist, steadying assist  Assistive Device Front wheel walker  Distance Ambulated (ft) 180 ft  Activity Response Tolerated well  $Mobility charge 1 Mobility    Pt received in bed agreeable to mobility. No complaints throughout. Left in recliner w/ call bell in reach and all needs met.   Paulla Dolly Mobility Specialist

## 2022-08-06 NOTE — Progress Notes (Signed)
Occupational Therapy Treatment Patient Details Name: Glenda Nolan MRN: [id number] DOB: July 10, 1935 Today's Date: 08/06/2022   History of present illness 87 year old female admitted for choledocholithiasis with biliary ductal dilatation and AKI. ERCP, sphincterotomy and stone removal on 9/6. PMH: dementia, anemia, hx of colon CA, diverticulosis.   OT comments  Patient progressing and showed improved ability to perform full sponge bathing in sitting and standing positions as well as UE dressing with Min As and LE dressing with Min-Mod As both in sitting and standing, compared to previous session. Pt did have small bowel incontinence when standing and was unaware of this, and did require total assist for peri care due to being blocked by mepilex to buttocks, and pt did expressed tenderness and pain to peri areas which appeared red/raw. Patient remains limited by bowel incontinence with peri area pain, baseline dementia, generalized weakness and decreased activity tolerance along with deficits noted below. Pt continues to demonstrate good rehab potential and would benefit from continued skilled OT to increase safety and independence with ADLs and functional transfers to allow pt to return home safely and reduce caregiver burden and fall risk.    Recommendations for follow up therapy are one component of a multi-disciplinary discharge planning process, led by the attending physician.  Recommendations may be updated based on patient status, additional functional criteria and insurance authorization.    Follow Up Recommendations  Skilled nursing-short term rehab (<3 hours/day)    Assistance Recommended at Discharge Frequent or constant Supervision/Assistance  Patient can return home with the following  A lot of help with walking and/or transfers;A lot of help with bathing/dressing/bathroom   Equipment Recommendations  BSC/3in1    Recommendations for Other Services      Precautions / Restrictions  Precautions Precautions: Fall Restrictions Weight Bearing Restrictions: No       Mobility Bed Mobility                    Transfers                         Balance           Standing balance support: No upper extremity supported, During functional activity Standing balance-Leahy Scale: Fair Standing balance comment: Fair for moments of LE dressing and bathing when pt releasing UEs from walker for functional task without LOB.  Poor for dynamic standing.                           ADL either performed or assessed with clinical judgement   ADL Overall ADL's : Needs assistance/impaired     Grooming: Supervision/safety;Sitting;Set up;Wash/dry face;Wash/dry hands   Upper Body Bathing: Cueing for sequencing;Set up;Supervision/ safety;Sitting   Lower Body Bathing: Minimal assistance;Sitting/lateral leans;Sit to/from stand;Cueing for sequencing Lower Body Bathing Details (indicate cue type and reason): Pt able to reach feet and LEs to bathe while sitting edge of recliner.  Once standing pt able to wash anterior peri areas but provided with assistance on posterior peri area due to mepilex covering much of buttocks. Upper Body Dressing : Cueing for sequencing;Set up;Minimal assistance Upper Body Dressing Details (indicate cue type and reason): Pt donned bra with assist for clasps and overhead shirt with setup/supervision and Min As to bring shirt down in back. Lower Body Dressing: Minimal assistance;Moderate assistance;Sit to/from stand;Sitting/lateral leans Lower Body Dressing Details (indicate cue type and reason): Pt able to doff socks with supervision. Pt donned  socks with Moderate assist.  Pt donned underwear and pants over feet with setup/supervision after cues to don both before standing to conserve energy. Pt stood and donned underwear and pants with Min guard for standing and Min As to fully bring pants up in back. Toilet Transfer: Public house manager Details (indicate cue type and reason): Pt stood from recliner for bathing x 3 with Min guard assistance. Pt pivoting with RW as needed during LE bathing. Toileting- Clothing Manipulation and Hygiene: Total assistance;Sit to/from stand Toileting - Clothing Manipulation Details (indicate cue type and reason): Pt found to be soiled during bathing. Pt denied having BM. Pt asissted with cleaning but very tender and red skin in posterior peri area so had to dab gently. Pt stood for hygiene with BUE support on RW.     Functional mobility during ADLs: Min guard;Rolling walker (2 wheels);Cueing for safety;Cueing for sequencing      Extremity/Trunk Assessment Upper Extremity Assessment Upper Extremity Assessment: Generalized weakness   Lower Extremity Assessment Lower Extremity Assessment: Defer to PT evaluation   Cervical / Trunk Assessment Cervical / Trunk Assessment: Kyphotic    Vision Baseline Vision/History: 1 Wears glasses Ability to See in Adequate Light: 0 Adequate Patient Visual Report: No change from baseline     Perception     Praxis      Cognition Arousal/Alertness: Awake/alert Behavior During Therapy: WFL for tasks assessed/performed, Flat affect Overall Cognitive Status: History of cognitive impairments - at baseline                     Current Attention Level: Sustained Memory: Decreased short-term memory Following Commands: Follows one step commands with increased time Safety/Judgement: Decreased awareness of safety, Decreased awareness of deficits Awareness: Intellectual Problem Solving: Slow processing, Decreased initiation, Difficulty sequencing, Requires verbal cues, Requires tactile cues General Comments: hx of dementia at baseline. Daughter present and gives pt clear 1-step commands and allows pt time to process.        Exercises      Shoulder Instructions       General Comments 62 HR and 98% SpO2 on RA     Pertinent Vitals/ Pain       Pain Assessment Pain Assessment: Faces Faces Pain Scale: Hurts little more Pain Location: Posterior peri areas during hygiene. Pain Descriptors / Indicators: Grimacing, Moaning Pain Intervention(s): Limited activity within patient's tolerance, Monitored during session  Home Living                                          Prior Functioning/Environment              Frequency  Min 2X/week        Progress Toward Goals  OT Goals(current goals can now be found in the care plan section)  Progress towards OT goals: Progressing toward goals  Acute Rehab OT Goals Patient Stated Goal: Pt and daughter anticipate d/c to rehab facility today. OT Goal Formulation: With patient/family Time For Goal Achievement: 08/14/22 Potential to Achieve Goals: Good  Plan Discharge plan remains appropriate    Co-evaluation                 AM-PAC OT "6 Clicks" Daily Activity     Outcome Measure   Help from another person eating meals?: A Little Help from another person taking care of personal grooming?: A Little Help  from another person toileting, which includes using toliet, bedpan, or urinal?: A Lot Help from another person bathing (including washing, rinsing, drying)?: A Little Help from another person to put on and taking off regular upper body clothing?: A Little Help from another person to put on and taking off regular lower body clothing?: A Lot 6 Click Score: 16    End of Session Equipment Utilized During Treatment: Rolling walker (2 wheels)  OT Visit Diagnosis: Unsteadiness on feet (R26.81);Other abnormalities of gait and mobility (R26.89);Muscle weakness (generalized) (M62.81)   Activity Tolerance Patient tolerated treatment well   Patient Left in chair;with call bell/phone within reach;with chair alarm set;with family/visitor present   Nurse Communication          Time: 0352-4818 OT Time Calculation (min): 20  min  Charges: OT General Charges $OT Visit: 1 Visit OT Treatments $Self Care/Home Management : 8-22 mins  Anderson Malta, [place] Office: [phone] 08/06/2022  Julien Girt 08/06/2022, 2:00 PM

## 2022-08-06 NOTE — Plan of Care (Signed)
  Problem: Coping: Goal: Ability to adjust to condition or change in health will improve Outcome: Progressing   Problem: Fluid Volume: Goal: Ability to maintain a balanced intake and output will improve Outcome: Progressing   Problem: Health Behavior/Discharge Planning: Goal: Ability to identify and utilize available resources and services will improve Outcome: Not Progressing Goal: Ability to manage health-related needs will improve Outcome: Not Progressing

## 2022-08-06 NOTE — Progress Notes (Addendum)
Physical Therapy Treatment Patient Details Name: Glenda Nolan MRN: [id number] DOB: 27-Jun-1935 Today's Date: 08/06/2022   History of Present Illness 87 year old female admitted for choledocholithiasis with biliary ductal dilatation and AKI. ERCP, sphincterotomy and stone removal on 9/6. PMH: dementia, anemia, hx of colon CA, diverticulosis.    PT Comments    Pt ambulated with mobility specialist just prior to session. Pt agreeable to therex. Pt required max verbal, demonstration, and tactile cues (including rep of PROM when performing incorrectly). Pt with poor motor planning and decreased motivation although some fatigue present.    Recommendations for follow up therapy are one component of a multi-disciplinary discharge planning process, led by the attending physician.  Recommendations may be updated based on patient status, additional functional criteria and insurance authorization.  Follow Up Recommendations  Skilled nursing-short term rehab (<3 hours/day) Can patient physically be transported by private vehicle: Yes   Assistance Recommended at Discharge Frequent or constant Supervision/Assistance  Patient can return home with the following Assistance with cooking/housework;Assist for transportation;Help with stairs or ramp for entrance;Direct supervision/assist for medications management;Direct supervision/assist for financial management;A little help with walking and/or transfers;A little help with bathing/dressing/bathroom   Equipment Recommendations  None recommended by PT    Recommendations for Other Services       Precautions / Restrictions Precautions Precautions: Fall Restrictions Weight Bearing Restrictions: No     Mobility  Bed Mobility               General bed mobility comments: Pt OOB in chair upon arrival.    Transfers Overall transfer level: Needs assistance Equipment used: Rolling walker (2 wheels) Transfers: Sit to/from Stand Sit to Stand: Min  guard           General transfer comment: Min guard for safety. Performed sit to stand for calf raises.    Ambulation/Gait               General Gait Details: Pt ambulated 180 feet with mobility specialist just prior to session and thus deferred.   Stairs             Wheelchair Mobility    Modified Rankin (Stroke Patients Only)       Balance       Sitting balance - Comments: not assessed 2/2 being up in chair   Standing balance support: Bilateral upper extremity supported Standing balance-Leahy Scale: Poor Standing balance comment: relies on RW and external support                            Cognition Arousal/Alertness: Awake/alert Behavior During Therapy: WFL for tasks assessed/performed, Flat affect Overall Cognitive Status: History of cognitive impairments - at baseline Area of Impairment: Attention, Orientation, Memory, Following commands, Safety/judgement, Awareness, Problem solving                 Orientation Level: Disoriented to, Time, Situation Current Attention Level: Sustained Memory: Decreased short-term memory Following Commands: Follows one step commands with increased time Safety/Judgement: Decreased awareness of safety, Decreased awareness of deficits Awareness: Intellectual Problem Solving: Slow processing, Decreased initiation, Difficulty sequencing, Requires verbal cues, Requires tactile cues General Comments: hx of dementia at baseline, unable to orient to place. slow processing and difficulty sequencing for motor tasks        Exercises General Exercises - Lower Extremity Hip ABduction/ADduction: Right, Left, 10 reps, Supine Straight Leg Raises: Other (comment) (unable on either side when attempted 2/2 weakness and motor  planning deficits) Hip Flexion/Marching: Right, Left, 10 reps, Seated, Other (comment) (D1 hip flexion) Heel Raises: Both, Standing, Other (comment) (with RW x 8 reps)    General Comments  General comments (skin integrity, edema, etc.): 62 HR and 98% SpO2 on RA. Pt's daughter present.      Pertinent Vitals/Pain Pain Assessment Pain Assessment: No/denies pain    Home Living                          Prior Function            PT Goals (current goals can now be found in the care plan section) Acute Rehab PT Goals Patient Stated Goal: to go home PT Goal Formulation: With patient Time For Goal Achievement: 08/14/22 Potential to Achieve Goals: Good Progress towards PT goals: Progressing toward goals    Frequency    Min 2X/week      PT Plan Current plan remains appropriate    Co-evaluation              AM-PAC PT "6 Clicks" Mobility   Outcome Measure  Help needed turning from your back to your side while in a flat bed without using bedrails?: A Little Help needed moving from lying on your back to sitting on the side of a flat bed without using bedrails?: A Lot Help needed moving to and from a bed to a chair (including a wheelchair)?: A Little Help needed standing up from a chair using your arms (e.g., wheelchair or bedside chair)?: A Little Help needed to walk in hospital room?: A Little Help needed climbing 3-5 steps with a railing? : A Lot 6 Click Score: 16    End of Session Equipment Utilized During Treatment: Gait belt Activity Tolerance: Patient limited by fatigue Patient left: in chair;with call bell/phone within reach;with chair alarm set;with family/visitor present Nurse Communication: Mobility status PT Visit Diagnosis: Muscle weakness (generalized) (M62.81)     Time: 9242-6834 PT Time Calculation (min) (ACUTE ONLY): 18 min  Charges:  $Therapeutic Exercise: 8-22 mins                     Donna Bernard, PT    [facility] 08/06/2022, 12:09 PM

## 2022-08-06 NOTE — Progress Notes (Signed)
Patient was picked up by [facility]

## 2022-08-06 NOTE — Progress Notes (Signed)
Call received from DON at [facility] to verify that pt has been off tele sitter. She is aware that pt has not had tele sitter for more than 24 hours and that pt remains redirectable with poor safety awareness.

## 2022-08-06 NOTE — Progress Notes (Signed)
Report called to nurse at [place]. No ETA given by PTAR. Family at pt bedside

## 2022-08-06 NOTE — TOC Transition Note (Signed)
Transition of Care [facility]) - CM/SW Discharge Note   Patient Details  Name: Glenda Nolan MRN: [id number] Date of Birth: October 17, 1935  Transition of Care [facility]) CM/SW Contact:  Milas Gain, [place] Phone Number: 08/06/2022, 12:22 PM   Clinical Narrative:     Patient will DC to: [facility]   Anticipated DC date: 08/06/2022  Family notified: [name]   Transport by: Corey Harold  ?  Per MD patient ready for DC to [facility] . RN, patient, patient's family, and facility notified of DC. Discharge Summary sent to facility. RN given number for report [id number]#[phone] RM#[id number] Bed 1. DC packet on chart. Ambulance transport requested for patient.  CSW signing off.   Final next level of care: [place] Barriers to Discharge: No Barriers Identified   Patient Goals and CMS Choice Patient states their goals for this hospitalization and ongoing recovery are::  (Spoke with daughter [name]) CMS Medicare.gov Compare Post Acute Care list provided to:: Patient Represenative (must comment) (Patients daughter [name]) Choice offered to / list presented to : Adult Children (Patients daughter [name])  Discharge Placement              Patient chooses bed at:  [facility]) Patient to be transferred to facility by: [place] Name of family member notified: [name] Patient and family notified of of transfer: 08/06/22  Discharge Plan and Services   Discharge Planning Services: CM Consult                                 Social Determinants of Health ([place]) Interventions     Readmission Risk Interventions     No data to display

## 2022-08-07 DIAGNOSIS — K8000 Calculus of gallbladder with acute cholecystitis without obstruction: Secondary | ICD-10-CM | POA: Diagnosis not present

## 2022-08-07 DIAGNOSIS — E119 Type 2 diabetes mellitus without complications: Secondary | ICD-10-CM | POA: Diagnosis not present

## 2022-08-07 DIAGNOSIS — I10 Essential (primary) hypertension: Secondary | ICD-10-CM | POA: Diagnosis not present

## 2022-08-07 DIAGNOSIS — I82409 Acute embolism and thrombosis of unspecified deep veins of unspecified lower extremity: Secondary | ICD-10-CM | POA: Diagnosis not present

## 2022-08-07 DIAGNOSIS — N390 Urinary tract infection, site not specified: Secondary | ICD-10-CM | POA: Diagnosis not present

## 2022-08-07 DIAGNOSIS — E039 Hypothyroidism, unspecified: Secondary | ICD-10-CM | POA: Diagnosis not present

## 2022-08-07 DIAGNOSIS — M19032 Primary osteoarthritis, left wrist: Secondary | ICD-10-CM | POA: Diagnosis not present

## 2022-08-07 DIAGNOSIS — M25532 Pain in left wrist: Secondary | ICD-10-CM | POA: Diagnosis not present

## 2022-08-07 DIAGNOSIS — N3090 Cystitis, unspecified without hematuria: Secondary | ICD-10-CM | POA: Diagnosis not present

## 2022-08-07 DIAGNOSIS — C44591 Other specified malignant neoplasm of skin of breast: Secondary | ICD-10-CM | POA: Diagnosis not present

## 2022-08-07 DIAGNOSIS — R2689 Other abnormalities of gait and mobility: Secondary | ICD-10-CM | POA: Diagnosis not present

## 2022-08-07 DIAGNOSIS — M160 Bilateral primary osteoarthritis of hip: Secondary | ICD-10-CM | POA: Diagnosis not present

## 2022-08-07 DIAGNOSIS — E11622 Type 2 diabetes mellitus with other skin ulcer: Secondary | ICD-10-CM | POA: Diagnosis not present

## 2022-08-07 DIAGNOSIS — M810 Age-related osteoporosis without current pathological fracture: Secondary | ICD-10-CM | POA: Diagnosis not present

## 2022-08-07 DIAGNOSIS — M79632 Pain in left forearm: Secondary | ICD-10-CM | POA: Diagnosis not present

## 2022-08-07 DIAGNOSIS — E785 Hyperlipidemia, unspecified: Secondary | ICD-10-CM | POA: Diagnosis not present

## 2022-08-07 DIAGNOSIS — K219 Gastro-esophageal reflux disease without esophagitis: Secondary | ICD-10-CM | POA: Diagnosis not present

## 2022-08-07 DIAGNOSIS — L89313 Pressure ulcer of right buttock, stage 3: Secondary | ICD-10-CM | POA: Diagnosis not present

## 2022-08-07 DIAGNOSIS — M1711 Unilateral primary osteoarthritis, right knee: Secondary | ICD-10-CM | POA: Diagnosis not present

## 2022-08-07 DIAGNOSIS — M1812 Unilateral primary osteoarthritis of first carpometacarpal joint, left hand: Secondary | ICD-10-CM | POA: Diagnosis not present

## 2022-08-07 DIAGNOSIS — M6281 Muscle weakness (generalized): Secondary | ICD-10-CM | POA: Diagnosis not present

## 2022-08-07 DIAGNOSIS — D649 Anemia, unspecified: Secondary | ICD-10-CM | POA: Diagnosis not present

## 2022-08-07 DIAGNOSIS — K8309 Other cholangitis: Secondary | ICD-10-CM | POA: Diagnosis not present

## 2022-08-07 DIAGNOSIS — F03918 Unspecified dementia, unspecified severity, with other behavioral disturbance: Secondary | ICD-10-CM | POA: Diagnosis not present

## 2022-08-07 DIAGNOSIS — K8050 Calculus of bile duct without cholangitis or cholecystitis without obstruction: Secondary | ICD-10-CM | POA: Diagnosis not present

## 2022-08-07 DIAGNOSIS — L89323 Pressure ulcer of left buttock, stage 3: Secondary | ICD-10-CM | POA: Diagnosis not present

## 2022-08-07 DIAGNOSIS — N179 Acute kidney failure, unspecified: Secondary | ICD-10-CM | POA: Diagnosis not present

## 2022-08-07 DIAGNOSIS — M79602 Pain in left arm: Secondary | ICD-10-CM | POA: Diagnosis not present

## 2022-08-07 DIAGNOSIS — E11628 Type 2 diabetes mellitus with other skin complications: Secondary | ICD-10-CM | POA: Diagnosis not present

## 2022-08-08 DIAGNOSIS — L89323 Pressure ulcer of left buttock, stage 3: Secondary | ICD-10-CM | POA: Diagnosis not present

## 2022-08-08 DIAGNOSIS — L89313 Pressure ulcer of right buttock, stage 3: Secondary | ICD-10-CM | POA: Diagnosis not present

## 2022-08-08 DIAGNOSIS — E11622 Type 2 diabetes mellitus with other skin ulcer: Secondary | ICD-10-CM | POA: Diagnosis not present

## 2022-08-11 DIAGNOSIS — E119 Type 2 diabetes mellitus without complications: Secondary | ICD-10-CM | POA: Diagnosis not present

## 2022-08-11 DIAGNOSIS — M6281 Muscle weakness (generalized): Secondary | ICD-10-CM | POA: Diagnosis not present

## 2022-08-11 DIAGNOSIS — M1812 Unilateral primary osteoarthritis of first carpometacarpal joint, left hand: Secondary | ICD-10-CM | POA: Diagnosis not present

## 2022-08-11 DIAGNOSIS — M160 Bilateral primary osteoarthritis of hip: Secondary | ICD-10-CM | POA: Diagnosis not present

## 2022-08-11 DIAGNOSIS — M25532 Pain in left wrist: Secondary | ICD-10-CM | POA: Diagnosis not present

## 2022-08-11 DIAGNOSIS — M79632 Pain in left forearm: Secondary | ICD-10-CM | POA: Diagnosis not present

## 2022-08-11 DIAGNOSIS — E785 Hyperlipidemia, unspecified: Secondary | ICD-10-CM | POA: Diagnosis not present

## 2022-08-11 DIAGNOSIS — M79602 Pain in left arm: Secondary | ICD-10-CM | POA: Diagnosis not present

## 2022-08-11 DIAGNOSIS — N179 Acute kidney failure, unspecified: Secondary | ICD-10-CM | POA: Diagnosis not present

## 2022-08-11 DIAGNOSIS — I10 Essential (primary) hypertension: Secondary | ICD-10-CM | POA: Diagnosis not present

## 2022-08-11 DIAGNOSIS — K219 Gastro-esophageal reflux disease without esophagitis: Secondary | ICD-10-CM | POA: Diagnosis not present

## 2022-08-11 DIAGNOSIS — M19032 Primary osteoarthritis, left wrist: Secondary | ICD-10-CM | POA: Diagnosis not present

## 2022-08-11 DIAGNOSIS — E039 Hypothyroidism, unspecified: Secondary | ICD-10-CM | POA: Diagnosis not present

## 2022-08-12 DIAGNOSIS — E119 Type 2 diabetes mellitus without complications: Secondary | ICD-10-CM | POA: Diagnosis not present

## 2022-08-12 DIAGNOSIS — N179 Acute kidney failure, unspecified: Secondary | ICD-10-CM | POA: Diagnosis not present

## 2022-08-12 DIAGNOSIS — I10 Essential (primary) hypertension: Secondary | ICD-10-CM | POA: Diagnosis not present

## 2022-08-12 DIAGNOSIS — M1711 Unilateral primary osteoarthritis, right knee: Secondary | ICD-10-CM | POA: Diagnosis not present

## 2022-08-12 DIAGNOSIS — M810 Age-related osteoporosis without current pathological fracture: Secondary | ICD-10-CM | POA: Diagnosis not present

## 2022-08-12 DIAGNOSIS — K8309 Other cholangitis: Secondary | ICD-10-CM | POA: Diagnosis not present

## 2022-08-12 DIAGNOSIS — F03918 Unspecified dementia, unspecified severity, with other behavioral disturbance: Secondary | ICD-10-CM | POA: Diagnosis not present

## 2022-08-12 DIAGNOSIS — D649 Anemia, unspecified: Secondary | ICD-10-CM | POA: Diagnosis not present

## 2022-08-12 DIAGNOSIS — K8050 Calculus of bile duct without cholangitis or cholecystitis without obstruction: Secondary | ICD-10-CM | POA: Diagnosis not present

## 2022-08-13 DIAGNOSIS — L89313 Pressure ulcer of right buttock, stage 3: Secondary | ICD-10-CM | POA: Diagnosis not present

## 2022-08-13 DIAGNOSIS — L89323 Pressure ulcer of left buttock, stage 3: Secondary | ICD-10-CM | POA: Diagnosis not present

## 2022-08-13 DIAGNOSIS — E11622 Type 2 diabetes mellitus with other skin ulcer: Secondary | ICD-10-CM | POA: Diagnosis not present

## 2022-08-14 DIAGNOSIS — I10 Essential (primary) hypertension: Secondary | ICD-10-CM | POA: Diagnosis not present

## 2022-08-14 DIAGNOSIS — K219 Gastro-esophageal reflux disease without esophagitis: Secondary | ICD-10-CM | POA: Diagnosis not present

## 2022-08-14 DIAGNOSIS — M6281 Muscle weakness (generalized): Secondary | ICD-10-CM | POA: Diagnosis not present

## 2022-08-14 DIAGNOSIS — E039 Hypothyroidism, unspecified: Secondary | ICD-10-CM | POA: Diagnosis not present

## 2022-08-14 DIAGNOSIS — E785 Hyperlipidemia, unspecified: Secondary | ICD-10-CM | POA: Diagnosis not present

## 2022-08-14 DIAGNOSIS — E119 Type 2 diabetes mellitus without complications: Secondary | ICD-10-CM | POA: Diagnosis not present

## 2022-08-14 DIAGNOSIS — N179 Acute kidney failure, unspecified: Secondary | ICD-10-CM | POA: Diagnosis not present

## 2022-08-14 DIAGNOSIS — M25532 Pain in left wrist: Secondary | ICD-10-CM | POA: Diagnosis not present

## 2022-08-15 DIAGNOSIS — F03918 Unspecified dementia, unspecified severity, with other behavioral disturbance: Secondary | ICD-10-CM | POA: Diagnosis not present

## 2022-08-15 DIAGNOSIS — I10 Essential (primary) hypertension: Secondary | ICD-10-CM | POA: Diagnosis not present

## 2022-08-20 DIAGNOSIS — M25532 Pain in left wrist: Secondary | ICD-10-CM | POA: Diagnosis not present

## 2022-08-20 DIAGNOSIS — E785 Hyperlipidemia, unspecified: Secondary | ICD-10-CM | POA: Diagnosis not present

## 2022-08-20 DIAGNOSIS — L89323 Pressure ulcer of left buttock, stage 3: Secondary | ICD-10-CM | POA: Diagnosis not present

## 2022-08-20 DIAGNOSIS — K219 Gastro-esophageal reflux disease without esophagitis: Secondary | ICD-10-CM | POA: Diagnosis not present

## 2022-08-20 DIAGNOSIS — I10 Essential (primary) hypertension: Secondary | ICD-10-CM | POA: Diagnosis not present

## 2022-08-20 DIAGNOSIS — L89313 Pressure ulcer of right buttock, stage 3: Secondary | ICD-10-CM | POA: Diagnosis not present

## 2022-08-20 DIAGNOSIS — E119 Type 2 diabetes mellitus without complications: Secondary | ICD-10-CM | POA: Diagnosis not present

## 2022-08-20 DIAGNOSIS — N179 Acute kidney failure, unspecified: Secondary | ICD-10-CM | POA: Diagnosis not present

## 2022-08-20 DIAGNOSIS — E039 Hypothyroidism, unspecified: Secondary | ICD-10-CM | POA: Diagnosis not present

## 2022-08-20 DIAGNOSIS — E11622 Type 2 diabetes mellitus with other skin ulcer: Secondary | ICD-10-CM | POA: Diagnosis not present

## 2022-08-20 DIAGNOSIS — M6281 Muscle weakness (generalized): Secondary | ICD-10-CM | POA: Diagnosis not present

## 2022-08-25 DIAGNOSIS — N179 Acute kidney failure, unspecified: Secondary | ICD-10-CM | POA: Diagnosis not present

## 2022-08-25 DIAGNOSIS — I10 Essential (primary) hypertension: Secondary | ICD-10-CM | POA: Diagnosis not present

## 2022-08-25 DIAGNOSIS — E119 Type 2 diabetes mellitus without complications: Secondary | ICD-10-CM | POA: Diagnosis not present

## 2022-08-25 DIAGNOSIS — M6281 Muscle weakness (generalized): Secondary | ICD-10-CM | POA: Diagnosis not present

## 2022-08-25 DIAGNOSIS — E785 Hyperlipidemia, unspecified: Secondary | ICD-10-CM | POA: Diagnosis not present

## 2022-08-25 DIAGNOSIS — E039 Hypothyroidism, unspecified: Secondary | ICD-10-CM | POA: Diagnosis not present

## 2022-08-25 DIAGNOSIS — M25532 Pain in left wrist: Secondary | ICD-10-CM | POA: Diagnosis not present

## 2022-08-25 DIAGNOSIS — K219 Gastro-esophageal reflux disease without esophagitis: Secondary | ICD-10-CM | POA: Diagnosis not present

## 2022-08-27 DIAGNOSIS — M6281 Muscle weakness (generalized): Secondary | ICD-10-CM | POA: Diagnosis not present

## 2022-08-27 DIAGNOSIS — E785 Hyperlipidemia, unspecified: Secondary | ICD-10-CM | POA: Diagnosis not present

## 2022-08-27 DIAGNOSIS — L89313 Pressure ulcer of right buttock, stage 3: Secondary | ICD-10-CM | POA: Diagnosis not present

## 2022-08-27 DIAGNOSIS — I10 Essential (primary) hypertension: Secondary | ICD-10-CM | POA: Diagnosis not present

## 2022-08-27 DIAGNOSIS — E039 Hypothyroidism, unspecified: Secondary | ICD-10-CM | POA: Diagnosis not present

## 2022-08-27 DIAGNOSIS — E119 Type 2 diabetes mellitus without complications: Secondary | ICD-10-CM | POA: Diagnosis not present

## 2022-08-27 DIAGNOSIS — K219 Gastro-esophageal reflux disease without esophagitis: Secondary | ICD-10-CM | POA: Diagnosis not present

## 2022-08-27 DIAGNOSIS — M25532 Pain in left wrist: Secondary | ICD-10-CM | POA: Diagnosis not present

## 2022-08-27 DIAGNOSIS — E11622 Type 2 diabetes mellitus with other skin ulcer: Secondary | ICD-10-CM | POA: Diagnosis not present

## 2022-08-27 DIAGNOSIS — L89323 Pressure ulcer of left buttock, stage 3: Secondary | ICD-10-CM | POA: Diagnosis not present

## 2022-08-27 DIAGNOSIS — N179 Acute kidney failure, unspecified: Secondary | ICD-10-CM | POA: Diagnosis not present

## 2022-08-29 DIAGNOSIS — M25532 Pain in left wrist: Secondary | ICD-10-CM | POA: Diagnosis not present

## 2022-08-29 DIAGNOSIS — E119 Type 2 diabetes mellitus without complications: Secondary | ICD-10-CM | POA: Diagnosis not present

## 2022-08-29 DIAGNOSIS — E785 Hyperlipidemia, unspecified: Secondary | ICD-10-CM | POA: Diagnosis not present

## 2022-08-29 DIAGNOSIS — I10 Essential (primary) hypertension: Secondary | ICD-10-CM | POA: Diagnosis not present

## 2022-08-29 DIAGNOSIS — K219 Gastro-esophageal reflux disease without esophagitis: Secondary | ICD-10-CM | POA: Diagnosis not present

## 2022-08-29 DIAGNOSIS — M6281 Muscle weakness (generalized): Secondary | ICD-10-CM | POA: Diagnosis not present

## 2022-08-29 DIAGNOSIS — N179 Acute kidney failure, unspecified: Secondary | ICD-10-CM | POA: Diagnosis not present

## 2022-08-29 DIAGNOSIS — E039 Hypothyroidism, unspecified: Secondary | ICD-10-CM | POA: Diagnosis not present

## 2022-09-08 DIAGNOSIS — N3090 Cystitis, unspecified without hematuria: Secondary | ICD-10-CM | POA: Diagnosis not present

## 2022-09-08 DIAGNOSIS — N390 Urinary tract infection, site not specified: Secondary | ICD-10-CM | POA: Diagnosis not present

## 2022-09-09 DIAGNOSIS — I10 Essential (primary) hypertension: Secondary | ICD-10-CM | POA: Diagnosis not present

## 2022-09-09 DIAGNOSIS — F03918 Unspecified dementia, unspecified severity, with other behavioral disturbance: Secondary | ICD-10-CM | POA: Diagnosis not present

## 2022-09-10 DIAGNOSIS — N390 Urinary tract infection, site not specified: Secondary | ICD-10-CM | POA: Diagnosis not present

## 2022-09-12 DIAGNOSIS — D649 Anemia, unspecified: Secondary | ICD-10-CM | POA: Diagnosis not present

## 2022-09-12 DIAGNOSIS — K219 Gastro-esophageal reflux disease without esophagitis: Secondary | ICD-10-CM | POA: Diagnosis not present

## 2022-09-12 DIAGNOSIS — E039 Hypothyroidism, unspecified: Secondary | ICD-10-CM | POA: Diagnosis not present

## 2022-09-12 DIAGNOSIS — M6281 Muscle weakness (generalized): Secondary | ICD-10-CM | POA: Diagnosis not present

## 2022-09-12 DIAGNOSIS — I10 Essential (primary) hypertension: Secondary | ICD-10-CM | POA: Diagnosis not present

## 2022-09-12 DIAGNOSIS — N179 Acute kidney failure, unspecified: Secondary | ICD-10-CM | POA: Diagnosis not present

## 2022-09-12 DIAGNOSIS — E119 Type 2 diabetes mellitus without complications: Secondary | ICD-10-CM | POA: Diagnosis not present

## 2022-09-16 DIAGNOSIS — I10 Essential (primary) hypertension: Secondary | ICD-10-CM | POA: Diagnosis not present

## 2022-09-17 ENCOUNTER — Encounter (HOSPITAL_COMMUNITY): Payer: Self-pay

## 2022-09-17 ENCOUNTER — Emergency Department (HOSPITAL_COMMUNITY)
Admission: EM | Admit: 2022-09-17 | Discharge: 2022-09-20 | Disposition: A | Discharge status: Skilled Nursing Facility | Payer: Medicare Other | Attending: Emergency Medicine | Admitting: Emergency Medicine

## 2022-09-17 ENCOUNTER — Emergency Department (HOSPITAL_COMMUNITY): Payer: Medicare Other

## 2022-09-17 ENCOUNTER — Other Ambulatory Visit: Payer: Self-pay

## 2022-09-17 DIAGNOSIS — M25461 Effusion, right knee: Secondary | ICD-10-CM | POA: Diagnosis not present

## 2022-09-17 DIAGNOSIS — W1839XA Other fall on same level, initial encounter: Secondary | ICD-10-CM | POA: Insufficient documentation

## 2022-09-17 DIAGNOSIS — M25561 Pain in right knee: Secondary | ICD-10-CM | POA: Diagnosis not present

## 2022-09-17 DIAGNOSIS — R079 Chest pain, unspecified: Secondary | ICD-10-CM | POA: Diagnosis not present

## 2022-09-17 DIAGNOSIS — R531 Weakness: Secondary | ICD-10-CM | POA: Diagnosis not present

## 2022-09-17 DIAGNOSIS — Y92009 Unspecified place in unspecified non-institutional (private) residence as the place of occurrence of the external cause: Secondary | ICD-10-CM

## 2022-09-17 DIAGNOSIS — W19XXXA Unspecified fall, initial encounter: Secondary | ICD-10-CM | POA: Diagnosis not present

## 2022-09-17 DIAGNOSIS — M25551 Pain in right hip: Secondary | ICD-10-CM | POA: Diagnosis not present

## 2022-09-17 DIAGNOSIS — R0902 Hypoxemia: Secondary | ICD-10-CM | POA: Diagnosis not present

## 2022-09-17 DIAGNOSIS — F0390 Unspecified dementia without behavioral disturbance: Secondary | ICD-10-CM | POA: Diagnosis not present

## 2022-09-17 DIAGNOSIS — G319 Degenerative disease of nervous system, unspecified: Secondary | ICD-10-CM | POA: Diagnosis not present

## 2022-09-17 DIAGNOSIS — R296 Repeated falls: Secondary | ICD-10-CM | POA: Diagnosis not present

## 2022-09-17 DIAGNOSIS — Z743 Need for continuous supervision: Secondary | ICD-10-CM | POA: Diagnosis not present

## 2022-09-17 DIAGNOSIS — M1611 Unilateral primary osteoarthritis, right hip: Secondary | ICD-10-CM | POA: Diagnosis not present

## 2022-09-17 DIAGNOSIS — M5136 Other intervertebral disc degeneration, lumbar region: Secondary | ICD-10-CM | POA: Diagnosis not present

## 2022-09-17 DIAGNOSIS — S0990XA Unspecified injury of head, initial encounter: Secondary | ICD-10-CM | POA: Diagnosis not present

## 2022-09-17 DIAGNOSIS — M1711 Unilateral primary osteoarthritis, right knee: Secondary | ICD-10-CM | POA: Diagnosis not present

## 2022-09-17 DIAGNOSIS — Z043 Encounter for examination and observation following other accident: Secondary | ICD-10-CM | POA: Diagnosis present

## 2022-09-17 HISTORY — DX: Type 2 diabetes mellitus without complications: E11.9

## 2022-09-17 LAB — BASIC METABOLIC PANEL
Anion gap: 10 (ref 5–15)
BUN: 11 mg/dL (ref 8–23)
CO2: 26 mmol/L (ref 22–32)
Calcium: 8.6 mg/dL — ABNORMAL LOW (ref 8.9–10.3)
Chloride: 100 mmol/L (ref 98–111)
Creatinine, Ser: 0.83 mg/dL (ref 0.44–1.00)
GFR, Estimated: >60 mL/min (ref >60)
Glucose, Bld: 108 mg/dL — ABNORMAL HIGH (ref 70–99)
Potassium: 3.3 mmol/L — ABNORMAL LOW (ref 3.5–5.1)
Sodium: 136 mmol/L (ref 135–145)

## 2022-09-17 LAB — URINALYSIS, ROUTINE W REFLEX MICROSCOPIC
Bacteria, UA: NONE SEEN
Bilirubin Urine: NEGATIVE
Glucose, UA: NEGATIVE mg/dL
Hgb urine dipstick: NEGATIVE
Ketones, ur: NEGATIVE mg/dL
Nitrite: NEGATIVE
Protein, ur: NEGATIVE mg/dL
Specific Gravity, Urine: 1.016 (ref 1.005–1.030)
pH: 5 (ref 5.0–8.0)

## 2022-09-17 LAB — CBC WITH DIFFERENTIAL/PLATELET
Abs Immature Granulocytes: 0.06 10*3/uL (ref 0.00–0.07)
Basophils Absolute: 0.1 10*3/uL (ref 0.0–0.1)
Basophils Relative: 0 %
Eosinophils Absolute: 0.2 10*3/uL (ref 0.0–0.5)
Eosinophils Relative: 2 %
HCT: 31.2 % — ABNORMAL LOW (ref 36.0–46.0)
Hemoglobin: 10.4 g/dL — ABNORMAL LOW (ref 12.0–15.0)
Immature Granulocytes: 1 %
Lymphocytes Relative: 19 %
Lymphs Abs: 2.4 10*3/uL (ref 0.7–4.0)
MCH: 29.7 pg (ref 26.0–34.0)
MCHC: 33.3 g/dL (ref 30.0–36.0)
MCV: 89.1 fL (ref 80.0–100.0)
Monocytes Absolute: 0.9 10*3/uL (ref 0.1–1.0)
Monocytes Relative: 7 %
Neutro Abs: 9.3 10*3/uL — ABNORMAL HIGH (ref 1.7–7.7)
Neutrophils Relative %: 71 %
Platelets: 355 10*3/uL (ref 150–400)
RBC: 3.5 MIL/uL — ABNORMAL LOW (ref 3.87–5.11)
RDW: 13.2 % (ref 11.5–15.5)
WBC: 12.9 10*3/uL — ABNORMAL HIGH (ref 4.0–10.5)
nRBC: 0 % (ref 0.0–0.2)

## 2022-09-17 MED ORDER — ASPIRIN 325 MG PO TBEC
325.0000 mg | DELAYED_RELEASE_TABLET | Freq: Every day | ORAL | Status: DC
  Administered 2022-09-18 – 2022-09-20 (×3): 325 mg via ORAL
  Filled 2022-09-17 (×3): qty 1

## 2022-09-17 MED ORDER — METOPROLOL TARTRATE 50 MG PO TABS
100.0000 mg | ORAL_TABLET | Freq: Two times a day (BID) | ORAL | Status: DC
  Administered 2022-09-17 – 2022-09-20 (×6): 100 mg via ORAL
  Filled 2022-09-17 (×6): qty 2

## 2022-09-17 MED ORDER — PANTOPRAZOLE SODIUM 40 MG PO TBEC
40.0000 mg | DELAYED_RELEASE_TABLET | Freq: Every day | ORAL | Status: DC
  Administered 2022-09-17 – 2022-09-20 (×4): 40 mg via ORAL
  Filled 2022-09-17 (×4): qty 1

## 2022-09-17 MED ORDER — LEVOTHYROXINE SODIUM 50 MCG PO TABS: 75.0000 ug | ORAL_TABLET | Freq: Every morning | ORAL | Status: DC

## 2022-09-17 MED ORDER — DONEPEZIL HCL 5 MG PO TABS
10.0000 mg | ORAL_TABLET | Freq: Every day | ORAL | Status: DC
  Administered 2022-09-17 – 2022-09-19 (×3): 10 mg via ORAL
  Filled 2022-09-17 (×3): qty 2

## 2022-09-17 MED ORDER — LEVOTHYROXINE SODIUM 50 MCG PO TABS
75.0000 ug | ORAL_TABLET | Freq: Every day | ORAL | Status: DC
  Administered 2022-09-18 – 2022-09-20 (×3): 75 ug via ORAL
  Filled 2022-09-17 (×3): qty 2

## 2022-09-17 MED ORDER — FUROSEMIDE 40 MG PO TABS
40.0000 mg | ORAL_TABLET | Freq: Every day | ORAL | Status: DC
  Administered 2022-09-17 – 2022-09-20 (×4): 40 mg via ORAL
  Filled 2022-09-17 (×4): qty 1

## 2022-09-17 NOTE — ED Triage Notes (Signed)
Per family, patient fell last night and fell this morning. Patient has hx of dementia and has been falling more recently. Patient normally incontinent of urine and bowel. Patient alert & oriented X2. Patient can walk with a cane and has become increasingly weak since September after being at a SNF. Patient denies any complaints.

## 2022-09-17 NOTE — NC FL2 (Signed)
[place] LEVEL OF CARE SCREENING TOOL     IDENTIFICATION  Patient Name: Glenda Nolan Birthdate: 07/20/1935 Sex: female Admission Date (Current Location): 09/17/2022  [facility] and [place] Number:  [employer] and Address:  [facility] [address] [place]      Provider Number: [id number]  Attending Physician Name and Address:  Default, Provider, MD  Relative Name and Phone Number:       Current Level of Care: Hospital Recommended Level of Care: [place] Prior Approval Number:    Date Approved/Denied:   PASRR Number: [id number]  Discharge Plan: SNF    Current Diagnoses: Patient Active Problem List   Diagnosis Date Noted   Pressure injury of skin 08/05/2022   Diarrhea 07/30/2022   Choledocholithiasis 07/28/2022   Dementia ([place]) 07/28/2022   AKI (acute kidney injury) ([place]) 07/28/2022   Diabetes ([place]) 07/28/2022   Hypokalemia 07/28/2022   Spinal stenosis of lumbar region with neurogenic claudication 05/24/2019   Spondylosis without myelopathy or radiculopathy, lumbar region 05/24/2019   S/P TKR (total knee replacement), left 03/18/2017 03/18/2017   Heme positive stool 03/22/2012   Melena 03/22/2012   GERD (gastroesophageal reflux disease) 03/22/2012   CARCINOMA, COLON 04/26/2009   MIGRAINE HEADACHE 04/26/2009   Essential hypertension 04/26/2009   DEGENERATIVE JOINT DISEASE 04/26/2009   IRON DEFICIENCY ANEMIA, HX OF 04/26/2009   KNEE, ARTHRITIS, DEGEN./OSTEO 03/01/2009   KNEE PAIN 03/01/2009    Orientation RESPIRATION BLADDER Height & Weight     Self, Situation  Normal Incontinent Weight: 140 lb (63.5 kg) Height:  '5\' 4"'$  (162.6 cm)  BEHAVIORAL SYMPTOMS/MOOD NEUROLOGICAL BOWEL NUTRITION STATUS      Incontinent Diet (Regular)  AMBULATORY STATUS COMMUNICATION OF NEEDS Skin   Extensive Assist Verbally Normal                       Personal Care Assistance Level of Assistance  Bathing,  Feeding, Dressing Bathing Assistance: Maximum assistance Feeding assistance: Limited assistance Dressing Assistance: Maximum assistance     Functional Limitations Info  Sight, Hearing, Speech Sight Info: Adequate Hearing Info: Adequate Speech Info: Adequate    SPECIAL CARE FACTORS FREQUENCY  PT (By licensed PT), OT (By licensed OT)     PT Frequency: 5 times weekly OT Frequency: 5 times weekly            Contractures Contractures Info: Not present    Additional Factors Info  Code Status, Allergies Code Status Info: FULL Allergies Info: NKA           Current Medications (09/17/2022):  This is the current hospital active medication list Current Facility-Administered Medications  Medication Dose Route Frequency Provider Last Rate Last Admin   [START ON 09/18/2022] aspirin EC tablet 325 mg  325 mg Oral Q breakfast Countryman, Chase, MD       donepezil (ARICEPT) tablet 10 mg  10 mg Oral QHS Countryman, Chase, MD       furosemide (LASIX) tablet 40 mg  40 mg Oral Daily Countryman, Cheri Rous, MD       Derrill Memo ON 09/18/2022] levothyroxine (SYNTHROID) tablet 75 mcg  75 mcg Oral Q0600 Tretha Sciara, MD       metoprolol tartrate (LOPRESSOR) tablet 100 mg  100 mg Oral BID Countryman, Chase, MD       pantoprazole (PROTONIX) EC tablet 40 mg  40 mg Oral Daily Tretha Sciara, MD       Current Outpatient Medications  Medication  Sig Dispense Refill   acetaminophen (TYLENOL) 325 MG tablet Take 325 mg by mouth daily after lunch.      alendronate (FOSAMAX) 70 MG tablet Take 70 mg by mouth every Monday.      aspirin EC 325 MG EC tablet Take 1 tablet (325 mg total) by mouth daily with breakfast. 30 tablet 0   cholecalciferol (VITAMIN D) 1000 UNITS tablet Take 1,000 Units by mouth daily after lunch.      docusate sodium (COLACE) 100 MG capsule Take 1 capsule (100 mg total) by mouth 2 (two) times daily. 10 capsule 0   donepezil (ARICEPT) 5 MG tablet Take 10 mg by mouth at bedtime.      furosemide (LASIX) 20 MG tablet Take 40 mg by mouth daily.      levothyroxine (SYNTHROID) 75 MCG tablet Take 75 mcg by mouth every morning.     metFORMIN (GLUCOPHAGE) 500 MG tablet Take 1 tablet by mouth daily.     metoprolol tartrate (LOPRESSOR) 100 MG tablet TAKE (1) TABLET BY MOUTHDAT BREAKFAST THEN TAKE (1) TABLET BY MOUTH AT BEDTIME FOR HEART RATE.     Multiple Vitamins-Minerals (PX SENIOR VITAMIN PO) Take 1 tablet by mouth daily after lunch.      omeprazole (PRILOSEC) 20 MG capsule Take 20 mg by mouth daily after breakfast. (0800)     potassium chloride (MICRO-K) 10 MEQ CR capsule Take 10 mEq by mouth daily.     raloxifene (EVISTA) 60 MG tablet Take 60 mg by mouth daily after breakfast. (0800)     simvastatin (ZOCOR) 40 MG tablet Take 40 mg by mouth at bedtime.      telmisartan (MICARDIS) 80 MG tablet Take 80 mg by mouth daily.        Discharge Medications: Please see discharge summary for a list of discharge medications.  Relevant Imaging Results:  Relevant Lab Results:   Additional Information SSN: [id number]  [facility] [place]

## 2022-09-17 NOTE — ED Provider Notes (Signed)
[facility] EMERGENCY DEPARTMENT Provider Note   CSN: [id number] Arrival date & time: 09/17/22  1038     History No chief complaint on file.   HPI Glenda Nolan is a 87 y.o. female presenting for falls.  Patient is a 87 year old female with a history of dementia who requires her family for completion of ADLs was had multiple falls over the past 24 hours.  She has been found down in her bedroom 20s.  She supposed use a walker but because of her dementia does not use it.  She lives with her 2 sisters.  Recently admitted to a skilled nursing facility with rehab and did not improve while there but was ultimately discharged.  She denies fevers chills, nausea vomiting causing shortness of breath.  Family agrees. No known sick contacts..   Patient's recorded medical, surgical, social, medication list and allergies were reviewed in the Snapshot window as part of the initial history.   Review of Systems   Review of Systems  Unable to perform ROS: Dementia    Physical Exam Updated Vital Signs There were no vitals taken for this visit. Physical Exam Vitals and nursing note reviewed.  Constitutional:      General: She is not in acute distress.    Appearance: She is well-developed.  HENT:     Head: Normocephalic and atraumatic.  Eyes:     Conjunctiva/sclera: Conjunctivae normal.  Cardiovascular:     Rate and Rhythm: Normal rate and regular rhythm.     Heart sounds: No murmur heard. Pulmonary:     Effort: Pulmonary effort is normal. No respiratory distress.     Breath sounds: Normal breath sounds.  Abdominal:     Palpations: Abdomen is soft.     Tenderness: There is no abdominal tenderness.  Musculoskeletal:        General: Tenderness (TTP right hip, right knee) present. No swelling.     Cervical back: Neck supple.  Skin:    General: Skin is warm and dry.     Capillary Refill: Capillary refill takes less than 2 seconds.  Neurological:     Mental Status: She is alert.   Psychiatric:        Mood and Affect: Mood normal.      ED Course/ Medical Decision Making/ A&P Clinical Course as of 09/17/22 1534  Wed Sep 17, 2022  1359 Labs [CC]    Clinical Course User Index [CC] Tretha Sciara, MD    Procedures Procedures   Medications Ordered in ED Medications - No data to display  Medical Decision Making:    Glenda Nolan is a 87 y.o. female who presented to the ED today with a moderate mechanisma trauma, detailed above.    Patient's presentation is complicated by their history of multiple comorbid medical problems.  Patient placed on continuous vitals and telemetry monitoring while in ED which was reviewed periodically.   Given this mechanism of trauma, a full physical exam was performed. Notably, patient was hemodynamically stable in no acute distress.  She is GCS 14, pleasantly confused.  She has severe tenderness palpation of her right hip pain with any motion of her right leg.  She has not walked since her most recent fall.  No other injuries appreciated.   Reviewed and confirmed nursing documentation for past medical history, family history, social history.    Initial Assessment/Plan:   This is a patient presenting with a moderate mechanism trauma.  As such, I have considered intracranial injuries including intracranial  hemorrhage, intrathoracic injuries including blunt myocardial or blunt lung injury, blunt abdominal injuries including aortic dissection, bladder injury, spleen injury, liver injury and I have considered orthopedic injuries including extremity or spinal injury.  With the patient's presentation of moderate mechanism trauma but an otherwise reassuring exam, patient warrants targeted evaluation for potential traumatic injuries. Will proceed with targeted evaluation for potential injuries. Will proceed with right hip x-ray, right knee x-ray, CT head, screening labs including CBC, BMP, UA to evaluate for etiology of patient's failure to  thrive in the outpatient setting. Objective evaluation resulted with no acute pathology.   Final Reassessment and Plan:   I reassessed the patient at bedside.  Patient's family are stating that they cannot continue to take care of her and are refusing to take her back home.  They worry that she will continue to fall at home as she has had 2 falls in the last 24 hours. Placed patient into boarder status, ordered home medications, diet and consultation with transitions of care for ongoing safe disposition management.  Clinical Impression: No diagnosis found.   Data Unavailable   Final Clinical Impression(s) / ED Diagnoses Final diagnoses:  None    Rx / DC Orders ED Discharge Orders     None         Tretha Sciara, MD 09/17/22 1553

## 2022-09-17 NOTE — Progress Notes (Signed)
TOC consulted for SNF placement. CSW spoke with pt and daughters in room about interest in SNF. They are interested in placement. CSW spoke to [place] with [facility] who states pt has used 48 of her SNF days. Jackelyn Poling confirms that pts Medicaid is processing. Jackelyn Poling states they will be able to take pt back for SNF.  Pts family confirm they are interested in this and would like [facility] as the facility. CSW to complete Fl2 and send out. TOC to start insurance auth once PT has completed eval. TOC to follow.

## 2022-09-18 MED ORDER — METFORMIN HCL 500 MG PO TABS
500.0000 mg | ORAL_TABLET | Freq: Every day | ORAL | Status: DC
  Administered 2022-09-18 – 2022-09-20 (×3): 500 mg via ORAL
  Filled 2022-09-18 (×3): qty 1

## 2022-09-18 MED ORDER — SIMVASTATIN 10 MG PO TABS
40.0000 mg | ORAL_TABLET | Freq: Every day | ORAL | Status: DC
  Administered 2022-09-18 – 2022-09-19 (×2): 40 mg via ORAL
  Filled 2022-09-18 (×2): qty 4

## 2022-09-18 MED ORDER — IRBESARTAN 75 MG PO TABS
75.0000 mg | ORAL_TABLET | Freq: Every day | ORAL | Status: DC
  Administered 2022-09-18 – 2022-09-20 (×3): 75 mg via ORAL
  Filled 2022-09-18 (×3): qty 1

## 2022-09-18 MED ORDER — POTASSIUM CHLORIDE CRYS ER 20 MEQ PO TBCR
10.0000 meq | EXTENDED_RELEASE_TABLET | Freq: Every day | ORAL | Status: DC
  Administered 2022-09-18 – 2022-09-20 (×3): 10 meq via ORAL
  Filled 2022-09-18 (×3): qty 1

## 2022-09-18 MED ORDER — VITAMIN D 25 MCG (1000 UNIT) PO TABS
1000.0000 [IU] | ORAL_TABLET | Freq: Every day | ORAL | Status: DC
  Administered 2022-09-18 – 2022-09-20 (×3): 1000 [IU] via ORAL
  Filled 2022-09-18 (×3): qty 1

## 2022-09-18 MED ORDER — RALOXIFENE HCL 60 MG PO TABS
60.0000 mg | ORAL_TABLET | Freq: Every day | ORAL | Status: DC
  Administered 2022-09-19 – 2022-09-20 (×2): 60 mg via ORAL
  Filled 2022-09-18 (×4): qty 1

## 2022-09-18 NOTE — ED Notes (Signed)
PT evaluating pt.

## 2022-09-18 NOTE — Plan of Care (Signed)
  Problem: Acute Rehab PT Goals(only PT should resolve) Goal: Pt Will Go Supine/Side To Sit Outcome: Progressing Flowsheets (Taken 09/18/2022 1144) Pt will go Supine/Side to Sit: with minimal assist Goal: Patient Will Transfer Sit To/From Stand Outcome: Progressing Flowsheets (Taken 09/18/2022 1144) Patient will transfer sit to/from stand: with moderate assist Goal: Pt Will Transfer Bed To Chair/Chair To Bed Outcome: Progressing Flowsheets (Taken 09/18/2022 1144) Pt will Transfer Bed to Chair/Chair to Bed: with mod assist Goal: Pt Will Ambulate Outcome: Progressing Flowsheets (Taken 09/18/2022 1144) Pt will Ambulate:  with moderate assist  10 feet  with rolling walker   Zigmund Gottron, SPT

## 2022-09-18 NOTE — ED Notes (Signed)
Pt's daughter at bedside visiting.

## 2022-09-18 NOTE — Evaluation (Signed)
Physical Therapy Evaluation Patient Details Name: Glenda Nolan MRN: [id number] DOB: May 14, 1935 Today's Date: 09/18/2022  History of Present Illness  Glenda Nolan is a 87 y.o. female patient with history of dementia who presents to the emergency department after mechanical fall.  Family is at bedside and provides most of the history.  They state that they last talked to her around 3 PM yesterday and when they went to visit her today around noon they found her on the floor.  Patient unable to answer when she fell.  They found her in the bathroom with her head underneath the toilet.  Patient has no physical somatic complaints at this time.   Clinical Impression  Patient demonstrates labored movement for sitting up at bedside requiring mod assist to elevate trunk and scoot to EOB. Patient able to stand and transfer to chair with max assist and RW with poor carryover for use. Patient transferred back to bed with squat pivot transfer and without RW due to unsafe use. Patient left in bed after therapy - RN aware. Patient will benefit from continued skilled physical therapy in hospital and recommended venue below to increase strength, balance, endurance for safe ADLs and gait.     Recommendations for follow up therapy are one component of a multi-disciplinary discharge planning process, led by the attending physician.  Recommendations may be updated based on patient status, additional functional criteria and insurance authorization.  Follow Up Recommendations Skilled nursing-short term rehab (<3 hours/day) Can patient physically be transported by private vehicle: No    Assistance Recommended at Discharge Intermittent Supervision/Assistance  Patient can return home with the following  A lot of help with walking and/or transfers;A lot of help with bathing/dressing/bathroom;Assistance with cooking/housework;Help with stairs or ramp for entrance    Equipment Recommendations None recommended by PT   Recommendations for Other Services       Functional Status Assessment Patient has had a recent decline in their functional status and demonstrates the ability to make significant improvements in function in a reasonable and predictable amount of time.     Precautions / Restrictions Precautions Precautions: Fall Restrictions Weight Bearing Restrictions: No      Mobility  Bed Mobility Overal bed mobility: Needs Assistance Bed Mobility: Supine to Sit     Supine to sit: Mod assist     General bed mobility comments: labored movement, increased time, requiring mod assist for elevating trunk and scooting to EOB    Transfers Overall transfer level: Needs assistance Equipment used: Rolling walker (2 wheels), None Transfers: Sit to/from Stand, Bed to chair/wheelchair/BSC Sit to Stand: Max assist   Step pivot transfers: Max assist       General transfer comment: patient requiring max assist to stand and transfer to chair using RW with poor carryover, requires verbal cueing to step towards chair and for walker placement. Patient transferred back to bed with max assist squat pivot transfer due to poor and unsafe use of RW    Ambulation/Gait Ambulation/Gait assistance: Max assist Gait Distance (Feet): 3 Feet Assistive device: Rolling walker (2 wheels) Gait Pattern/deviations: Decreased step length - left, Decreased step length - right, Decreased stride length, Narrow base of support Gait velocity: slow     General Gait Details: patient limited to a few unsteady side steps at bedside to transfer to chair with max assist/RW  Stairs            Wheelchair Mobility    Modified Rankin (Stroke Patients Only)  Balance Overall balance assessment: Needs assistance Sitting-balance support: Bilateral upper extremity supported, Feet supported Sitting balance-Leahy Scale: Fair Sitting balance - Comments: seated EOB   Standing balance support: No upper extremity  supported, Bilateral upper extremity supported, During functional activity, Reliant on assistive device for balance Standing balance-Leahy Scale: Poor Standing balance comment: poor with and without RW due to unsafe use of AD                             Pertinent Vitals/Pain Pain Assessment Pain Assessment: Faces Faces Pain Scale: Hurts whole lot Pain Location: right LE Pain Descriptors / Indicators: Guarding, Grimacing Pain Intervention(s): Limited activity within patient's tolerance, Monitored during session, Repositioned    Home Living Family/patient expects to be discharged to:: Private residence                   Additional Comments: Patient is a poor historian    Prior Function Prior Level of Function : Other (comment) (Patient is a poor historian)                     Hand Dominance        Extremity/Trunk Assessment   Upper Extremity Assessment Upper Extremity Assessment: Generalized weakness    Lower Extremity Assessment Lower Extremity Assessment: Generalized weakness    Cervical / Trunk Assessment Cervical / Trunk Assessment: Normal  Communication   Communication: Expressive difficulties  Cognition Arousal/Alertness: Awake/alert Behavior During Therapy: WFL for tasks assessed/performed Overall Cognitive Status: History of cognitive impairments - at baseline                                          General Comments      Exercises     Assessment/Plan    PT Assessment Patient needs continued PT services  PT Problem List Decreased strength;Decreased balance;Decreased activity tolerance;Decreased mobility       PT Treatment Interventions DME instruction;Gait training;Stair training;Functional mobility training;Therapeutic activities;Therapeutic exercise;Balance training;Patient/family education    PT Goals (Current goals can be found in the Care Plan section)  Acute Rehab PT Goals Patient Stated Goal: not  stated PT Goal Formulation: With patient Time For Goal Achievement: 09/25/22 Potential to Achieve Goals: Good    Frequency Min 2X/week     Co-evaluation               AM-PAC PT "6 Clicks" Mobility  Outcome Measure Help needed turning from your back to your side while in a flat bed without using bedrails?: A Little Help needed moving from lying on your back to sitting on the side of a flat bed without using bedrails?: A Lot Help needed moving to and from a bed to a chair (including a wheelchair)?: A Lot Help needed standing up from a chair using your arms (e.g., wheelchair or bedside chair)?: A Lot Help needed to walk in hospital room?: A Lot Help needed climbing 3-5 steps with a railing? : A Lot 6 Click Score: 13    End of Session   Activity Tolerance: Patient tolerated treatment well;Patient limited by fatigue Patient left: in bed;with call bell/phone within reach Nurse Communication: Mobility status PT Visit Diagnosis: Unsteadiness on feet (R26.81);Other abnormalities of gait and mobility (R26.89);Muscle weakness (generalized) (M62.81)    Time: 5916-3846 PT Time Calculation (min) (ACUTE ONLY): 20 min   Charges:  PT Evaluation $PT Eval Moderate Complexity: 1 Mod PT Treatments $Therapeutic Activity: 8-22 mins        Zigmund Gottron, SPT

## 2022-09-18 NOTE — Progress Notes (Signed)
Pts insurance Glenda Nolan has been started. Insurance auth pending at this time. TOC to provide updates when able.

## 2022-09-18 NOTE — ED Notes (Signed)
Took the breakfast tray to pt.

## 2022-09-18 NOTE — ED Notes (Signed)
Pt given lunch tray.

## 2022-09-19 NOTE — ED Notes (Signed)
Lunch tray given to pt.

## 2022-09-19 NOTE — ED Notes (Signed)
Breakfast tray given. °

## 2022-09-19 NOTE — Progress Notes (Signed)
Ins Auth still pending.

## 2022-09-19 NOTE — Progress Notes (Signed)
CSW spoke [name] from the facility, Glenda Nolan states that she can take the Pt in the AM. Please call in AM to get report number for [facility].

## 2022-09-20 DIAGNOSIS — E119 Type 2 diabetes mellitus without complications: Secondary | ICD-10-CM | POA: Diagnosis not present

## 2022-09-20 DIAGNOSIS — I10 Essential (primary) hypertension: Secondary | ICD-10-CM | POA: Diagnosis not present

## 2022-09-20 DIAGNOSIS — R509 Fever, unspecified: Secondary | ICD-10-CM | POA: Diagnosis not present

## 2022-09-20 DIAGNOSIS — J302 Other seasonal allergic rhinitis: Secondary | ICD-10-CM | POA: Diagnosis not present

## 2022-09-20 DIAGNOSIS — U071 COVID-19: Secondary | ICD-10-CM | POA: Diagnosis not present

## 2022-09-20 DIAGNOSIS — M25561 Pain in right knee: Secondary | ICD-10-CM | POA: Diagnosis not present

## 2022-09-20 DIAGNOSIS — M79601 Pain in right arm: Secondary | ICD-10-CM | POA: Diagnosis not present

## 2022-09-20 DIAGNOSIS — D649 Anemia, unspecified: Secondary | ICD-10-CM | POA: Diagnosis not present

## 2022-09-20 DIAGNOSIS — K219 Gastro-esophageal reflux disease without esophagitis: Secondary | ICD-10-CM | POA: Diagnosis not present

## 2022-09-20 DIAGNOSIS — R1310 Dysphagia, unspecified: Secondary | ICD-10-CM | POA: Diagnosis not present

## 2022-09-20 DIAGNOSIS — R262 Difficulty in walking, not elsewhere classified: Secondary | ICD-10-CM | POA: Diagnosis not present

## 2022-09-20 DIAGNOSIS — M25551 Pain in right hip: Secondary | ICD-10-CM | POA: Diagnosis not present

## 2022-09-20 DIAGNOSIS — R2689 Other abnormalities of gait and mobility: Secondary | ICD-10-CM | POA: Diagnosis not present

## 2022-09-20 DIAGNOSIS — R5381 Other malaise: Secondary | ICD-10-CM | POA: Diagnosis not present

## 2022-09-20 DIAGNOSIS — M810 Age-related osteoporosis without current pathological fracture: Secondary | ICD-10-CM | POA: Diagnosis not present

## 2022-09-20 DIAGNOSIS — E785 Hyperlipidemia, unspecified: Secondary | ICD-10-CM | POA: Diagnosis not present

## 2022-09-20 DIAGNOSIS — F03918 Unspecified dementia, unspecified severity, with other behavioral disturbance: Secondary | ICD-10-CM | POA: Diagnosis not present

## 2022-09-20 DIAGNOSIS — R293 Abnormal posture: Secondary | ICD-10-CM | POA: Diagnosis not present

## 2022-09-20 DIAGNOSIS — E039 Hypothyroidism, unspecified: Secondary | ICD-10-CM | POA: Diagnosis not present

## 2022-09-20 DIAGNOSIS — M6281 Muscle weakness (generalized): Secondary | ICD-10-CM | POA: Diagnosis not present

## 2022-09-20 DIAGNOSIS — K8000 Calculus of gallbladder with acute cholecystitis without obstruction: Secondary | ICD-10-CM | POA: Diagnosis not present

## 2022-09-20 DIAGNOSIS — R296 Repeated falls: Secondary | ICD-10-CM | POA: Diagnosis not present

## 2022-09-20 DIAGNOSIS — M25511 Pain in right shoulder: Secondary | ICD-10-CM | POA: Diagnosis not present

## 2022-09-20 NOTE — ED Notes (Signed)
Patient is resting comfortably. 

## 2022-09-20 NOTE — Discharge Instructions (Addendum)
Glenda Nolan was seen today for a fall.  Her family does not feel comfortable taking care of her because of her worsening dementia and fear of more frequent falls at home.  They have requested advanced care and management.  Physical therapy agreed with this plan.

## 2022-09-20 NOTE — ED Notes (Signed)
Spoke with Colletta Maryland, case manager informed nurse authorization is pending and placement will be on hold until approval.

## 2022-09-20 NOTE — ED Notes (Signed)
AVS faxed to  [phone]

## 2022-09-20 NOTE — ED Notes (Signed)
Social worker  Ricquita notified nurse patients authorization approved and patient will be  transferred to [facility] room #B2 Bed 2. Family notified

## 2022-09-20 NOTE — ED Notes (Signed)
Spoke with

## 2022-09-20 NOTE — ED Provider Notes (Signed)
Emergency Medicine Observation Re-evaluation Note  Glenda Nolan is a 87 y.o. female, seen on rounds today.  Pt initially presented to the ED for complaints of Fall Currently, the patient is in ED boarder status.  Physical Exam  BP 114/75 (BP Location: Right Arm)   Pulse 60   Temp 97.7 F (36.5 C) (Oral)   Resp 18   Ht '5\' 4"'$  (1.626 m)   Wt 63.5 kg   SpO2 100%   BMI 24.03 kg/m  Physical Exam General: Appears to be resting comfortably in bed, no acute distress. Cardiac: Regular rate, normal heart rate, non-emergent blood pressure for this morning's vitals. Lungs: No increased work of breathing.  Equal chest rise appreciated Psych: Calm, asleep in bed.   ED Course / MDM  EKG:   I have reviewed the labs performed to date as well as medications administered while in observation.    Plan  Current plan is for placement at skilled nursing facility later today.    Tretha Sciara, MD 09/20/22 [phone]

## 2022-09-20 NOTE — ED Notes (Signed)
Spoke with admission with [facility], informed nurse admission paperwork needs completing. Spoke with Colletta Maryland, case manager regarding completion of admission package.

## 2022-09-20 NOTE — ED Notes (Signed)
Emptied canister for urine; 800 ml of urine noted

## 2022-09-20 NOTE — Care Management (Signed)
Per R. Tarpley CSW, authorization for SNF placement is still pending in [place]. Message sent to RN. Patient will continue to stay in ED until authorization is complete.

## 2022-09-20 NOTE — Progress Notes (Signed)
TOC CSW has obtained ins.auth.  Plan Auth ID:  [id number]  Candace Cruise ID:  [id number]  Review Date:  09/23/2022  Camil Hausmann Tarpley-Carter, MSW, LCSW-A Pronouns:  She/Her/Hers [facility] HealthTransitions of Care Clinical Social Worker Direct Number:  [phone] [email or web address]

## 2022-09-25 DIAGNOSIS — I10 Essential (primary) hypertension: Secondary | ICD-10-CM | POA: Diagnosis not present

## 2022-09-25 DIAGNOSIS — E119 Type 2 diabetes mellitus without complications: Secondary | ICD-10-CM | POA: Diagnosis not present

## 2022-09-25 DIAGNOSIS — E039 Hypothyroidism, unspecified: Secondary | ICD-10-CM | POA: Diagnosis not present

## 2022-09-25 DIAGNOSIS — K219 Gastro-esophageal reflux disease without esophagitis: Secondary | ICD-10-CM | POA: Diagnosis not present

## 2022-09-25 DIAGNOSIS — M810 Age-related osteoporosis without current pathological fracture: Secondary | ICD-10-CM | POA: Diagnosis not present

## 2022-09-25 DIAGNOSIS — M6281 Muscle weakness (generalized): Secondary | ICD-10-CM | POA: Diagnosis not present

## 2022-09-25 DIAGNOSIS — E785 Hyperlipidemia, unspecified: Secondary | ICD-10-CM | POA: Diagnosis not present

## 2022-09-29 DIAGNOSIS — K219 Gastro-esophageal reflux disease without esophagitis: Secondary | ICD-10-CM | POA: Diagnosis not present

## 2022-09-29 DIAGNOSIS — E119 Type 2 diabetes mellitus without complications: Secondary | ICD-10-CM | POA: Diagnosis not present

## 2022-09-29 DIAGNOSIS — I10 Essential (primary) hypertension: Secondary | ICD-10-CM | POA: Diagnosis not present

## 2022-09-29 DIAGNOSIS — M6281 Muscle weakness (generalized): Secondary | ICD-10-CM | POA: Diagnosis not present

## 2022-09-29 DIAGNOSIS — E039 Hypothyroidism, unspecified: Secondary | ICD-10-CM | POA: Diagnosis not present

## 2022-09-29 DIAGNOSIS — M810 Age-related osteoporosis without current pathological fracture: Secondary | ICD-10-CM | POA: Diagnosis not present

## 2022-09-29 DIAGNOSIS — D649 Anemia, unspecified: Secondary | ICD-10-CM | POA: Diagnosis not present

## 2022-10-02 IMAGING — DX DG LUMBAR SPINE 2-3V
3 series · 3 of 3 positions shown · non-contrast
Comparison: MRI lumbar spine dated April 20, 2019.

CLINICAL DATA: Chronic low back pain.

EXAM:
LUMBAR SPINE - 2-3 VIEW

[l-spine ap]
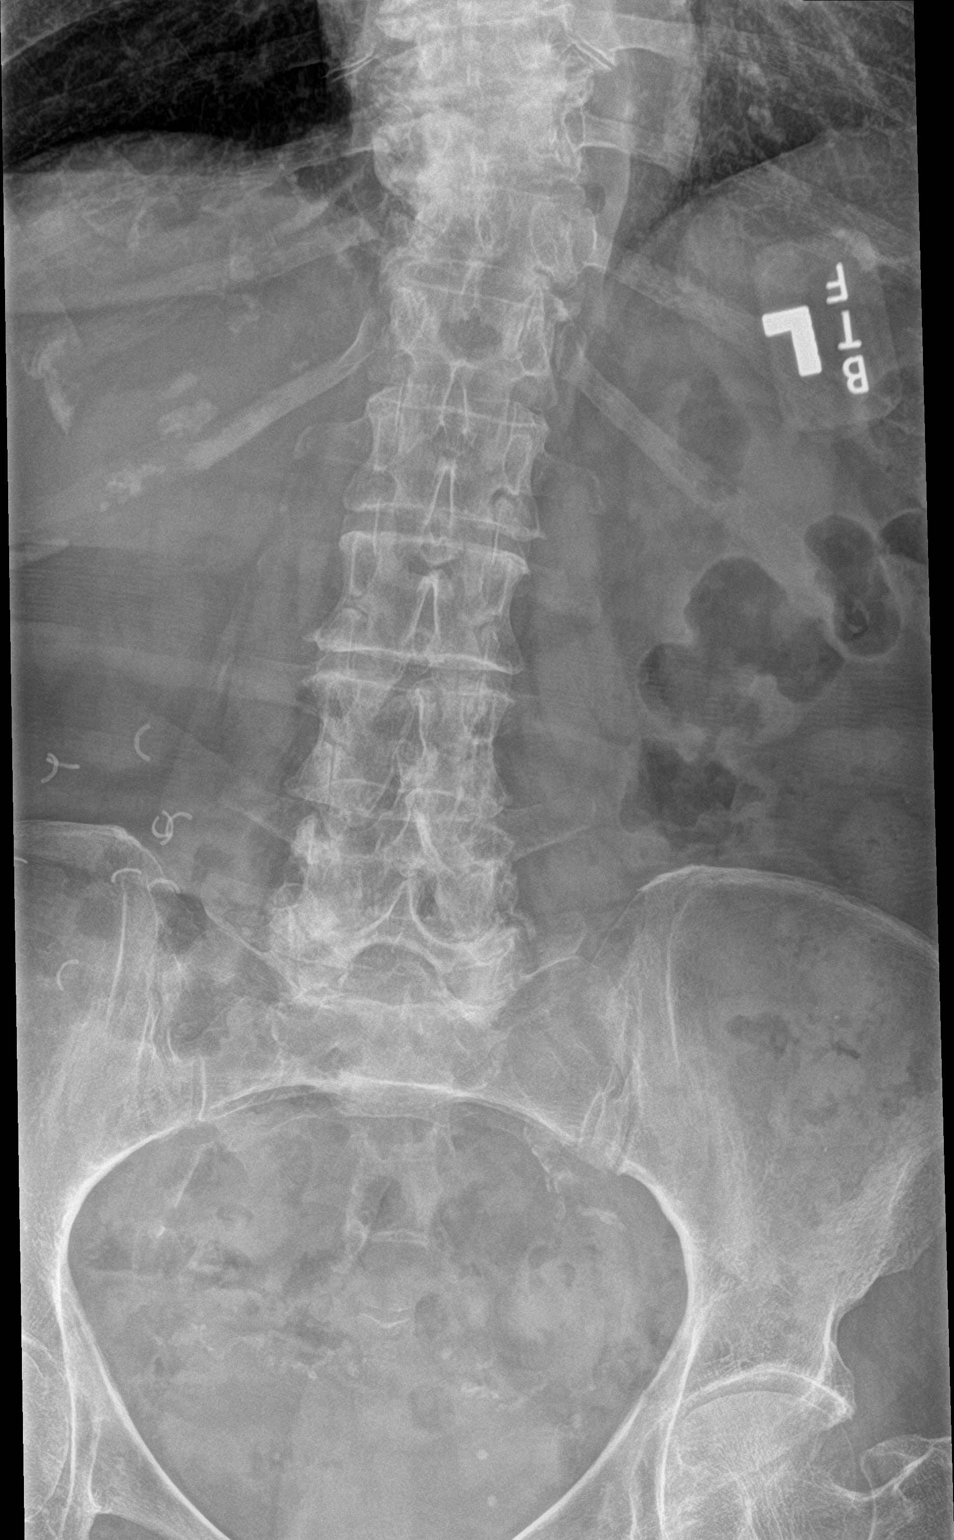

[l-spine lat]
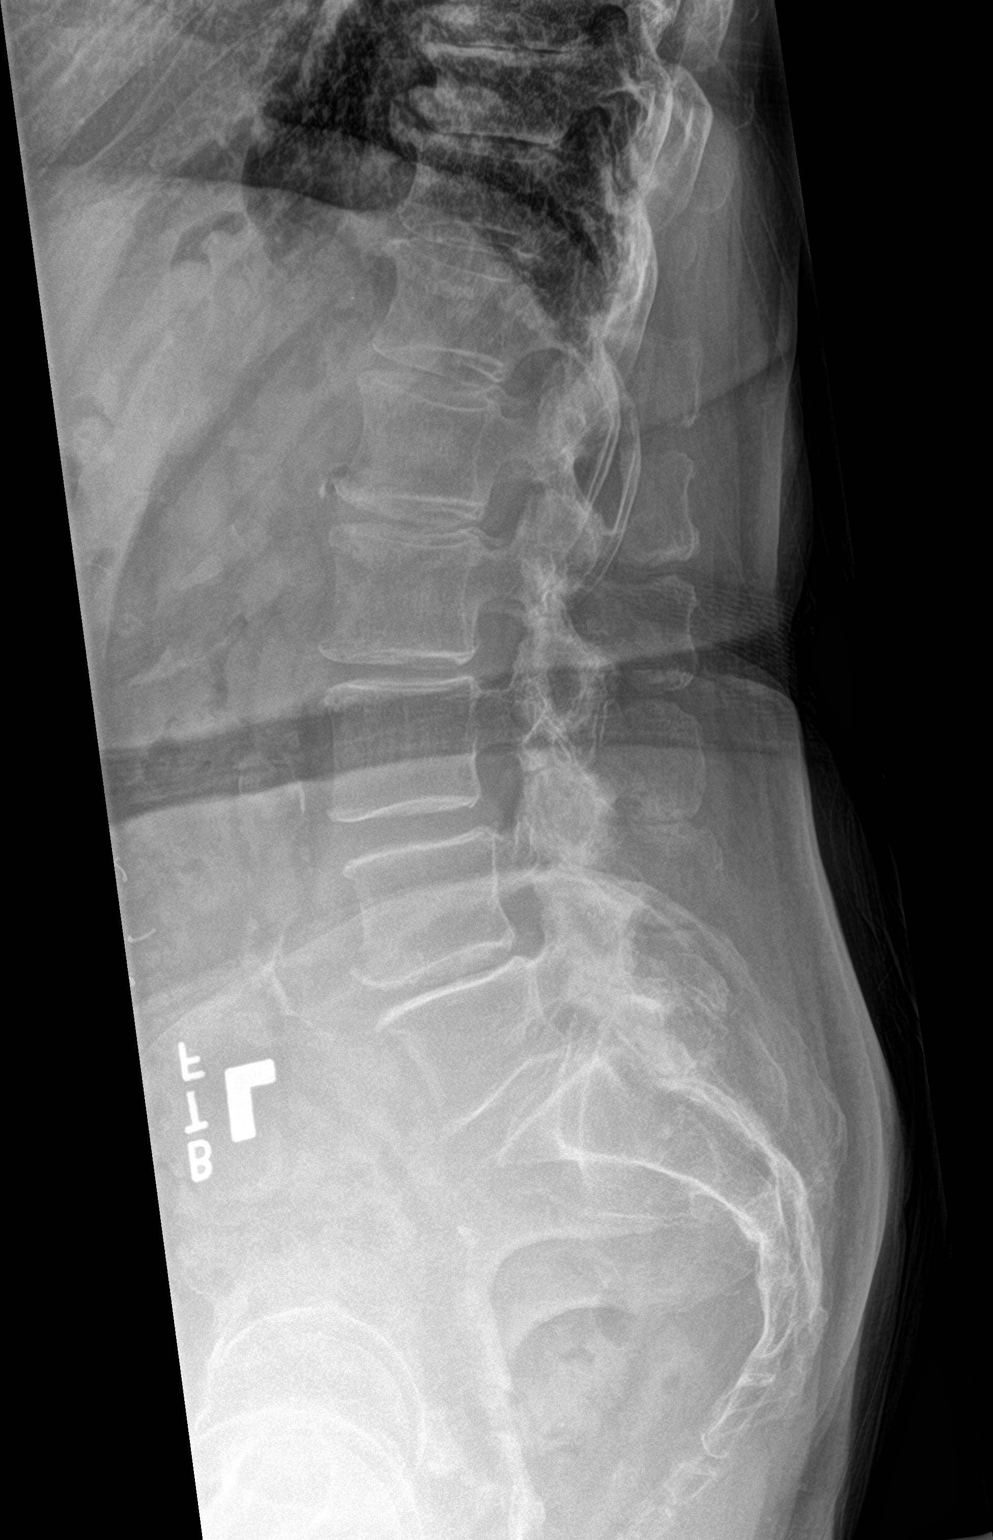

[l-spine spot]
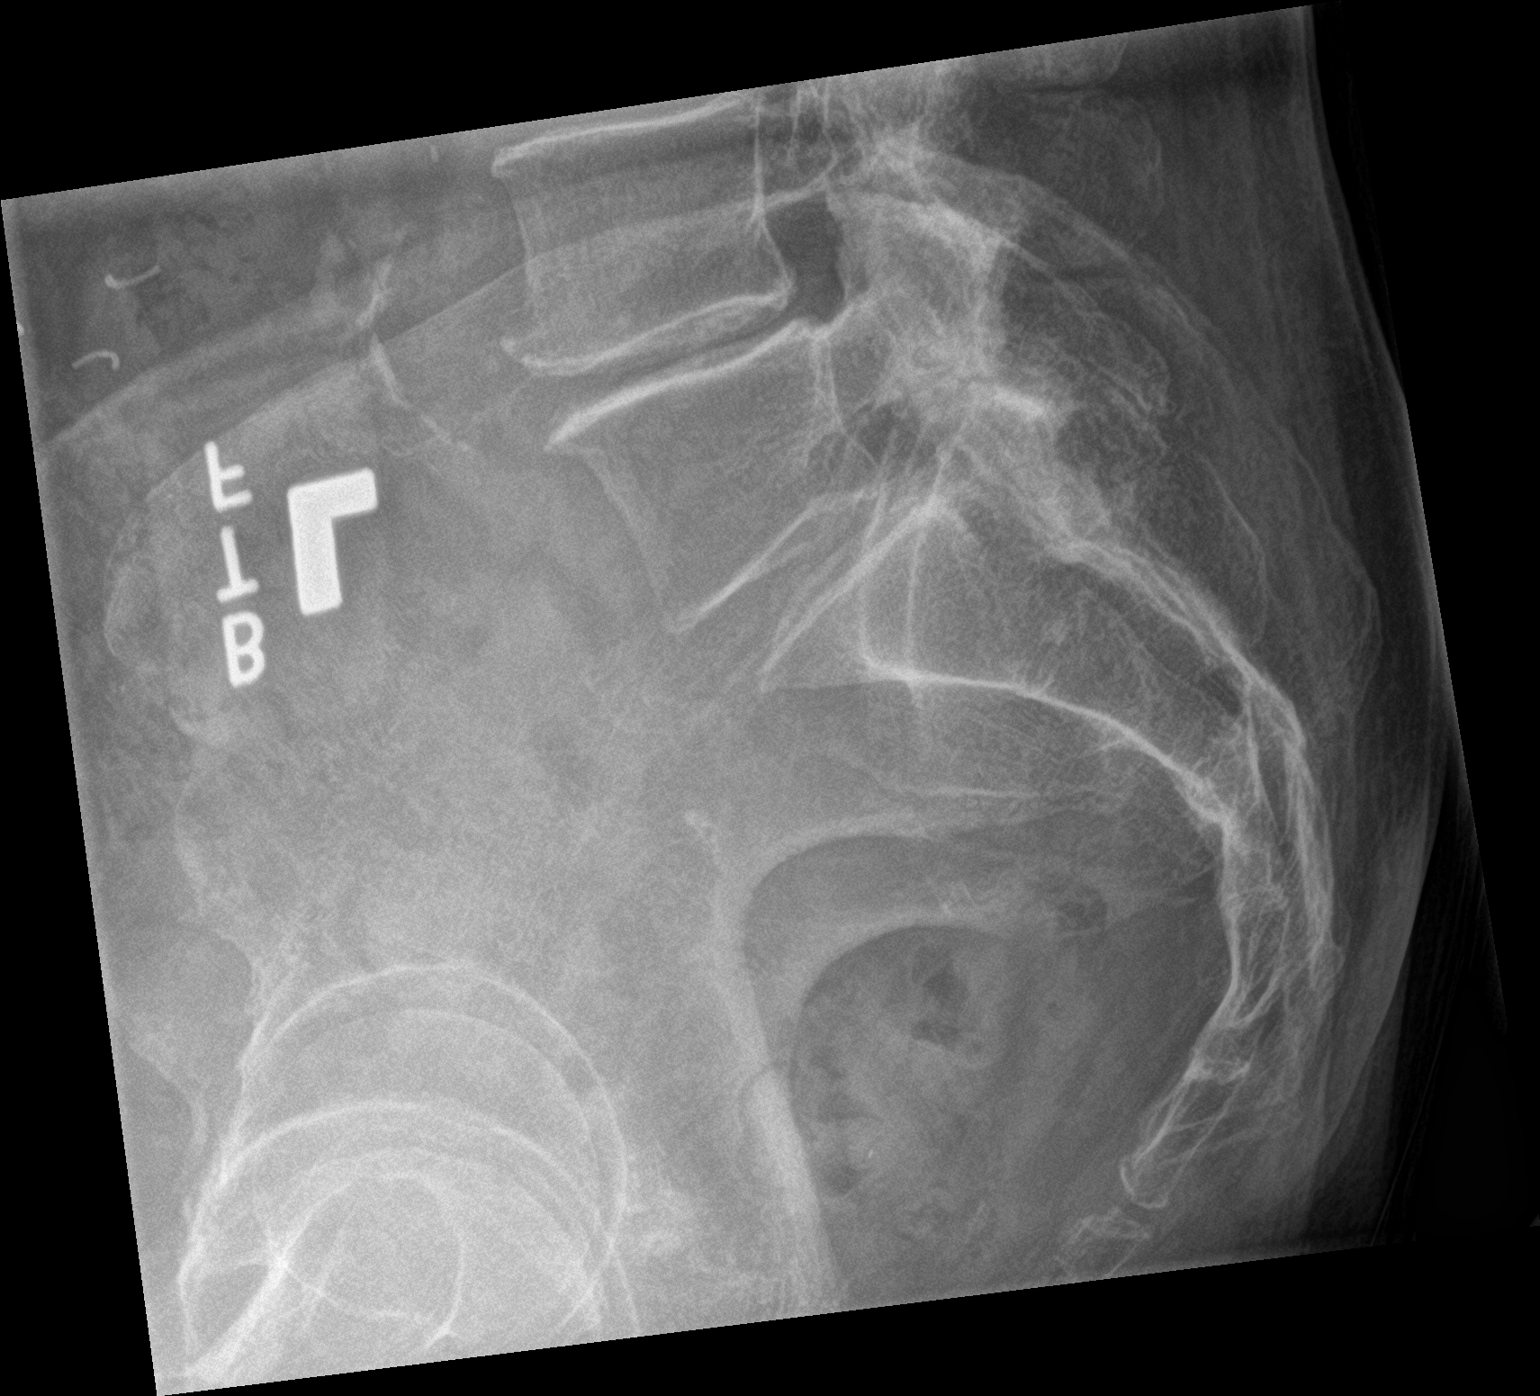

[3 of 3 positions shown; findings below may reference images not displayed]

FINDINGS: Five lumbar type vertebral bodies.

No acute fracture or subluxation. Vertebral body heights are
preserved.

Unchanged trace stepwise anterolisthesis from L2-L3 through L5-S1.

Unchanged multilevel disc height loss, mild-to-moderate at L2-L3 and
L4-L5.

The sacroiliac joints are unremarkable.
IMPRESSION: 1. Unchanged multilevel lumbar spondylosis as described above.

## 2022-10-03 DIAGNOSIS — J302 Other seasonal allergic rhinitis: Secondary | ICD-10-CM | POA: Diagnosis not present

## 2022-10-08 DIAGNOSIS — E785 Hyperlipidemia, unspecified: Secondary | ICD-10-CM | POA: Diagnosis not present

## 2022-10-08 DIAGNOSIS — M810 Age-related osteoporosis without current pathological fracture: Secondary | ICD-10-CM | POA: Diagnosis not present

## 2022-10-08 DIAGNOSIS — J302 Other seasonal allergic rhinitis: Secondary | ICD-10-CM | POA: Diagnosis not present

## 2022-10-08 DIAGNOSIS — M6281 Muscle weakness (generalized): Secondary | ICD-10-CM | POA: Diagnosis not present

## 2022-10-08 DIAGNOSIS — E119 Type 2 diabetes mellitus without complications: Secondary | ICD-10-CM | POA: Diagnosis not present

## 2022-10-08 DIAGNOSIS — E039 Hypothyroidism, unspecified: Secondary | ICD-10-CM | POA: Diagnosis not present

## 2022-10-09 DIAGNOSIS — I10 Essential (primary) hypertension: Secondary | ICD-10-CM | POA: Diagnosis not present

## 2022-10-09 DIAGNOSIS — F03918 Unspecified dementia, unspecified severity, with other behavioral disturbance: Secondary | ICD-10-CM | POA: Diagnosis not present

## 2022-10-13 DIAGNOSIS — M6281 Muscle weakness (generalized): Secondary | ICD-10-CM | POA: Diagnosis not present

## 2022-10-13 DIAGNOSIS — M810 Age-related osteoporosis without current pathological fracture: Secondary | ICD-10-CM | POA: Diagnosis not present

## 2022-10-13 DIAGNOSIS — E119 Type 2 diabetes mellitus without complications: Secondary | ICD-10-CM | POA: Diagnosis not present

## 2022-10-13 DIAGNOSIS — J302 Other seasonal allergic rhinitis: Secondary | ICD-10-CM | POA: Diagnosis not present

## 2022-10-13 DIAGNOSIS — E039 Hypothyroidism, unspecified: Secondary | ICD-10-CM | POA: Diagnosis not present

## 2022-10-13 DIAGNOSIS — E785 Hyperlipidemia, unspecified: Secondary | ICD-10-CM | POA: Diagnosis not present

## 2022-10-17 DIAGNOSIS — M810 Age-related osteoporosis without current pathological fracture: Secondary | ICD-10-CM | POA: Diagnosis not present

## 2022-10-17 DIAGNOSIS — J302 Other seasonal allergic rhinitis: Secondary | ICD-10-CM | POA: Diagnosis not present

## 2022-10-17 DIAGNOSIS — E039 Hypothyroidism, unspecified: Secondary | ICD-10-CM | POA: Diagnosis not present

## 2022-10-17 DIAGNOSIS — M6281 Muscle weakness (generalized): Secondary | ICD-10-CM | POA: Diagnosis not present

## 2022-10-17 DIAGNOSIS — E785 Hyperlipidemia, unspecified: Secondary | ICD-10-CM | POA: Diagnosis not present

## 2022-10-17 DIAGNOSIS — E119 Type 2 diabetes mellitus without complications: Secondary | ICD-10-CM | POA: Diagnosis not present

## 2022-10-20 DIAGNOSIS — M6281 Muscle weakness (generalized): Secondary | ICD-10-CM | POA: Diagnosis not present

## 2022-10-20 DIAGNOSIS — F03918 Unspecified dementia, unspecified severity, with other behavioral disturbance: Secondary | ICD-10-CM | POA: Diagnosis not present

## 2022-10-20 DIAGNOSIS — K8000 Calculus of gallbladder with acute cholecystitis without obstruction: Secondary | ICD-10-CM | POA: Diagnosis not present

## 2022-10-20 DIAGNOSIS — R5381 Other malaise: Secondary | ICD-10-CM | POA: Diagnosis not present

## 2022-10-20 DIAGNOSIS — R262 Difficulty in walking, not elsewhere classified: Secondary | ICD-10-CM | POA: Diagnosis not present

## 2022-10-20 DIAGNOSIS — R1310 Dysphagia, unspecified: Secondary | ICD-10-CM | POA: Diagnosis not present

## 2022-10-20 DIAGNOSIS — R2689 Other abnormalities of gait and mobility: Secondary | ICD-10-CM | POA: Diagnosis not present

## 2022-10-22 DIAGNOSIS — M25511 Pain in right shoulder: Secondary | ICD-10-CM | POA: Diagnosis not present

## 2022-10-22 DIAGNOSIS — R5381 Other malaise: Secondary | ICD-10-CM | POA: Diagnosis not present

## 2022-10-24 DIAGNOSIS — M25511 Pain in right shoulder: Secondary | ICD-10-CM | POA: Diagnosis not present

## 2022-10-24 DIAGNOSIS — F03918 Unspecified dementia, unspecified severity, with other behavioral disturbance: Secondary | ICD-10-CM | POA: Diagnosis not present

## 2022-10-24 DIAGNOSIS — R2689 Other abnormalities of gait and mobility: Secondary | ICD-10-CM | POA: Diagnosis not present

## 2022-10-24 DIAGNOSIS — M6281 Muscle weakness (generalized): Secondary | ICD-10-CM | POA: Diagnosis not present

## 2022-10-24 DIAGNOSIS — M79601 Pain in right arm: Secondary | ICD-10-CM | POA: Diagnosis not present

## 2022-10-24 DIAGNOSIS — R293 Abnormal posture: Secondary | ICD-10-CM | POA: Diagnosis not present

## 2022-10-27 DIAGNOSIS — M6281 Muscle weakness (generalized): Secondary | ICD-10-CM | POA: Diagnosis not present

## 2022-10-27 DIAGNOSIS — R2689 Other abnormalities of gait and mobility: Secondary | ICD-10-CM | POA: Diagnosis not present

## 2022-10-27 DIAGNOSIS — R293 Abnormal posture: Secondary | ICD-10-CM | POA: Diagnosis not present

## 2022-10-27 DIAGNOSIS — F03918 Unspecified dementia, unspecified severity, with other behavioral disturbance: Secondary | ICD-10-CM | POA: Diagnosis not present

## 2022-10-28 DIAGNOSIS — R2689 Other abnormalities of gait and mobility: Secondary | ICD-10-CM | POA: Diagnosis not present

## 2022-10-28 DIAGNOSIS — M6281 Muscle weakness (generalized): Secondary | ICD-10-CM | POA: Diagnosis not present

## 2022-10-28 DIAGNOSIS — F03918 Unspecified dementia, unspecified severity, with other behavioral disturbance: Secondary | ICD-10-CM | POA: Diagnosis not present

## 2022-10-28 DIAGNOSIS — R293 Abnormal posture: Secondary | ICD-10-CM | POA: Diagnosis not present

## 2022-10-29 DIAGNOSIS — R293 Abnormal posture: Secondary | ICD-10-CM | POA: Diagnosis not present

## 2022-10-29 DIAGNOSIS — M6281 Muscle weakness (generalized): Secondary | ICD-10-CM | POA: Diagnosis not present

## 2022-10-29 DIAGNOSIS — R2689 Other abnormalities of gait and mobility: Secondary | ICD-10-CM | POA: Diagnosis not present

## 2022-10-29 DIAGNOSIS — F03918 Unspecified dementia, unspecified severity, with other behavioral disturbance: Secondary | ICD-10-CM | POA: Diagnosis not present

## 2022-10-30 DIAGNOSIS — F03918 Unspecified dementia, unspecified severity, with other behavioral disturbance: Secondary | ICD-10-CM | POA: Diagnosis not present

## 2022-10-30 DIAGNOSIS — R293 Abnormal posture: Secondary | ICD-10-CM | POA: Diagnosis not present

## 2022-10-30 DIAGNOSIS — M6281 Muscle weakness (generalized): Secondary | ICD-10-CM | POA: Diagnosis not present

## 2022-10-30 DIAGNOSIS — R2689 Other abnormalities of gait and mobility: Secondary | ICD-10-CM | POA: Diagnosis not present

## 2022-10-31 DIAGNOSIS — F03918 Unspecified dementia, unspecified severity, with other behavioral disturbance: Secondary | ICD-10-CM | POA: Diagnosis not present

## 2022-10-31 DIAGNOSIS — R293 Abnormal posture: Secondary | ICD-10-CM | POA: Diagnosis not present

## 2022-10-31 DIAGNOSIS — R2689 Other abnormalities of gait and mobility: Secondary | ICD-10-CM | POA: Diagnosis not present

## 2022-10-31 DIAGNOSIS — M6281 Muscle weakness (generalized): Secondary | ICD-10-CM | POA: Diagnosis not present

## 2022-11-03 DIAGNOSIS — R2689 Other abnormalities of gait and mobility: Secondary | ICD-10-CM | POA: Diagnosis not present

## 2022-11-03 DIAGNOSIS — R293 Abnormal posture: Secondary | ICD-10-CM | POA: Diagnosis not present

## 2022-11-03 DIAGNOSIS — M6281 Muscle weakness (generalized): Secondary | ICD-10-CM | POA: Diagnosis not present

## 2022-11-03 DIAGNOSIS — F03918 Unspecified dementia, unspecified severity, with other behavioral disturbance: Secondary | ICD-10-CM | POA: Diagnosis not present

## 2022-11-04 DIAGNOSIS — M6281 Muscle weakness (generalized): Secondary | ICD-10-CM | POA: Diagnosis not present

## 2022-11-04 DIAGNOSIS — R2689 Other abnormalities of gait and mobility: Secondary | ICD-10-CM | POA: Diagnosis not present

## 2022-11-04 DIAGNOSIS — R293 Abnormal posture: Secondary | ICD-10-CM | POA: Diagnosis not present

## 2022-11-04 DIAGNOSIS — F03918 Unspecified dementia, unspecified severity, with other behavioral disturbance: Secondary | ICD-10-CM | POA: Diagnosis not present

## 2022-11-05 DIAGNOSIS — F03918 Unspecified dementia, unspecified severity, with other behavioral disturbance: Secondary | ICD-10-CM | POA: Diagnosis not present

## 2022-11-05 DIAGNOSIS — R293 Abnormal posture: Secondary | ICD-10-CM | POA: Diagnosis not present

## 2022-11-05 DIAGNOSIS — M6281 Muscle weakness (generalized): Secondary | ICD-10-CM | POA: Diagnosis not present

## 2022-11-05 DIAGNOSIS — R2689 Other abnormalities of gait and mobility: Secondary | ICD-10-CM | POA: Diagnosis not present

## 2022-11-06 DIAGNOSIS — R293 Abnormal posture: Secondary | ICD-10-CM | POA: Diagnosis not present

## 2022-11-06 DIAGNOSIS — M6281 Muscle weakness (generalized): Secondary | ICD-10-CM | POA: Diagnosis not present

## 2022-11-06 DIAGNOSIS — F03918 Unspecified dementia, unspecified severity, with other behavioral disturbance: Secondary | ICD-10-CM | POA: Diagnosis not present

## 2022-11-06 DIAGNOSIS — R2689 Other abnormalities of gait and mobility: Secondary | ICD-10-CM | POA: Diagnosis not present

## 2022-11-07 DIAGNOSIS — K219 Gastro-esophageal reflux disease without esophagitis: Secondary | ICD-10-CM | POA: Diagnosis not present

## 2022-11-07 DIAGNOSIS — I10 Essential (primary) hypertension: Secondary | ICD-10-CM | POA: Diagnosis not present

## 2022-11-07 DIAGNOSIS — M6281 Muscle weakness (generalized): Secondary | ICD-10-CM | POA: Diagnosis not present

## 2022-11-07 DIAGNOSIS — R293 Abnormal posture: Secondary | ICD-10-CM | POA: Diagnosis not present

## 2022-11-07 DIAGNOSIS — D649 Anemia, unspecified: Secondary | ICD-10-CM | POA: Diagnosis not present

## 2022-11-07 DIAGNOSIS — E119 Type 2 diabetes mellitus without complications: Secondary | ICD-10-CM | POA: Diagnosis not present

## 2022-11-07 DIAGNOSIS — M810 Age-related osteoporosis without current pathological fracture: Secondary | ICD-10-CM | POA: Diagnosis not present

## 2022-11-07 DIAGNOSIS — E039 Hypothyroidism, unspecified: Secondary | ICD-10-CM | POA: Diagnosis not present

## 2022-11-07 DIAGNOSIS — R2689 Other abnormalities of gait and mobility: Secondary | ICD-10-CM | POA: Diagnosis not present

## 2022-11-07 DIAGNOSIS — F03918 Unspecified dementia, unspecified severity, with other behavioral disturbance: Secondary | ICD-10-CM | POA: Diagnosis not present

## 2022-11-08 DIAGNOSIS — R2689 Other abnormalities of gait and mobility: Secondary | ICD-10-CM | POA: Diagnosis not present

## 2022-11-08 DIAGNOSIS — M6281 Muscle weakness (generalized): Secondary | ICD-10-CM | POA: Diagnosis not present

## 2022-11-08 DIAGNOSIS — F03918 Unspecified dementia, unspecified severity, with other behavioral disturbance: Secondary | ICD-10-CM | POA: Diagnosis not present

## 2022-11-08 DIAGNOSIS — R293 Abnormal posture: Secondary | ICD-10-CM | POA: Diagnosis not present

## 2022-11-10 DIAGNOSIS — R293 Abnormal posture: Secondary | ICD-10-CM | POA: Diagnosis not present

## 2022-11-10 DIAGNOSIS — R2689 Other abnormalities of gait and mobility: Secondary | ICD-10-CM | POA: Diagnosis not present

## 2022-11-10 DIAGNOSIS — F03918 Unspecified dementia, unspecified severity, with other behavioral disturbance: Secondary | ICD-10-CM | POA: Diagnosis not present

## 2022-11-10 DIAGNOSIS — M6281 Muscle weakness (generalized): Secondary | ICD-10-CM | POA: Diagnosis not present

## 2022-11-11 DIAGNOSIS — M6281 Muscle weakness (generalized): Secondary | ICD-10-CM | POA: Diagnosis not present

## 2022-11-11 DIAGNOSIS — R2689 Other abnormalities of gait and mobility: Secondary | ICD-10-CM | POA: Diagnosis not present

## 2022-11-11 DIAGNOSIS — F03918 Unspecified dementia, unspecified severity, with other behavioral disturbance: Secondary | ICD-10-CM | POA: Diagnosis not present

## 2022-11-11 DIAGNOSIS — R293 Abnormal posture: Secondary | ICD-10-CM | POA: Diagnosis not present

## 2022-11-12 DIAGNOSIS — F03918 Unspecified dementia, unspecified severity, with other behavioral disturbance: Secondary | ICD-10-CM | POA: Diagnosis not present

## 2022-11-12 DIAGNOSIS — R293 Abnormal posture: Secondary | ICD-10-CM | POA: Diagnosis not present

## 2022-11-12 DIAGNOSIS — M6281 Muscle weakness (generalized): Secondary | ICD-10-CM | POA: Diagnosis not present

## 2022-11-12 DIAGNOSIS — R2689 Other abnormalities of gait and mobility: Secondary | ICD-10-CM | POA: Diagnosis not present

## 2022-11-13 DIAGNOSIS — R293 Abnormal posture: Secondary | ICD-10-CM | POA: Diagnosis not present

## 2022-11-13 DIAGNOSIS — F03918 Unspecified dementia, unspecified severity, with other behavioral disturbance: Secondary | ICD-10-CM | POA: Diagnosis not present

## 2022-11-13 DIAGNOSIS — M6281 Muscle weakness (generalized): Secondary | ICD-10-CM | POA: Diagnosis not present

## 2022-11-13 DIAGNOSIS — I10 Essential (primary) hypertension: Secondary | ICD-10-CM | POA: Diagnosis not present

## 2022-11-13 DIAGNOSIS — R2689 Other abnormalities of gait and mobility: Secondary | ICD-10-CM | POA: Diagnosis not present

## 2022-11-14 DIAGNOSIS — R2689 Other abnormalities of gait and mobility: Secondary | ICD-10-CM | POA: Diagnosis not present

## 2022-11-14 DIAGNOSIS — F03918 Unspecified dementia, unspecified severity, with other behavioral disturbance: Secondary | ICD-10-CM | POA: Diagnosis not present

## 2022-11-14 DIAGNOSIS — R293 Abnormal posture: Secondary | ICD-10-CM | POA: Diagnosis not present

## 2022-11-14 DIAGNOSIS — M6281 Muscle weakness (generalized): Secondary | ICD-10-CM | POA: Diagnosis not present

## 2022-11-18 DIAGNOSIS — R2689 Other abnormalities of gait and mobility: Secondary | ICD-10-CM | POA: Diagnosis not present

## 2022-11-18 DIAGNOSIS — M6281 Muscle weakness (generalized): Secondary | ICD-10-CM | POA: Diagnosis not present

## 2022-11-18 DIAGNOSIS — F03918 Unspecified dementia, unspecified severity, with other behavioral disturbance: Secondary | ICD-10-CM | POA: Diagnosis not present

## 2022-11-18 DIAGNOSIS — R293 Abnormal posture: Secondary | ICD-10-CM | POA: Diagnosis not present

## 2022-11-19 DIAGNOSIS — U071 COVID-19: Secondary | ICD-10-CM | POA: Diagnosis not present

## 2022-11-19 DIAGNOSIS — F03918 Unspecified dementia, unspecified severity, with other behavioral disturbance: Secondary | ICD-10-CM | POA: Diagnosis not present

## 2022-11-19 DIAGNOSIS — R5381 Other malaise: Secondary | ICD-10-CM | POA: Diagnosis not present

## 2022-11-19 DIAGNOSIS — M6281 Muscle weakness (generalized): Secondary | ICD-10-CM | POA: Diagnosis not present

## 2022-11-19 DIAGNOSIS — R509 Fever, unspecified: Secondary | ICD-10-CM | POA: Diagnosis not present

## 2022-11-19 DIAGNOSIS — R2689 Other abnormalities of gait and mobility: Secondary | ICD-10-CM | POA: Diagnosis not present

## 2022-11-19 DIAGNOSIS — R293 Abnormal posture: Secondary | ICD-10-CM | POA: Diagnosis not present

## 2022-11-21 DIAGNOSIS — R2689 Other abnormalities of gait and mobility: Secondary | ICD-10-CM | POA: Diagnosis not present

## 2022-11-21 DIAGNOSIS — M6281 Muscle weakness (generalized): Secondary | ICD-10-CM | POA: Diagnosis not present

## 2022-11-21 DIAGNOSIS — R293 Abnormal posture: Secondary | ICD-10-CM | POA: Diagnosis not present

## 2022-11-21 DIAGNOSIS — F03918 Unspecified dementia, unspecified severity, with other behavioral disturbance: Secondary | ICD-10-CM | POA: Diagnosis not present

## 2022-11-24 DIAGNOSIS — M6281 Muscle weakness (generalized): Secondary | ICD-10-CM | POA: Diagnosis not present

## 2022-11-24 DIAGNOSIS — R2689 Other abnormalities of gait and mobility: Secondary | ICD-10-CM | POA: Diagnosis not present

## 2022-11-24 DIAGNOSIS — F03918 Unspecified dementia, unspecified severity, with other behavioral disturbance: Secondary | ICD-10-CM | POA: Diagnosis not present

## 2022-11-24 DIAGNOSIS — R293 Abnormal posture: Secondary | ICD-10-CM | POA: Diagnosis not present

## 2022-11-25 DIAGNOSIS — F03918 Unspecified dementia, unspecified severity, with other behavioral disturbance: Secondary | ICD-10-CM | POA: Diagnosis not present

## 2022-11-25 DIAGNOSIS — R293 Abnormal posture: Secondary | ICD-10-CM | POA: Diagnosis not present

## 2022-11-25 DIAGNOSIS — M6281 Muscle weakness (generalized): Secondary | ICD-10-CM | POA: Diagnosis not present

## 2022-11-25 DIAGNOSIS — R2689 Other abnormalities of gait and mobility: Secondary | ICD-10-CM | POA: Diagnosis not present

## 2022-11-26 DIAGNOSIS — R2689 Other abnormalities of gait and mobility: Secondary | ICD-10-CM | POA: Diagnosis not present

## 2022-11-26 DIAGNOSIS — F03918 Unspecified dementia, unspecified severity, with other behavioral disturbance: Secondary | ICD-10-CM | POA: Diagnosis not present

## 2022-11-26 DIAGNOSIS — R509 Fever, unspecified: Secondary | ICD-10-CM | POA: Diagnosis not present

## 2022-11-26 DIAGNOSIS — R293 Abnormal posture: Secondary | ICD-10-CM | POA: Diagnosis not present

## 2022-11-26 DIAGNOSIS — M6281 Muscle weakness (generalized): Secondary | ICD-10-CM | POA: Diagnosis not present

## 2022-11-26 DIAGNOSIS — U071 COVID-19: Secondary | ICD-10-CM | POA: Diagnosis not present

## 2022-11-27 DIAGNOSIS — R293 Abnormal posture: Secondary | ICD-10-CM | POA: Diagnosis not present

## 2022-11-27 DIAGNOSIS — F03918 Unspecified dementia, unspecified severity, with other behavioral disturbance: Secondary | ICD-10-CM | POA: Diagnosis not present

## 2022-11-27 DIAGNOSIS — M6281 Muscle weakness (generalized): Secondary | ICD-10-CM | POA: Diagnosis not present

## 2022-11-27 DIAGNOSIS — R2689 Other abnormalities of gait and mobility: Secondary | ICD-10-CM | POA: Diagnosis not present

## 2022-11-28 DIAGNOSIS — F03918 Unspecified dementia, unspecified severity, with other behavioral disturbance: Secondary | ICD-10-CM | POA: Diagnosis not present

## 2022-11-28 DIAGNOSIS — R293 Abnormal posture: Secondary | ICD-10-CM | POA: Diagnosis not present

## 2022-11-28 DIAGNOSIS — M6281 Muscle weakness (generalized): Secondary | ICD-10-CM | POA: Diagnosis not present

## 2022-11-28 DIAGNOSIS — R2689 Other abnormalities of gait and mobility: Secondary | ICD-10-CM | POA: Diagnosis not present

## 2022-12-01 DIAGNOSIS — E039 Hypothyroidism, unspecified: Secondary | ICD-10-CM | POA: Diagnosis not present

## 2022-12-01 DIAGNOSIS — R2689 Other abnormalities of gait and mobility: Secondary | ICD-10-CM | POA: Diagnosis not present

## 2022-12-01 DIAGNOSIS — E119 Type 2 diabetes mellitus without complications: Secondary | ICD-10-CM | POA: Diagnosis not present

## 2022-12-01 DIAGNOSIS — M6281 Muscle weakness (generalized): Secondary | ICD-10-CM | POA: Diagnosis not present

## 2022-12-01 DIAGNOSIS — R293 Abnormal posture: Secondary | ICD-10-CM | POA: Diagnosis not present

## 2022-12-01 DIAGNOSIS — F03918 Unspecified dementia, unspecified severity, with other behavioral disturbance: Secondary | ICD-10-CM | POA: Diagnosis not present

## 2022-12-01 DIAGNOSIS — M810 Age-related osteoporosis without current pathological fracture: Secondary | ICD-10-CM | POA: Diagnosis not present

## 2022-12-02 DIAGNOSIS — R293 Abnormal posture: Secondary | ICD-10-CM | POA: Diagnosis not present

## 2022-12-02 DIAGNOSIS — R2689 Other abnormalities of gait and mobility: Secondary | ICD-10-CM | POA: Diagnosis not present

## 2022-12-02 DIAGNOSIS — M6281 Muscle weakness (generalized): Secondary | ICD-10-CM | POA: Diagnosis not present

## 2022-12-02 DIAGNOSIS — F03918 Unspecified dementia, unspecified severity, with other behavioral disturbance: Secondary | ICD-10-CM | POA: Diagnosis not present

## 2022-12-03 DIAGNOSIS — R293 Abnormal posture: Secondary | ICD-10-CM | POA: Diagnosis not present

## 2022-12-03 DIAGNOSIS — F03918 Unspecified dementia, unspecified severity, with other behavioral disturbance: Secondary | ICD-10-CM | POA: Diagnosis not present

## 2022-12-03 DIAGNOSIS — E440 Moderate protein-calorie malnutrition: Secondary | ICD-10-CM | POA: Diagnosis not present

## 2022-12-03 DIAGNOSIS — R2689 Other abnormalities of gait and mobility: Secondary | ICD-10-CM | POA: Diagnosis not present

## 2022-12-03 DIAGNOSIS — R634 Abnormal weight loss: Secondary | ICD-10-CM | POA: Diagnosis not present

## 2022-12-03 DIAGNOSIS — M6281 Muscle weakness (generalized): Secondary | ICD-10-CM | POA: Diagnosis not present

## 2022-12-04 DIAGNOSIS — M6281 Muscle weakness (generalized): Secondary | ICD-10-CM | POA: Diagnosis not present

## 2022-12-04 DIAGNOSIS — R2689 Other abnormalities of gait and mobility: Secondary | ICD-10-CM | POA: Diagnosis not present

## 2022-12-04 DIAGNOSIS — R293 Abnormal posture: Secondary | ICD-10-CM | POA: Diagnosis not present

## 2022-12-04 DIAGNOSIS — F03918 Unspecified dementia, unspecified severity, with other behavioral disturbance: Secondary | ICD-10-CM | POA: Diagnosis not present

## 2022-12-05 DIAGNOSIS — F03918 Unspecified dementia, unspecified severity, with other behavioral disturbance: Secondary | ICD-10-CM | POA: Diagnosis not present

## 2022-12-05 DIAGNOSIS — R2689 Other abnormalities of gait and mobility: Secondary | ICD-10-CM | POA: Diagnosis not present

## 2022-12-05 DIAGNOSIS — M6281 Muscle weakness (generalized): Secondary | ICD-10-CM | POA: Diagnosis not present

## 2022-12-05 DIAGNOSIS — R293 Abnormal posture: Secondary | ICD-10-CM | POA: Diagnosis not present

## 2022-12-08 DIAGNOSIS — F03918 Unspecified dementia, unspecified severity, with other behavioral disturbance: Secondary | ICD-10-CM | POA: Diagnosis not present

## 2022-12-08 DIAGNOSIS — R2689 Other abnormalities of gait and mobility: Secondary | ICD-10-CM | POA: Diagnosis not present

## 2022-12-08 DIAGNOSIS — R293 Abnormal posture: Secondary | ICD-10-CM | POA: Diagnosis not present

## 2022-12-08 DIAGNOSIS — M6281 Muscle weakness (generalized): Secondary | ICD-10-CM | POA: Diagnosis not present

## 2022-12-09 DIAGNOSIS — R2689 Other abnormalities of gait and mobility: Secondary | ICD-10-CM | POA: Diagnosis not present

## 2022-12-09 DIAGNOSIS — R293 Abnormal posture: Secondary | ICD-10-CM | POA: Diagnosis not present

## 2022-12-09 DIAGNOSIS — M6281 Muscle weakness (generalized): Secondary | ICD-10-CM | POA: Diagnosis not present

## 2022-12-09 DIAGNOSIS — F03918 Unspecified dementia, unspecified severity, with other behavioral disturbance: Secondary | ICD-10-CM | POA: Diagnosis not present

## 2022-12-10 DIAGNOSIS — M6281 Muscle weakness (generalized): Secondary | ICD-10-CM | POA: Diagnosis not present

## 2022-12-10 DIAGNOSIS — R293 Abnormal posture: Secondary | ICD-10-CM | POA: Diagnosis not present

## 2022-12-10 DIAGNOSIS — F03918 Unspecified dementia, unspecified severity, with other behavioral disturbance: Secondary | ICD-10-CM | POA: Diagnosis not present

## 2022-12-10 DIAGNOSIS — R2689 Other abnormalities of gait and mobility: Secondary | ICD-10-CM | POA: Diagnosis not present

## 2022-12-11 DIAGNOSIS — R197 Diarrhea, unspecified: Secondary | ICD-10-CM | POA: Diagnosis not present

## 2022-12-11 DIAGNOSIS — M6281 Muscle weakness (generalized): Secondary | ICD-10-CM | POA: Diagnosis not present

## 2022-12-11 DIAGNOSIS — R2689 Other abnormalities of gait and mobility: Secondary | ICD-10-CM | POA: Diagnosis not present

## 2022-12-11 DIAGNOSIS — R293 Abnormal posture: Secondary | ICD-10-CM | POA: Diagnosis not present

## 2022-12-11 DIAGNOSIS — F03918 Unspecified dementia, unspecified severity, with other behavioral disturbance: Secondary | ICD-10-CM | POA: Diagnosis not present

## 2022-12-12 DIAGNOSIS — F03918 Unspecified dementia, unspecified severity, with other behavioral disturbance: Secondary | ICD-10-CM | POA: Diagnosis not present

## 2022-12-12 DIAGNOSIS — R2689 Other abnormalities of gait and mobility: Secondary | ICD-10-CM | POA: Diagnosis not present

## 2022-12-12 DIAGNOSIS — M6281 Muscle weakness (generalized): Secondary | ICD-10-CM | POA: Diagnosis not present

## 2022-12-12 DIAGNOSIS — R293 Abnormal posture: Secondary | ICD-10-CM | POA: Diagnosis not present

## 2022-12-15 DIAGNOSIS — F03918 Unspecified dementia, unspecified severity, with other behavioral disturbance: Secondary | ICD-10-CM | POA: Diagnosis not present

## 2022-12-15 DIAGNOSIS — M6281 Muscle weakness (generalized): Secondary | ICD-10-CM | POA: Diagnosis not present

## 2022-12-15 DIAGNOSIS — R293 Abnormal posture: Secondary | ICD-10-CM | POA: Diagnosis not present

## 2022-12-15 DIAGNOSIS — R2689 Other abnormalities of gait and mobility: Secondary | ICD-10-CM | POA: Diagnosis not present

## 2022-12-16 DIAGNOSIS — F03918 Unspecified dementia, unspecified severity, with other behavioral disturbance: Secondary | ICD-10-CM | POA: Diagnosis not present

## 2022-12-16 DIAGNOSIS — H04129 Dry eye syndrome of unspecified lacrimal gland: Secondary | ICD-10-CM | POA: Diagnosis not present

## 2022-12-16 DIAGNOSIS — M6281 Muscle weakness (generalized): Secondary | ICD-10-CM | POA: Diagnosis not present

## 2022-12-16 DIAGNOSIS — R293 Abnormal posture: Secondary | ICD-10-CM | POA: Diagnosis not present

## 2022-12-16 DIAGNOSIS — I10 Essential (primary) hypertension: Secondary | ICD-10-CM | POA: Diagnosis not present

## 2022-12-16 DIAGNOSIS — R2689 Other abnormalities of gait and mobility: Secondary | ICD-10-CM | POA: Diagnosis not present

## 2022-12-17 DIAGNOSIS — R293 Abnormal posture: Secondary | ICD-10-CM | POA: Diagnosis not present

## 2022-12-17 DIAGNOSIS — F03918 Unspecified dementia, unspecified severity, with other behavioral disturbance: Secondary | ICD-10-CM | POA: Diagnosis not present

## 2022-12-17 DIAGNOSIS — R2689 Other abnormalities of gait and mobility: Secondary | ICD-10-CM | POA: Diagnosis not present

## 2022-12-17 DIAGNOSIS — M6281 Muscle weakness (generalized): Secondary | ICD-10-CM | POA: Diagnosis not present

## 2022-12-18 DIAGNOSIS — M6281 Muscle weakness (generalized): Secondary | ICD-10-CM | POA: Diagnosis not present

## 2022-12-18 DIAGNOSIS — R293 Abnormal posture: Secondary | ICD-10-CM | POA: Diagnosis not present

## 2022-12-18 DIAGNOSIS — F03918 Unspecified dementia, unspecified severity, with other behavioral disturbance: Secondary | ICD-10-CM | POA: Diagnosis not present

## 2022-12-18 DIAGNOSIS — R2689 Other abnormalities of gait and mobility: Secondary | ICD-10-CM | POA: Diagnosis not present

## 2022-12-19 DIAGNOSIS — R2689 Other abnormalities of gait and mobility: Secondary | ICD-10-CM | POA: Diagnosis not present

## 2022-12-19 DIAGNOSIS — R293 Abnormal posture: Secondary | ICD-10-CM | POA: Diagnosis not present

## 2022-12-19 DIAGNOSIS — F03918 Unspecified dementia, unspecified severity, with other behavioral disturbance: Secondary | ICD-10-CM | POA: Diagnosis not present

## 2022-12-19 DIAGNOSIS — M6281 Muscle weakness (generalized): Secondary | ICD-10-CM | POA: Diagnosis not present

## 2022-12-22 DIAGNOSIS — M6281 Muscle weakness (generalized): Secondary | ICD-10-CM | POA: Diagnosis not present

## 2022-12-22 DIAGNOSIS — R2689 Other abnormalities of gait and mobility: Secondary | ICD-10-CM | POA: Diagnosis not present

## 2022-12-22 DIAGNOSIS — R293 Abnormal posture: Secondary | ICD-10-CM | POA: Diagnosis not present

## 2022-12-22 DIAGNOSIS — F03918 Unspecified dementia, unspecified severity, with other behavioral disturbance: Secondary | ICD-10-CM | POA: Diagnosis not present

## 2022-12-23 DIAGNOSIS — M6281 Muscle weakness (generalized): Secondary | ICD-10-CM | POA: Diagnosis not present

## 2022-12-23 DIAGNOSIS — R293 Abnormal posture: Secondary | ICD-10-CM | POA: Diagnosis not present

## 2022-12-23 DIAGNOSIS — R2689 Other abnormalities of gait and mobility: Secondary | ICD-10-CM | POA: Diagnosis not present

## 2022-12-23 DIAGNOSIS — F03918 Unspecified dementia, unspecified severity, with other behavioral disturbance: Secondary | ICD-10-CM | POA: Diagnosis not present

## 2022-12-24 DIAGNOSIS — F03918 Unspecified dementia, unspecified severity, with other behavioral disturbance: Secondary | ICD-10-CM | POA: Diagnosis not present

## 2022-12-24 DIAGNOSIS — R293 Abnormal posture: Secondary | ICD-10-CM | POA: Diagnosis not present

## 2022-12-24 DIAGNOSIS — M6281 Muscle weakness (generalized): Secondary | ICD-10-CM | POA: Diagnosis not present

## 2022-12-24 DIAGNOSIS — R2689 Other abnormalities of gait and mobility: Secondary | ICD-10-CM | POA: Diagnosis not present

## 2022-12-25 DIAGNOSIS — R293 Abnormal posture: Secondary | ICD-10-CM | POA: Diagnosis not present

## 2022-12-25 DIAGNOSIS — R2689 Other abnormalities of gait and mobility: Secondary | ICD-10-CM | POA: Diagnosis not present

## 2022-12-25 DIAGNOSIS — M6281 Muscle weakness (generalized): Secondary | ICD-10-CM | POA: Diagnosis not present

## 2022-12-25 DIAGNOSIS — F03918 Unspecified dementia, unspecified severity, with other behavioral disturbance: Secondary | ICD-10-CM | POA: Diagnosis not present

## 2022-12-26 DIAGNOSIS — F03918 Unspecified dementia, unspecified severity, with other behavioral disturbance: Secondary | ICD-10-CM | POA: Diagnosis not present

## 2022-12-26 DIAGNOSIS — R293 Abnormal posture: Secondary | ICD-10-CM | POA: Diagnosis not present

## 2022-12-26 DIAGNOSIS — M6281 Muscle weakness (generalized): Secondary | ICD-10-CM | POA: Diagnosis not present

## 2022-12-26 DIAGNOSIS — R2689 Other abnormalities of gait and mobility: Secondary | ICD-10-CM | POA: Diagnosis not present

## 2022-12-29 DIAGNOSIS — M6281 Muscle weakness (generalized): Secondary | ICD-10-CM | POA: Diagnosis not present

## 2022-12-29 DIAGNOSIS — R2689 Other abnormalities of gait and mobility: Secondary | ICD-10-CM | POA: Diagnosis not present

## 2022-12-29 DIAGNOSIS — F03918 Unspecified dementia, unspecified severity, with other behavioral disturbance: Secondary | ICD-10-CM | POA: Diagnosis not present

## 2022-12-29 DIAGNOSIS — R293 Abnormal posture: Secondary | ICD-10-CM | POA: Diagnosis not present

## 2022-12-30 DIAGNOSIS — R293 Abnormal posture: Secondary | ICD-10-CM | POA: Diagnosis not present

## 2022-12-30 DIAGNOSIS — R2689 Other abnormalities of gait and mobility: Secondary | ICD-10-CM | POA: Diagnosis not present

## 2022-12-30 DIAGNOSIS — M6281 Muscle weakness (generalized): Secondary | ICD-10-CM | POA: Diagnosis not present

## 2022-12-30 DIAGNOSIS — F03918 Unspecified dementia, unspecified severity, with other behavioral disturbance: Secondary | ICD-10-CM | POA: Diagnosis not present

## 2022-12-31 DIAGNOSIS — F03918 Unspecified dementia, unspecified severity, with other behavioral disturbance: Secondary | ICD-10-CM | POA: Diagnosis not present

## 2022-12-31 DIAGNOSIS — R293 Abnormal posture: Secondary | ICD-10-CM | POA: Diagnosis not present

## 2022-12-31 DIAGNOSIS — R2689 Other abnormalities of gait and mobility: Secondary | ICD-10-CM | POA: Diagnosis not present

## 2022-12-31 DIAGNOSIS — M6281 Muscle weakness (generalized): Secondary | ICD-10-CM | POA: Diagnosis not present

## 2023-01-01 DIAGNOSIS — R293 Abnormal posture: Secondary | ICD-10-CM | POA: Diagnosis not present

## 2023-01-01 DIAGNOSIS — F03918 Unspecified dementia, unspecified severity, with other behavioral disturbance: Secondary | ICD-10-CM | POA: Diagnosis not present

## 2023-01-01 DIAGNOSIS — R2689 Other abnormalities of gait and mobility: Secondary | ICD-10-CM | POA: Diagnosis not present

## 2023-01-01 DIAGNOSIS — M6281 Muscle weakness (generalized): Secondary | ICD-10-CM | POA: Diagnosis not present

## 2023-01-02 DIAGNOSIS — R2689 Other abnormalities of gait and mobility: Secondary | ICD-10-CM | POA: Diagnosis not present

## 2023-01-02 DIAGNOSIS — M6281 Muscle weakness (generalized): Secondary | ICD-10-CM | POA: Diagnosis not present

## 2023-01-02 DIAGNOSIS — F03918 Unspecified dementia, unspecified severity, with other behavioral disturbance: Secondary | ICD-10-CM | POA: Diagnosis not present

## 2023-01-02 DIAGNOSIS — R293 Abnormal posture: Secondary | ICD-10-CM | POA: Diagnosis not present

## 2023-01-05 DIAGNOSIS — E119 Type 2 diabetes mellitus without complications: Secondary | ICD-10-CM | POA: Diagnosis not present

## 2023-01-05 DIAGNOSIS — I10 Essential (primary) hypertension: Secondary | ICD-10-CM | POA: Diagnosis not present

## 2023-01-05 DIAGNOSIS — E785 Hyperlipidemia, unspecified: Secondary | ICD-10-CM | POA: Diagnosis not present

## 2023-01-05 DIAGNOSIS — R293 Abnormal posture: Secondary | ICD-10-CM | POA: Diagnosis not present

## 2023-01-05 DIAGNOSIS — M6281 Muscle weakness (generalized): Secondary | ICD-10-CM | POA: Diagnosis not present

## 2023-01-05 DIAGNOSIS — N390 Urinary tract infection, site not specified: Secondary | ICD-10-CM | POA: Diagnosis not present

## 2023-01-05 DIAGNOSIS — F03918 Unspecified dementia, unspecified severity, with other behavioral disturbance: Secondary | ICD-10-CM | POA: Diagnosis not present

## 2023-01-05 DIAGNOSIS — R2689 Other abnormalities of gait and mobility: Secondary | ICD-10-CM | POA: Diagnosis not present

## 2023-01-06 DIAGNOSIS — F03918 Unspecified dementia, unspecified severity, with other behavioral disturbance: Secondary | ICD-10-CM | POA: Diagnosis not present

## 2023-01-06 DIAGNOSIS — M6281 Muscle weakness (generalized): Secondary | ICD-10-CM | POA: Diagnosis not present

## 2023-01-06 DIAGNOSIS — R2689 Other abnormalities of gait and mobility: Secondary | ICD-10-CM | POA: Diagnosis not present

## 2023-01-06 DIAGNOSIS — R293 Abnormal posture: Secondary | ICD-10-CM | POA: Diagnosis not present

## 2023-01-07 DIAGNOSIS — F03918 Unspecified dementia, unspecified severity, with other behavioral disturbance: Secondary | ICD-10-CM | POA: Diagnosis not present

## 2023-01-07 DIAGNOSIS — M6281 Muscle weakness (generalized): Secondary | ICD-10-CM | POA: Diagnosis not present

## 2023-01-07 DIAGNOSIS — R293 Abnormal posture: Secondary | ICD-10-CM | POA: Diagnosis not present

## 2023-01-07 DIAGNOSIS — R2689 Other abnormalities of gait and mobility: Secondary | ICD-10-CM | POA: Diagnosis not present

## 2023-01-08 DIAGNOSIS — M6281 Muscle weakness (generalized): Secondary | ICD-10-CM | POA: Diagnosis not present

## 2023-01-08 DIAGNOSIS — R2689 Other abnormalities of gait and mobility: Secondary | ICD-10-CM | POA: Diagnosis not present

## 2023-01-08 DIAGNOSIS — R293 Abnormal posture: Secondary | ICD-10-CM | POA: Diagnosis not present

## 2023-01-08 DIAGNOSIS — F03918 Unspecified dementia, unspecified severity, with other behavioral disturbance: Secondary | ICD-10-CM | POA: Diagnosis not present

## 2023-01-09 DIAGNOSIS — F03918 Unspecified dementia, unspecified severity, with other behavioral disturbance: Secondary | ICD-10-CM | POA: Diagnosis not present

## 2023-01-09 DIAGNOSIS — R8290 Unspecified abnormal findings in urine: Secondary | ICD-10-CM | POA: Diagnosis not present

## 2023-01-09 DIAGNOSIS — M6281 Muscle weakness (generalized): Secondary | ICD-10-CM | POA: Diagnosis not present

## 2023-01-09 DIAGNOSIS — R293 Abnormal posture: Secondary | ICD-10-CM | POA: Diagnosis not present

## 2023-01-09 DIAGNOSIS — R300 Dysuria: Secondary | ICD-10-CM | POA: Diagnosis not present

## 2023-01-09 DIAGNOSIS — R2689 Other abnormalities of gait and mobility: Secondary | ICD-10-CM | POA: Diagnosis not present

## 2023-01-12 DIAGNOSIS — I10 Essential (primary) hypertension: Secondary | ICD-10-CM | POA: Diagnosis not present

## 2023-01-12 DIAGNOSIS — R293 Abnormal posture: Secondary | ICD-10-CM | POA: Diagnosis not present

## 2023-01-12 DIAGNOSIS — R2689 Other abnormalities of gait and mobility: Secondary | ICD-10-CM | POA: Diagnosis not present

## 2023-01-12 DIAGNOSIS — M6281 Muscle weakness (generalized): Secondary | ICD-10-CM | POA: Diagnosis not present

## 2023-01-12 DIAGNOSIS — F03918 Unspecified dementia, unspecified severity, with other behavioral disturbance: Secondary | ICD-10-CM | POA: Diagnosis not present

## 2023-01-13 DIAGNOSIS — F03918 Unspecified dementia, unspecified severity, with other behavioral disturbance: Secondary | ICD-10-CM | POA: Diagnosis not present

## 2023-01-13 DIAGNOSIS — R2689 Other abnormalities of gait and mobility: Secondary | ICD-10-CM | POA: Diagnosis not present

## 2023-01-13 DIAGNOSIS — R293 Abnormal posture: Secondary | ICD-10-CM | POA: Diagnosis not present

## 2023-01-13 DIAGNOSIS — M6281 Muscle weakness (generalized): Secondary | ICD-10-CM | POA: Diagnosis not present

## 2023-01-14 DIAGNOSIS — R2689 Other abnormalities of gait and mobility: Secondary | ICD-10-CM | POA: Diagnosis not present

## 2023-01-14 DIAGNOSIS — M6281 Muscle weakness (generalized): Secondary | ICD-10-CM | POA: Diagnosis not present

## 2023-01-14 DIAGNOSIS — R293 Abnormal posture: Secondary | ICD-10-CM | POA: Diagnosis not present

## 2023-01-14 DIAGNOSIS — F03918 Unspecified dementia, unspecified severity, with other behavioral disturbance: Secondary | ICD-10-CM | POA: Diagnosis not present

## 2023-01-15 DIAGNOSIS — F03918 Unspecified dementia, unspecified severity, with other behavioral disturbance: Secondary | ICD-10-CM | POA: Diagnosis not present

## 2023-01-15 DIAGNOSIS — R2689 Other abnormalities of gait and mobility: Secondary | ICD-10-CM | POA: Diagnosis not present

## 2023-01-15 DIAGNOSIS — M6281 Muscle weakness (generalized): Secondary | ICD-10-CM | POA: Diagnosis not present

## 2023-01-15 DIAGNOSIS — R293 Abnormal posture: Secondary | ICD-10-CM | POA: Diagnosis not present

## 2023-01-16 DIAGNOSIS — R293 Abnormal posture: Secondary | ICD-10-CM | POA: Diagnosis not present

## 2023-01-16 DIAGNOSIS — R2689 Other abnormalities of gait and mobility: Secondary | ICD-10-CM | POA: Diagnosis not present

## 2023-01-16 DIAGNOSIS — F03918 Unspecified dementia, unspecified severity, with other behavioral disturbance: Secondary | ICD-10-CM | POA: Diagnosis not present

## 2023-01-16 DIAGNOSIS — M6281 Muscle weakness (generalized): Secondary | ICD-10-CM | POA: Diagnosis not present

## 2023-01-19 DIAGNOSIS — F03918 Unspecified dementia, unspecified severity, with other behavioral disturbance: Secondary | ICD-10-CM | POA: Diagnosis not present

## 2023-01-19 DIAGNOSIS — N390 Urinary tract infection, site not specified: Secondary | ICD-10-CM | POA: Diagnosis not present

## 2023-01-19 DIAGNOSIS — M6281 Muscle weakness (generalized): Secondary | ICD-10-CM | POA: Diagnosis not present

## 2023-01-19 DIAGNOSIS — R2689 Other abnormalities of gait and mobility: Secondary | ICD-10-CM | POA: Diagnosis not present

## 2023-01-19 DIAGNOSIS — R293 Abnormal posture: Secondary | ICD-10-CM | POA: Diagnosis not present

## 2023-01-20 DIAGNOSIS — F03918 Unspecified dementia, unspecified severity, with other behavioral disturbance: Secondary | ICD-10-CM | POA: Diagnosis not present

## 2023-01-20 DIAGNOSIS — R2689 Other abnormalities of gait and mobility: Secondary | ICD-10-CM | POA: Diagnosis not present

## 2023-01-20 DIAGNOSIS — M6281 Muscle weakness (generalized): Secondary | ICD-10-CM | POA: Diagnosis not present

## 2023-01-20 DIAGNOSIS — R293 Abnormal posture: Secondary | ICD-10-CM | POA: Diagnosis not present

## 2023-01-21 DIAGNOSIS — R2689 Other abnormalities of gait and mobility: Secondary | ICD-10-CM | POA: Diagnosis not present

## 2023-01-21 DIAGNOSIS — F03918 Unspecified dementia, unspecified severity, with other behavioral disturbance: Secondary | ICD-10-CM | POA: Diagnosis not present

## 2023-01-21 DIAGNOSIS — R293 Abnormal posture: Secondary | ICD-10-CM | POA: Diagnosis not present

## 2023-01-21 DIAGNOSIS — M6281 Muscle weakness (generalized): Secondary | ICD-10-CM | POA: Diagnosis not present

## 2023-01-22 DIAGNOSIS — R293 Abnormal posture: Secondary | ICD-10-CM | POA: Diagnosis not present

## 2023-01-22 DIAGNOSIS — R2689 Other abnormalities of gait and mobility: Secondary | ICD-10-CM | POA: Diagnosis not present

## 2023-01-22 DIAGNOSIS — F03918 Unspecified dementia, unspecified severity, with other behavioral disturbance: Secondary | ICD-10-CM | POA: Diagnosis not present

## 2023-01-22 DIAGNOSIS — M6281 Muscle weakness (generalized): Secondary | ICD-10-CM | POA: Diagnosis not present

## 2023-01-23 DIAGNOSIS — N390 Urinary tract infection, site not specified: Secondary | ICD-10-CM | POA: Diagnosis not present

## 2023-01-23 DIAGNOSIS — M6281 Muscle weakness (generalized): Secondary | ICD-10-CM | POA: Diagnosis not present

## 2023-01-23 DIAGNOSIS — B3731 Acute candidiasis of vulva and vagina: Secondary | ICD-10-CM | POA: Diagnosis not present

## 2023-01-23 DIAGNOSIS — R293 Abnormal posture: Secondary | ICD-10-CM | POA: Diagnosis not present

## 2023-01-23 DIAGNOSIS — R2689 Other abnormalities of gait and mobility: Secondary | ICD-10-CM | POA: Diagnosis not present

## 2023-01-23 DIAGNOSIS — F03918 Unspecified dementia, unspecified severity, with other behavioral disturbance: Secondary | ICD-10-CM | POA: Diagnosis not present

## 2023-01-26 ENCOUNTER — Observation Stay (HOSPITAL_COMMUNITY): Payer: Medicare Other

## 2023-01-26 ENCOUNTER — Observation Stay (HOSPITAL_COMMUNITY)
Admission: EM | Admit: 2023-01-26 | Discharge: 2023-01-27 | Disposition: A | Discharge status: Skilled Nursing Facility | Payer: Medicare Other | Attending: Internal Medicine | Admitting: Internal Medicine

## 2023-01-26 ENCOUNTER — Emergency Department (HOSPITAL_COMMUNITY): Payer: Medicare Other

## 2023-01-26 ENCOUNTER — Encounter (HOSPITAL_COMMUNITY): Payer: Self-pay

## 2023-01-26 ENCOUNTER — Other Ambulatory Visit: Payer: Self-pay

## 2023-01-26 DIAGNOSIS — M6281 Muscle weakness (generalized): Secondary | ICD-10-CM | POA: Diagnosis not present

## 2023-01-26 DIAGNOSIS — G9341 Metabolic encephalopathy: Secondary | ICD-10-CM | POA: Diagnosis not present

## 2023-01-26 DIAGNOSIS — Z85038 Personal history of other malignant neoplasm of large intestine: Secondary | ICD-10-CM | POA: Diagnosis not present

## 2023-01-26 DIAGNOSIS — R293 Abnormal posture: Secondary | ICD-10-CM | POA: Diagnosis not present

## 2023-01-26 DIAGNOSIS — Z7984 Long term (current) use of oral hypoglycemic drugs: Secondary | ICD-10-CM | POA: Insufficient documentation

## 2023-01-26 DIAGNOSIS — A419 Sepsis, unspecified organism: Secondary | ICD-10-CM | POA: Diagnosis not present

## 2023-01-26 DIAGNOSIS — E119 Type 2 diabetes mellitus without complications: Secondary | ICD-10-CM | POA: Diagnosis not present

## 2023-01-26 DIAGNOSIS — E860 Dehydration: Secondary | ICD-10-CM

## 2023-01-26 DIAGNOSIS — Z87891 Personal history of nicotine dependence: Secondary | ICD-10-CM | POA: Insufficient documentation

## 2023-01-26 DIAGNOSIS — F0390 Unspecified dementia without behavioral disturbance: Secondary | ICD-10-CM | POA: Insufficient documentation

## 2023-01-26 DIAGNOSIS — R030 Elevated blood-pressure reading, without diagnosis of hypertension: Secondary | ICD-10-CM

## 2023-01-26 DIAGNOSIS — F03918 Unspecified dementia, unspecified severity, with other behavioral disturbance: Secondary | ICD-10-CM | POA: Diagnosis not present

## 2023-01-26 DIAGNOSIS — E039 Hypothyroidism, unspecified: Secondary | ICD-10-CM | POA: Insufficient documentation

## 2023-01-26 DIAGNOSIS — R2689 Other abnormalities of gait and mobility: Secondary | ICD-10-CM | POA: Diagnosis not present

## 2023-01-26 DIAGNOSIS — N390 Urinary tract infection, site not specified: Secondary | ICD-10-CM | POA: Diagnosis not present

## 2023-01-26 DIAGNOSIS — Z96652 Presence of left artificial knee joint: Secondary | ICD-10-CM | POA: Diagnosis not present

## 2023-01-26 DIAGNOSIS — R6889 Other general symptoms and signs: Secondary | ICD-10-CM | POA: Diagnosis not present

## 2023-01-26 DIAGNOSIS — R531 Weakness: Secondary | ICD-10-CM | POA: Diagnosis not present

## 2023-01-26 DIAGNOSIS — Z7982 Long term (current) use of aspirin: Secondary | ICD-10-CM | POA: Diagnosis not present

## 2023-01-26 DIAGNOSIS — N179 Acute kidney failure, unspecified: Secondary | ICD-10-CM | POA: Diagnosis not present

## 2023-01-26 DIAGNOSIS — Z79899 Other long term (current) drug therapy: Secondary | ICD-10-CM | POA: Insufficient documentation

## 2023-01-26 DIAGNOSIS — E1169 Type 2 diabetes mellitus with other specified complication: Secondary | ICD-10-CM | POA: Diagnosis not present

## 2023-01-26 DIAGNOSIS — E871 Hypo-osmolality and hyponatremia: Secondary | ICD-10-CM

## 2023-01-26 DIAGNOSIS — I10 Essential (primary) hypertension: Secondary | ICD-10-CM | POA: Diagnosis not present

## 2023-01-26 DIAGNOSIS — R4182 Altered mental status, unspecified: Secondary | ICD-10-CM | POA: Diagnosis present

## 2023-01-26 DIAGNOSIS — Z87440 Personal history of urinary (tract) infections: Secondary | ICD-10-CM

## 2023-01-26 DIAGNOSIS — Z743 Need for continuous supervision: Secondary | ICD-10-CM | POA: Diagnosis not present

## 2023-01-26 LAB — CBC WITH DIFFERENTIAL/PLATELET
Abs Immature Granulocytes: 0.04 10*3/uL (ref 0.00–0.07)
Basophils Absolute: 0 10*3/uL (ref 0.0–0.1)
Basophils Relative: 0 %
Eosinophils Absolute: 0.5 10*3/uL (ref 0.0–0.5)
Eosinophils Relative: 9 %
HCT: 35.5 % — ABNORMAL LOW (ref 36.0–46.0)
Hemoglobin: 11.5 g/dL — ABNORMAL LOW (ref 12.0–15.0)
Immature Granulocytes: 1 %
Lymphocytes Relative: 20 %
Lymphs Abs: 1.1 10*3/uL (ref 0.7–4.0)
MCH: 28.8 pg (ref 26.0–34.0)
MCHC: 32.4 g/dL (ref 30.0–36.0)
MCV: 88.8 fL (ref 80.0–100.0)
Monocytes Absolute: 0.7 10*3/uL (ref 0.1–1.0)
Monocytes Relative: 13 %
Neutro Abs: 3.1 10*3/uL (ref 1.7–7.7)
Neutrophils Relative %: 57 %
Platelets: 213 10*3/uL (ref 150–400)
RBC: 4 MIL/uL (ref 3.87–5.11)
RDW: 14.6 % (ref 11.5–15.5)
WBC: 5.4 10*3/uL (ref 4.0–10.5)
nRBC: 0 % (ref 0.0–0.2)

## 2023-01-26 LAB — URINALYSIS, ROUTINE W REFLEX MICROSCOPIC
Bacteria, UA: NONE SEEN
Bilirubin Urine: NEGATIVE
Glucose, UA: NEGATIVE mg/dL
Hgb urine dipstick: NEGATIVE
Ketones, ur: NEGATIVE mg/dL
Nitrite: NEGATIVE
Protein, ur: NEGATIVE mg/dL
Specific Gravity, Urine: 1.012 (ref 1.005–1.030)
pH: 6 (ref 5.0–8.0)

## 2023-01-26 LAB — COMPREHENSIVE METABOLIC PANEL
ALT: 16 U/L (ref 0–44)
AST: 23 U/L (ref 15–41)
Albumin: 2.8 g/dL — ABNORMAL LOW (ref 3.5–5.0)
Alkaline Phosphatase: 58 U/L (ref 38–126)
Anion gap: 5 (ref 5–15)
BUN: 13 mg/dL (ref 8–23)
CO2: 25 mmol/L (ref 22–32)
Calcium: 7.7 mg/dL — ABNORMAL LOW (ref 8.9–10.3)
Chloride: 98 mmol/L (ref 98–111)
Creatinine, Ser: 1.37 mg/dL — ABNORMAL HIGH (ref 0.44–1.00)
GFR, Estimated: 38 mL/min — ABNORMAL LOW (ref >60)
Glucose, Bld: 89 mg/dL (ref 70–99)
Potassium: 3.9 mmol/L (ref 3.5–5.1)
Sodium: 128 mmol/L — ABNORMAL LOW (ref 135–145)
Total Bilirubin: 0.2 mg/dL — ABNORMAL LOW (ref 0.3–1.2)
Total Protein: 6.2 g/dL — ABNORMAL LOW (ref 6.5–8.1)

## 2023-01-26 LAB — LACTIC ACID, PLASMA: Lactic Acid, Venous: 1.9 mmol/L (ref 0.5–1.9)

## 2023-01-26 LAB — PROTIME-INR
INR: 1.1 (ref 0.8–1.2)
Prothrombin Time: 13.7 seconds (ref 11.4–15.2)

## 2023-01-26 LAB — GLUCOSE, CAPILLARY: Glucose-Capillary: 108 mg/dL — ABNORMAL HIGH (ref 70–99)

## 2023-01-26 MED ORDER — PANTOPRAZOLE SODIUM 40 MG PO TBEC
40.0000 mg | DELAYED_RELEASE_TABLET | Freq: Every day | ORAL | Status: DC
  Administered 2023-01-27: 40 mg via ORAL
  Filled 2023-01-26: qty 1

## 2023-01-26 MED ORDER — ACETAMINOPHEN 325 MG PO TABS: 650.0000 mg | ORAL_TABLET | Freq: Four times a day (QID) | ORAL | Status: DC | PRN

## 2023-01-26 MED ORDER — POLYETHYLENE GLYCOL 3350 17 G PO PACK: 17.0000 g | PACK | Freq: Every day | ORAL | Status: DC | PRN

## 2023-01-26 MED ORDER — ONDANSETRON HCL 4 MG PO TABS: 4.0000 mg | ORAL_TABLET | Freq: Four times a day (QID) | ORAL | Status: DC | PRN

## 2023-01-26 MED ORDER — DONEPEZIL HCL 5 MG PO TABS
10.0000 mg | ORAL_TABLET | Freq: Every day | ORAL | Status: DC
  Administered 2023-01-27: 10 mg via ORAL
  Filled 2023-01-26: qty 2

## 2023-01-26 MED ORDER — SODIUM CHLORIDE 0.9 % IV SOLN
1.0000 g | INTRAVENOUS | Status: DC
  Administered 2023-01-27: 1 g via INTRAVENOUS
  Filled 2023-01-26: qty 10

## 2023-01-26 MED ORDER — ONDANSETRON HCL 4 MG/2ML IJ SOLN: 4.0000 mg | Freq: Four times a day (QID) | INTRAMUSCULAR | Status: DC | PRN

## 2023-01-26 MED ORDER — SODIUM CHLORIDE 0.9 % IV SOLN
1.0000 g | Freq: Once | INTRAVENOUS | Status: AC
  Administered 2023-01-26: 1 g via INTRAVENOUS
  Filled 2023-01-26: qty 10

## 2023-01-26 MED ORDER — ACETAMINOPHEN 650 MG RE SUPP: 650.0000 mg | Freq: Four times a day (QID) | RECTAL | Status: DC | PRN

## 2023-01-26 MED ORDER — SULFAMETHOXAZOLE-TRIMETHOPRIM 400-80 MG PO TABS: 2.0000 | ORAL_TABLET | Freq: Two times a day (BID) | ORAL | Status: DC

## 2023-01-26 MED ORDER — HEPARIN SODIUM (PORCINE) 5000 UNIT/ML IJ SOLN
5000.0000 [IU] | Freq: Three times a day (TID) | INTRAMUSCULAR | Status: DC
  Administered 2023-01-26 – 2023-01-27 (×3): 5000 [IU] via SUBCUTANEOUS
  Filled 2023-01-26 (×3): qty 1

## 2023-01-26 MED ORDER — LACTATED RINGERS IV BOLUS
1000.0000 mL | Freq: Once | INTRAVENOUS | Status: AC
  Administered 2023-01-26: 900 mL via INTRAVENOUS

## 2023-01-26 MED ORDER — SODIUM CHLORIDE 0.9 % IV BOLUS
1000.0000 mL | Freq: Once | INTRAVENOUS | Status: AC
  Administered 2023-01-26: 1000 mL via INTRAVENOUS

## 2023-01-26 MED ORDER — INSULIN ASPART 100 UNIT/ML IJ SOLN
0.0000 [IU] | Freq: Three times a day (TID) | INTRAMUSCULAR | Status: DC
  Administered 2023-01-27: 1 [IU] via SUBCUTANEOUS

## 2023-01-26 MED ORDER — LEVOTHYROXINE SODIUM 75 MCG PO TABS
75.0000 ug | ORAL_TABLET | Freq: Every day | ORAL | Status: DC
  Administered 2023-01-27: 75 ug via ORAL
  Filled 2023-01-26: qty 1

## 2023-01-26 MED ORDER — SODIUM CHLORIDE 0.9 % IV SOLN: INTRAVENOUS | Status: AC

## 2023-01-26 NOTE — ED Triage Notes (Signed)
EMS called out to [facility] stating that family wanted patient transferred to r/o sepsis and to make sure her UTI was getting better. Per EMS pt was seen on Friday 01/23/23 and rx bactrim and family states pt is becoming more AMS. Pt has hx of dementia and is oriented to self at baseline. EMS reports initial BP 88/58 manually and bolus of 50 mL NS started, BP increased to 100/54. Pt denies pain.

## 2023-01-26 NOTE — Assessment & Plan Note (Addendum)
-  Continue to follow CBGs/A1c as an outpatient -Most recent A1c 6.8 -Resume home dose of hypoglycemic agent -Continue to maintain adequate hydration and follow modified carbohydrate diet.

## 2023-01-26 NOTE — Assessment & Plan Note (Signed)
At baseline wheelchair-bound, rarely answers questions, nursing home resident, can recognize family but makes mistakes with names. -Resume donepezil,

## 2023-01-26 NOTE — Assessment & Plan Note (Addendum)
Hypotensive, systolic down to [id number]. -Hold telmisartan, Lasix, metoprolol for now

## 2023-01-26 NOTE — Discharge Instructions (Signed)
It was our pleasure to provide your ER care today - we hope that you feel better.  Drink plenty of fluids/stay well hydrated. Complete the course of your antibiotic.   Follow up closely with primary care doctor in the next 2-3 days.  Also have labs/sodium level/kidney function rechecked then (as sodium level low, and kidney function mildly elevated today).  Return to ER if worse, new symptoms, high fevers, chest pain, trouble breathing, abdominal pain, persistent vomiting, weak/fainting, or other emergency concern.

## 2023-01-26 NOTE — H&P (Signed)
History and Physical    Glenda Nolan [id number] DOB: [birth year] DOA: 01/26/2023  PCP: Celene Squibb, MD   Patient coming from: [facility]  I have personally briefly reviewed patient's old medical records in [place]  Chief Complaint: AMS  HPI: Glenda Nolan is a 88 y.o. female with medical history significant for hypertension, diabetes mellitus, dementia.  Patient was brought to the ED via EMS with reports of altered mental status.  She was diagnosed with a UTI and started on Bactrim 3/1, she was to complete a 10-day course.  But today, patient was very drowsy, hard to arouse, not following directions.  Family reports off-and-on diarrhea over the past few weeks, but this has resolved. At baseline, patient has significant dementia, often times she is able to recognize family, but makes mistakes with their names, she is rarely able to answer questions appropriately, ambulates with wheelchair.  At the time of my evaluation, patient was initially lethargic, very drowsy, later woke up, stated she felt cold, responded to family, but not answering questions.  ED Course: Hypotensive down to 87/52, improved with IV fluid bolus.  Tmax 98.4.  Heart rate 50s to 60s.  Respiratory rate 18-21.  O2 sats greater than 90% on room air. UA with small leukocytes.  EKG without significant changes.  Creatinine mildly elevated 1.37.  Sodium 128.  Lactic acid 1.9.  1 g Rocephin given, 2 L bolus given. Hospitalist to admit for generalized weakness, AKI, AMS, and hypotension.  Review of Systems: Unable to assess due to baseline dementia  Past Medical History:  Diagnosis Date   Anemia    Arthritis    Cataracts, bilateral    pending surgery May 2013   Colon cancer [facility]) 1994   Stage IIB   Diabetes ([place])    Diverticulosis    GERD (gastroesophageal reflux disease)    HTN (hypertension)    Hyperlipidemia    Hypothyroidism    Migraines    occasional migraines   Osteoporosis     Past Surgical  History:  Procedure Laterality Date   CATARACT EXTRACTION W/PHACO Left 08/01/2013   Procedure: CATARACT EXTRACTION PHACO AND INTRAOCULAR LENS PLACEMENT ([place]);  Surgeon: Tonny Branch, MD;  Location: [facility];  Service: Ophthalmology;  Laterality: Left;  CDE:11.47   CATARACT EXTRACTION W/PHACO Right 08/15/2013   Procedure: CATARACT EXTRACTION PHACO AND INTRAOCULAR LENS PLACEMENT ([facility]);  Surgeon: Tonny Branch, MD;  Location: [facility];  Service: Ophthalmology;  Laterality: Right;  CDE:  16.81   COLON SURGERY  1994   Right   COLONOSCOPY  04/01/2012   Rourk-Colonic ulcers at the anastomosis likely NSAID related, although ischemia is not excluded, status post biopsy, left-sided diverticula.  Internal hemorrhoids and anal papilla   COLONOSCOPY W/ BIOPSIES  05/04/09   Dr. Madolyn Frieze papilla, diverticulosis   DILATION AND CURETTAGE OF UTERUS     ENDOSCOPIC RETROGRADE CHOLANGIOPANCREATOGRAPHY (ERCP) WITH PROPOFOL N/A 07/30/2022   Procedure: ENDOSCOPIC RETROGRADE CHOLANGIOPANCREATOGRAPHY (ERCP) WITH PROPOFOL;  Surgeon: Ladene Artist, MD;  Location: [place];  Service: Gastroenterology;  Laterality: N/A;   ESOPHAGOGASTRODUODENOSCOPY  04/01/2012   [place]k-Normal esophagus, small hiatal hernia, deformity, scarring friability of the antrum/prepyloric mucosa suggestive of prior peptic ulcer disease, normal D1 and D2.   HEMORRHOID SURGERY     knee arthrocopy(Right)     REMOVAL OF STONES  07/30/2022   Procedure: REMOVAL OF STONES;  Surgeon: Ladene Artist, MD;  Location: [place];  Service: Gastroenterology;;   Joan Mayans  07/30/2022   Procedure:  SPHINCTEROTOMY;  Surgeon: Ladene Artist, MD;  Location: [facility] ENDOSCOPY;  Service: Gastroenterology;;   TOTAL KNEE ARTHROPLASTY Left 03/18/2017   Procedure: TOTAL KNEE ARTHROPLASTY;  Surgeon: Carole Civil, MD;  Location: [facility];  Service: Orthopedics;  Laterality: Left;     reports that she quit smoking about 30 years ago. Her smoking use included cigarettes. She  has a 5.00 pack-year smoking history. She quit smokeless tobacco use about 30 years ago. She reports current alcohol use. She reports that she does not use drugs.  No Known Allergies  Family History  Problem Relation Age of Onset   Leukemia Mother 28   Hypertension Mother    Heart failure Father 23   Coronary artery disease Sister        x2   Stroke Son    Prior to Admission medications   Medication Sig Start Date End Date Taking? Authorizing Provider  acetaminophen (TYLENOL) 325 MG tablet Take 325 mg by mouth daily after lunch.     [provider]  alendronate (FOSAMAX) 70 MG tablet Take 70 mg by mouth every Monday.  11/21/16   [provider]  aspirin EC 325 MG EC tablet Take 1 tablet (325 mg total) by mouth daily with breakfast. 03/21/17   Carole Civil, MD  cholecalciferol (VITAMIN D) 1000 UNITS tablet Take 1,000 Units by mouth daily after lunch.     [provider]  docusate sodium (COLACE) 100 MG capsule Take 1 capsule (100 mg total) by mouth 2 (two) times daily. 03/20/17   Carole Civil, MD  donepezil (ARICEPT) 5 MG tablet Take 10 mg by mouth at bedtime. 02/09/20   [provider]  furosemide (LASIX) 20 MG tablet Take 40 mg by mouth daily.  04/06/19   [provider]  levothyroxine (SYNTHROID) 75 MCG tablet Take 75 mcg by mouth every morning. 02/11/21   [provider]  metFORMIN (GLUCOPHAGE) 500 MG tablet Take 1 tablet by mouth daily. 04/10/21   [provider]  metoprolol tartrate (LOPRESSOR) 100 MG tablet TAKE (1) TABLET BY MOUTHDAT BREAKFAST THEN TAKE (1) TABLET BY MOUTH AT BEDTIME FOR HEART RATE. 11/29/19   [provider]  Multiple Vitamins-Minerals (PX SENIOR VITAMIN PO) Take 1 tablet by mouth daily after lunch.     [provider]  omeprazole (PRILOSEC) 20 MG capsule Take 20 mg by mouth daily after breakfast. (0800)    [provider]  potassium chloride (MICRO-K) 10 MEQ CR capsule  Take 10 mEq by mouth daily. 04/10/21   [provider]  raloxifene (EVISTA) 60 MG tablet Take 60 mg by mouth daily after breakfast. (0800)    [provider]  simvastatin (ZOCOR) 40 MG tablet Take 40 mg by mouth at bedtime.     [provider]  telmisartan (MICARDIS) 80 MG tablet Take 80 mg by mouth daily.  12/12/17   [provider]    Physical Exam: Exam limited by altered mental status. Vitals:   01/26/23 1430 01/26/23 1500 01/26/23 1530 01/26/23 1600  BP: (!) 89/59 (!) 87/52 (!) 105/52 (!) 97/47  Pulse: 66 (!) 59 61 (!) 58  Resp: (!) '21 19 19 20  '$ Temp:      TempSrc:      SpO2: 90% 94% 94% 97%  Weight:      Height:        Constitutional: NAD, calm, comfortable Vitals:   01/26/23 1430 01/26/23 1500 01/26/23 1530 01/26/23 1600  BP: (!) 89/59 (!) 87/52 Marland Kitchen)  105/52 (!) 97/47  Pulse: 66 (!) 59 61 (!) 58  Resp: (!) '21 19 19 20  '$ Temp:      TempSrc:      SpO2: 90% 94% 94% 97%  Weight:      Height:       Eyes: PERRL, lids and conjunctivae normal ENMT: Mucous membranes are mildly dry   Neck: normal, supple, no masses, no thyromegaly Respiratory: clear to auscultation bilaterally, no wheezing, no crackles. Normal respiratory effort. No accessory muscle use.  Cardiovascular: Regular rate and rhythm, no murmurs / rubs / gallops. Trace extremity edema.  Abdomen: no tenderness, no masses palpated. No hepatosplenomegaly. Bowel sounds positive.  Musculoskeletal: no clubbing / cyanosis. No joint deformity upper and lower extremities.  Skin: no rashes, lesions, ulcers. No induration Neurologic: Moving all extremities spontaneously when awake and to stimulation, no facial asymmetry. Psychiatric: Lethargic, very drowsy initially, later awakens, not answering questions.  Labs on Admission: I have personally reviewed following labs and imaging studies  CBC: Recent Labs  Lab 01/26/23 1349  WBC 5.4  NEUTROABS 3.1  HGB 11.5*  HCT 35.5*  MCV 88.8  PLT 123456    Basic Metabolic Panel: Recent Labs  Lab 01/26/23 1349  NA 128*  K 3.9  CL 98  CO2 25  GLUCOSE 89  BUN 13  CREATININE 1.37*  CALCIUM 7.7*   GFR: Estimated Creatinine Clearance: 28.6 mL/min (A) (by C-G formula based on SCr of 1.37 mg/dL (H)). Liver Function Tests: Recent Labs  Lab 01/26/23 1349  AST 23  ALT 16  ALKPHOS 58  BILITOT 0.2*  PROT 6.2*  ALBUMIN 2.8*   Coagulation Profile: Recent Labs  Lab 01/26/23 1349  INR 1.1   Urine analysis:    Component Value Date/Time   COLORURINE YELLOW 01/26/2023 1358   APPEARANCEUR CLEAR 01/26/2023 1358   LABSPEC 1.012 01/26/2023 1358   PHURINE 6.0 01/26/2023 1358   GLUCOSEU NEGATIVE 01/26/2023 1358   HGBUR NEGATIVE 01/26/2023 [place] 01/26/2023 1358   KETONESUR NEGATIVE 01/26/2023 1358   PROTEINUR NEGATIVE 01/26/2023 1358   UROBILINOGEN 0.2 10/20/2010 1337   NITRITE NEGATIVE 01/26/2023 1358   LEUKOCYTESUR SMALL (A) 01/26/2023 1358    Radiological Exams on Admission: DG Chest Port 1 View  Result Date: 01/26/2023 CLINICAL DATA:  Sepsis. EXAM: PORTABLE CHEST 1 VIEW COMPARISON:  Chest x-ray dated September 17, 2022. FINDINGS: The heart size and mediastinal contours are within normal limits. Normal pulmonary vascularity. No focal consolidation, pleural effusion, or pneumothorax. No acute osseous abnormality. IMPRESSION: No active disease. Electronically Signed   By: Titus Dubin M.D.   On: 01/26/2023 14:32    EKG: Independently reviewed.  Sinus rhythm, rate 62.  QTc 459.  No significant change from prior.  Assessment/Plan Principal Problem:   Acute metabolic encephalopathy Active Problems:   AKI (acute kidney injury) ([place])   Hyponatremia   Essential hypertension   Dementia (HCC)   Diabetes ([place])  Assessment and Plan: * Acute metabolic encephalopathy Lethargy, weakness, not responding to family, now more awake.  Baseline dementia. AMS- Likely from dehydration-poor oral intake, recent bout of  diarrhea, per med list she is also on Lasix.  Also likely recovering from UTI.  Does not appear urine cultures were obtained at nursing home.  No recent urine cultures on file.  She is afebrile without leukocytosis, rules out for sepsis.  Lactic acid 1.9. -Obtain head CT. -2 Liter bolus given, continue N/s 75cc/hr x 20hrs -1 g Rocephin given in ED, started  on Bactrim 3/1 will continue course -Follow-up blood cultures ordered in ED  Hyponatremia Sodium 128, baseline mid to high 130s.  Likely from poor oral intake, recent bout of diarrhea.  Per med list she is on Lasix. - hydrate with n/s  AKI (acute kidney injury) ([place])  Cr- 1.37 baseline 0.8-1.  Likely from dehydration, as evidenced by hypotension.  On telmisartan -Hydrate  Diabetes ([place]) - SSI- [place] - HgbA1c -Hold metformin  Dementia ([place]) At baseline wheelchair-bound, rarely answers questions, nursing home resident, can recognize family but makes mistakes with names. -Resume donepezil,  Essential hypertension Hypotensive, systolic down to [id number]. -Hold telmisartan, Lasix, metoprolol for now   DVT prophylaxis: Heparin Code Status: DNR-family with daughter Baker Janus at bedside. Family Communication: Daughter Baker Janus is Tax adviser, daughter Caren Griffins also present. Disposition Plan:  ~ 1-2 days Consults called: None Admission status:  Obs tele  Author: Bethena Roys, MD 01/26/2023   6.04 PM

## 2023-01-26 NOTE — ED Notes (Signed)
Dr. Ashok Cordia notified of possible sepsis pt.

## 2023-01-26 NOTE — ED Provider Notes (Addendum)
[facility] Provider Note   CSN: [id number] Arrival date & time: 01/26/23  1320     History  Chief Complaint  Patient presents with   Altered Mental Status    Glenda Nolan is a 88 y.o. female.  Patient from SNF, recent dx uti, pt appeared generally weak, with some decreased po intake, so family wanted checked. Bp is in 90s on arrival, which family indicates is relatively low for pt. Pt limited historian - level 5 caveat. At baseline is mainly in bed, but does get up with assistance/walker, transfers, etc. No report of recent trauma or fall. Pt denies specific c/o. No headache. No chest pain or sob. No cough. No abd pain or nvd. No dysuria.   The history is provided by the patient, a relative, medical records and the EMS personnel. The history is limited by the condition of the patient.  Altered Mental Status Associated symptoms: no abdominal pain, no fever, no headaches, no rash and no vomiting        Home Medications Prior to Admission medications   Medication Sig Start Date End Date Taking? Authorizing Provider  acetaminophen (TYLENOL) 325 MG tablet Take 325 mg by mouth daily after lunch.     [provider]  alendronate (FOSAMAX) 70 MG tablet Take 70 mg by mouth every Monday.  11/21/16   [provider]  aspirin EC 325 MG EC tablet Take 1 tablet (325 mg total) by mouth daily with breakfast. 03/21/17   Carole Civil, MD  cholecalciferol (VITAMIN D) 1000 UNITS tablet Take 1,000 Units by mouth daily after lunch.     [provider]  docusate sodium (COLACE) 100 MG capsule Take 1 capsule (100 mg total) by mouth 2 (two) times daily. 03/20/17   Carole Civil, MD  donepezil (ARICEPT) 5 MG tablet Take 10 mg by mouth at bedtime. 02/09/20   [provider]  furosemide (LASIX) 20 MG tablet Take 40 mg by mouth daily.  04/06/19   [provider]  levothyroxine (SYNTHROID) 75 MCG tablet Take 75  mcg by mouth every morning. 02/11/21   [provider]  metFORMIN (GLUCOPHAGE) 500 MG tablet Take 1 tablet by mouth daily. 04/10/21   [provider]  metoprolol tartrate (LOPRESSOR) 100 MG tablet TAKE (1) TABLET BY MOUTHDAT BREAKFAST THEN TAKE (1) TABLET BY MOUTH AT BEDTIME FOR HEART RATE. 11/29/19   [provider]  Multiple Vitamins-Minerals (PX SENIOR VITAMIN PO) Take 1 tablet by mouth daily after lunch.     [provider]  omeprazole (PRILOSEC) 20 MG capsule Take 20 mg by mouth daily after breakfast. (0800)    [provider]  potassium chloride (MICRO-K) 10 MEQ CR capsule Take 10 mEq by mouth daily. 04/10/21   [provider]  raloxifene (EVISTA) 60 MG tablet Take 60 mg by mouth daily after breakfast. (0800)    [provider]  simvastatin (ZOCOR) 40 MG tablet Take 40 mg by mouth at bedtime.     [provider]  telmisartan (MICARDIS) 80 MG tablet Take 80 mg by mouth daily.  12/12/17   [provider]      Allergies    Patient has no known allergies.    Review of Systems   Review of Systems  Constitutional:  Negative for chills and fever.  HENT:  Negative for sore throat.   Eyes:  Negative for redness.  Respiratory:  Negative for cough and shortness of breath.  Cardiovascular:  Negative for chest pain.  Gastrointestinal:  Negative for abdominal pain and vomiting.  Genitourinary:  Negative for dysuria and flank pain.  Musculoskeletal:  Negative for back pain and neck pain.  Skin:  Negative for rash.  Neurological:  Negative for headaches.    Physical Exam Updated Vital Signs BP (!) 87/52   Pulse (!) 59   Temp 98.4 F (36.9 C) (Oral)   Resp 19   Ht 1.626 m ('5\' 4"'$ )   Wt 71.4 kg   SpO2 94%   BMI 27.02 kg/m  Physical Exam Vitals and nursing note reviewed.  Constitutional:      Appearance: Normal appearance. She is well-developed.  HENT:     Head: Atraumatic.     Nose: Nose normal.      Mouth/Throat:     Mouth: Mucous membranes are moist.  Eyes:     General: No scleral icterus.    Conjunctiva/sclera: Conjunctivae normal.     Pupils: Pupils are equal, round, and reactive to light.  Neck:     Vascular: No carotid bruit.     Trachea: No tracheal deviation.     Comments: No stiffness or rigidity Cardiovascular:     Rate and Rhythm: Normal rate and regular rhythm.     Pulses: Normal pulses.     Heart sounds: Normal heart sounds. No murmur heard.    No friction rub. No gallop.  Pulmonary:     Effort: Pulmonary effort is normal. No respiratory distress.     Breath sounds: Normal breath sounds.  Abdominal:     General: Bowel sounds are normal. There is no distension.     Palpations: Abdomen is soft. There is no mass.     Tenderness: There is no abdominal tenderness.  Genitourinary:    Comments: No cva tenderness.  Musculoskeletal:        General: No swelling or tenderness.     Cervical back: Normal range of motion and neck supple. No rigidity or tenderness. No muscular tenderness.     Right lower leg: No edema.     Left lower leg: No edema.  Skin:    General: Skin is warm and dry.     Findings: No rash.  Neurological:     Mental Status: She is alert.     Comments: Alert, speech normal. Motor/sens grossly intact bil.   Psychiatric:        Mood and Affect: Mood normal.     ED Results / Procedures / Treatments   Labs (all labs ordered are listed, but only abnormal results are displayed) Results for orders placed or performed during the hospital encounter of 01/26/23  Comprehensive metabolic panel  Result Value Ref Range   Sodium 128 (L) 135 - 145 mmol/L   Potassium 3.9 3.5 - 5.1 mmol/L   Chloride 98 98 - 111 mmol/L   CO2 25 22 - 32 mmol/L   Glucose, Bld 89 70 - 99 mg/dL   BUN 13 8 - 23 mg/dL   Creatinine, Ser 1.37 (H) 0.44 - 1.00 mg/dL   Calcium 7.7 (L) 8.9 - 10.3 mg/dL   Total Protein 6.2 (L) 6.5 - 8.1 g/dL   Albumin 2.8 (L) 3.5 - 5.0 g/dL   AST 23 15 -  41 U/L   ALT 16 0 - 44 U/L   Alkaline Phosphatase 58 38 - 126 U/L   Total Bilirubin 0.2 (L) 0.3 - 1.2 mg/dL   GFR, Estimated 38 (L) >60 mL/min   Anion gap  5 5 - 15  Lactic acid, plasma  Result Value Ref Range   Lactic Acid, Venous 1.9 0.5 - 1.9 mmol/L  CBC with Differential  Result Value Ref Range   WBC 5.4 4.0 - 10.5 K/uL   RBC 4.00 3.87 - 5.11 MIL/uL   Hemoglobin 11.5 (L) 12.0 - 15.0 g/dL   HCT 35.5 (L) 36.0 - 46.0 %   MCV 88.8 80.0 - 100.0 fL   MCH 28.8 26.0 - 34.0 pg   MCHC 32.4 30.0 - 36.0 g/dL   RDW 14.6 11.5 - 15.5 %   Platelets 213 150 - 400 K/uL   nRBC 0.0 0.0 - 0.2 %   Neutrophils Relative % 57 %   Neutro Abs 3.1 1.7 - 7.7 K/uL   Lymphocytes Relative 20 %   Lymphs Abs 1.1 0.7 - 4.0 K/uL   Monocytes Relative 13 %   Monocytes Absolute 0.7 0.1 - 1.0 K/uL   Eosinophils Relative 9 %   Eosinophils Absolute 0.5 0.0 - 0.5 K/uL   Basophils Relative 0 %   Basophils Absolute 0.0 0.0 - 0.1 K/uL   Immature Granulocytes 1 %   Abs Immature Granulocytes 0.04 0.00 - 0.07 K/uL  Protime-INR  Result Value Ref Range   Prothrombin Time 13.7 11.4 - 15.2 seconds   INR 1.1 0.8 - 1.2  Urinalysis, Routine w reflex microscopic -Urine, Clean Catch  Result Value Ref Range   Color, Urine YELLOW YELLOW   APPearance CLEAR CLEAR   Specific Gravity, Urine 1.012 1.005 - 1.030   pH 6.0 5.0 - 8.0   Glucose, UA NEGATIVE NEGATIVE mg/dL   Hgb urine dipstick NEGATIVE NEGATIVE   Bilirubin Urine NEGATIVE NEGATIVE   Ketones, ur NEGATIVE NEGATIVE mg/dL   Protein, ur NEGATIVE NEGATIVE mg/dL   Nitrite NEGATIVE NEGATIVE   Leukocytes,Ua SMALL (A) NEGATIVE   RBC / HPF 6-10 0 - 5 RBC/hpf   WBC, UA 6-10 0 - 5 WBC/hpf   Bacteria, UA NONE SEEN NONE SEEN   Squamous Epithelial / HPF 0-5 0 - 5 /HPF     EKG EKG Interpretation  Date/Time:  Monday January 26 2023 13:29:01 EST Ventricular Rate:  62 PR Interval:  221 QRS Duration: 90 QT Interval:  452 QTC Calculation: 459 R Axis:   43 Text  Interpretation: Sinus rhythm Atrial premature complex Prolonged PR interval Low voltage, precordial leads Nonspecific T wave abnormality No significant change since last tracing Confirmed by Lajean Saver [phone]) on 01/26/2023 1:38:12 PM  Radiology DG Chest Port 1 View  Result Date: 01/26/2023 CLINICAL DATA:  Sepsis. EXAM: PORTABLE CHEST 1 VIEW COMPARISON:  Chest x-ray dated September 17, 2022. FINDINGS: The heart size and mediastinal contours are within normal limits. Normal pulmonary vascularity. No focal consolidation, pleural effusion, or pneumothorax. No acute osseous abnormality. IMPRESSION: No active disease. Electronically Signed   By: Titus Dubin M.D.   On: 01/26/2023 14:32    Procedures Procedures    Medications Ordered in ED Medications  sodium chloride 0.9 % bolus 1,000 mL (has no administration in time range)  cefTRIAXone (ROCEPHIN) 1 g in sodium chloride 0.9 % 100 mL IVPB (has no administration in time range)  lactated ringers bolus 1,000 mL (900 mLs Intravenous Bolus 01/26/23 1359)    ED Course/ Medical Decision Making/ A&P                             Medical Decision Making Problems Addressed: Borderline blood pressure:  acute illness or injury Dehydration: acute illness or injury with systemic symptoms that poses a threat to life or bodily functions General weakness: acute illness or injury History of urinary tract infection: acute illness or injury with systemic symptoms that poses a threat to life or bodily functions Hyponatremia: acute illness or injury with systemic symptoms that poses a threat to life or bodily functions  Amount and/or Complexity of Data Reviewed Independent Historian: EMS    Details: Ems/fam hx External Data Reviewed: notes. Labs: ordered. Decision-making details documented in ED Course. Radiology: ordered and independent interpretation performed. Decision-making details documented in ED Course.  Risk Prescription drug management. Decision  regarding hospitalization.   Iv ns. Continuous pulse ox and cardiac monitoring. Labs ordered/sent. Imaging ordered.   Differential diagnosis includes uti, dehydration, aki, sepsis, etc. Dispo decision including potential need for admission considered - will get labs and imaging and reassess.   Reviewed nursing notes and prior charts for additional history. External reports reviewed. Additional history from: ems, family.   Cardiac monitor: sinus rhythm, rate 66.  LR bolus.   Labs reviewed/interpreted by me - wbc and hct normal. Na mildly low. Ns bolus. Ua w a few wbc (pt on abx therapy, and dose rocephin given in ED).   Xrays reviewed/interpreted by me - no pna.  Additional ivf in ED.   Bp cuff adjusted and repeat bp (by me): 105/65.    Pt continues to deny any pain or other specific c/o. No chest pain. Chest cta. Sats 95%. No abd pain or tenderness. No headache. No nv.    Pt receiving ivf, trial of po fluids/food.  Signed out to Dr Melina Copa that if bp continues improved, tolerating po, etc, potential d/c to facility then.            Final Clinical Impression(s) / ED Diagnoses Final diagnoses:  None    Rx / DC Orders ED Discharge Orders     None         Lajean Saver, MD 01/26/23 1352    Lajean Saver, MD 01/26/23 1539

## 2023-01-26 NOTE — ED Provider Notes (Signed)
Sign out from Dr Ashok Cordia.  88 year old female from nursing home here for general weakness.  Blood pressure is soft here.  Labs showing low sodium elevated creatinine.  It is for reassessment after IV fluids.  Possibly can return back to her facility Physical Exam  BP (!) 87/52   Pulse (!) 59   Temp 98.4 F (36.9 C) (Oral)   Resp 19   Ht '5\' 4"'$  (1.626 m)   Wt 71.4 kg   SpO2 94%   BMI 27.02 kg/m   Physical Exam  Procedures  Procedures  ED Course / MDM    Medical Decision Making Amount and/or Complexity of Data Reviewed Labs: ordered. Radiology: ordered.  Risk Decision regarding hospitalization.   Patient's blood pressure remains in the 90s and 100.  She is minimally interactive.  Daughter is here.  She states she would like everything done although would not want interventions if it is futile.  Discussed with [facility] hospitalist Dr. Denton Brick who will evaluate patient for admission.       Hayden Rasmussen, MD 01/27/23 [phone]

## 2023-01-26 NOTE — Assessment & Plan Note (Addendum)
Lethargy, weakness, not responding to family, now more awake.  Baseline dementia. AMS- Likely from dehydration-poor oral intake, recent bout of diarrhea, per med list she is also on Lasix.  Also likely recovering from UTI.  Does not appear urine cultures were obtained at nursing home.  No recent urine cultures on file.  She is afebrile without leukocytosis, rules out for sepsis.  Lactic acid 1.9. -Obtain head CT. -2 Liter bolus given, continue N/s 75cc/hr x 20hrs -1 g Rocephin given in ED, will continue, 1 more dose should be enough to complete course for uncomplicated UTI -Follow-up blood cultures ordered in ED

## 2023-01-26 NOTE — Assessment & Plan Note (Signed)
Sodium 128, baseline mid to high 130s.  Likely from poor oral intake, recent bout of diarrhea.  Per med list she is on Lasix. - hydrate with n/s

## 2023-01-26 NOTE — Assessment & Plan Note (Addendum)
-  Cr- 1.37 at time of admission -Baseline 0.8-1.  -Most likely secondary to dehydration, soft blood pressure, UTI and continue use of nephrotoxic agents. -Medication has been adjusted at time of discharge to minimize usage of nephrotoxic agents -Patient advised to maintain adequate hydration -After fluid resuscitation renal function back to patient's baseline -Complete antibiotic therapy for UTI.

## 2023-01-27 DIAGNOSIS — Z87440 Personal history of urinary (tract) infections: Secondary | ICD-10-CM

## 2023-01-27 DIAGNOSIS — E860 Dehydration: Secondary | ICD-10-CM

## 2023-01-27 DIAGNOSIS — R531 Weakness: Secondary | ICD-10-CM

## 2023-01-27 DIAGNOSIS — I10 Essential (primary) hypertension: Secondary | ICD-10-CM | POA: Diagnosis not present

## 2023-01-27 DIAGNOSIS — N390 Urinary tract infection, site not specified: Secondary | ICD-10-CM | POA: Diagnosis not present

## 2023-01-27 DIAGNOSIS — E039 Hypothyroidism, unspecified: Secondary | ICD-10-CM | POA: Diagnosis not present

## 2023-01-27 DIAGNOSIS — E119 Type 2 diabetes mellitus without complications: Secondary | ICD-10-CM | POA: Diagnosis not present

## 2023-01-27 DIAGNOSIS — E1169 Type 2 diabetes mellitus with other specified complication: Secondary | ICD-10-CM | POA: Diagnosis not present

## 2023-01-27 DIAGNOSIS — E785 Hyperlipidemia, unspecified: Secondary | ICD-10-CM | POA: Diagnosis not present

## 2023-01-27 DIAGNOSIS — E871 Hypo-osmolality and hyponatremia: Secondary | ICD-10-CM | POA: Diagnosis not present

## 2023-01-27 DIAGNOSIS — G9341 Metabolic encephalopathy: Secondary | ICD-10-CM | POA: Diagnosis not present

## 2023-01-27 DIAGNOSIS — M810 Age-related osteoporosis without current pathological fracture: Secondary | ICD-10-CM | POA: Diagnosis not present

## 2023-01-27 LAB — BASIC METABOLIC PANEL
Anion gap: 9 (ref 5–15)
BUN: 10 mg/dL (ref 8–23)
CO2: 23 mmol/L (ref 22–32)
Calcium: 7.9 mg/dL — ABNORMAL LOW (ref 8.9–10.3)
Chloride: 101 mmol/L (ref 98–111)
Creatinine, Ser: 0.92 mg/dL (ref 0.44–1.00)
GFR, Estimated: >60 mL/min (ref >60)
Glucose, Bld: 86 mg/dL (ref 70–99)
Potassium: 3.6 mmol/L (ref 3.5–5.1)
Sodium: 133 mmol/L — ABNORMAL LOW (ref 135–145)

## 2023-01-27 LAB — CBC
HCT: 34 % — ABNORMAL LOW (ref 36.0–46.0)
Hemoglobin: 10.8 g/dL — ABNORMAL LOW (ref 12.0–15.0)
MCH: 28.1 pg (ref 26.0–34.0)
MCHC: 31.8 g/dL (ref 30.0–36.0)
MCV: 88.5 fL (ref 80.0–100.0)
Platelets: 194 10*3/uL (ref 150–400)
RBC: 3.84 MIL/uL — ABNORMAL LOW (ref 3.87–5.11)
RDW: 14.6 % (ref 11.5–15.5)
WBC: 5.6 10*3/uL (ref 4.0–10.5)
nRBC: 0 % (ref 0.0–0.2)

## 2023-01-27 LAB — GLUCOSE, CAPILLARY
Glucose-Capillary: 88 mg/dL (ref 70–99)
Glucose-Capillary: 194 mg/dL — ABNORMAL HIGH (ref 70–99)

## 2023-01-27 MED ORDER — DOCUSATE SODIUM 100 MG PO CAPS: 100.0000 mg | ORAL_CAPSULE | Freq: Two times a day (BID) | ORAL | 0 refills | Status: DC | PRN

## 2023-01-27 MED ORDER — SACCHAROMYCES BOULARDII 250 MG PO CAPS: 250.0000 mg | ORAL_CAPSULE | Freq: Two times a day (BID) | ORAL | 0 refills | Status: AC

## 2023-01-27 MED ORDER — TELMISARTAN 80 MG PO TABS: 40.0000 mg | ORAL_TABLET | Freq: Every day | ORAL | Status: DC

## 2023-01-27 MED ORDER — FUROSEMIDE 20 MG PO TABS: 40.0000 mg | ORAL_TABLET | Freq: Every day | ORAL | Status: DC | PRN

## 2023-01-27 MED ORDER — METFORMIN HCL ER 500 MG PO TB24: 500.0000 mg | ORAL_TABLET | Freq: Every day | ORAL | Status: DC

## 2023-01-27 MED ORDER — METOPROLOL SUCCINATE ER 100 MG PO TB24: 100.0000 mg | ORAL_TABLET | Freq: Every day | ORAL | Status: AC

## 2023-01-27 MED ORDER — CEFDINIR 300 MG PO CAPS: 300.0000 mg | ORAL_CAPSULE | Freq: Two times a day (BID) | ORAL | 0 refills | Status: AC

## 2023-01-27 NOTE — TOC Transition Note (Signed)
Transition of Care [facility]) - CM/SW Discharge Note   Patient Details  Name: Glenda Nolan MRN: [id number] Date of Birth: [birth year]  Transition of Care [facility]) CM/SW Contact:  Shade Flood, LCSW Phone Number: 01/27/2023, 1:54 PM   Clinical Narrative:     Pt admitted observation status from [facility]. Per MD, pt medically stable to return today. Updated [name] at [facility]. No FL2 is needed since pt was obs less than 24 hours.  Updated pt's daughter, [name].  DC clinical sent electronically. RN to call report. Transport arranged.  No other TOC needs for dc.  Expected Discharge Plan: Long Term Nursing Home Barriers to Discharge: Barriers Resolved   Patient Goals and CMS Choice Patient states their goals for this hospitalization and ongoing recovery are:: get better CMS Medicare.gov Compare Post Acute Care list provided to:: Patient Represenative (must comment) Choice offered to / list presented to : Adult Children  Expected Discharge Plan and Services Expected Discharge Plan: Long Term Nursing Home In-house Referral: Clinical Social Work   Post Acute Care Choice: Resumption of Svcs/PTA Provider Living arrangements for the past 2 months: [place] Expected Discharge Date: 01/27/23                                    Prior Living Arrangements/Services Living arrangements for the past 2 months: [place] Lives with:: Facility Resident Patient language and need for interpreter reviewed:: Yes Do you feel safe going back to the place where you live?: Yes      Need for Family Participation in Patient Care: No (Comment) Care giver support system in place?: Yes (comment)   Criminal Activity/Legal Involvement Pertinent to Current Situation/Hospitalization: No - Comment as needed  Activities of Daily Living Home Assistive Devices/Equipment: Dentures (specify type), Transfer belt, Walker (specify type) ADL Screening (condition at  time of admission) Patient's cognitive ability adequate to safely complete daily activities?: No Is the patient deaf or have difficulty hearing?: No Does the patient have difficulty seeing, even when wearing glasses/contacts?: No Does the patient have difficulty concentrating, remembering, or making decisions?: Yes Patient able to express need for assistance with ADLs?: No Does the patient have difficulty dressing or bathing?: Yes Independently performs ADLs?: No Communication: Independent Dressing (OT): Needs assistance Is this a change from baseline?: Pre-admission baseline Grooming: Independent Feeding: Independent Bathing: Needs assistance Is this a change from baseline?: Pre-admission baseline Toileting: Needs assistance Is this a change from baseline?: Pre-admission baseline In/Out Bed: Dependent Is this a change from baseline?: Pre-admission baseline Walks in Home: Needs assistance Is this a change from baseline?: Pre-admission baseline Does the patient have difficulty walking or climbing stairs?: Yes Weakness of Legs: Both Weakness of Arms/Hands: None  Permission Sought/Granted                  Emotional Assessment       Orientation: : Oriented to Self Alcohol / Substance Use: Not Applicable Psych Involvement: No (comment)  Admission diagnosis:  Dehydration [E86.0] Hyponatremia [E87.1] History of urinary tract infection [Z87.440] General weakness [R53.1] Borderline blood pressure 0000000 Acute metabolic encephalopathy [id number] Patient Active Problem List   Diagnosis Date Noted   General weakness 01/27/2023   History of urinary tract infection 01/27/2023   Dehydration [id number]   Acute metabolic encephalopathy [id number]   Hyponatremia 01/26/2023   Pressure injury of skin 08/05/2022   Diarrhea 07/30/2022   Choledocholithiasis 07/28/2022  Dementia ([place]) 07/28/2022   AKI (acute kidney injury) ([place]) 07/28/2022   Diabetes ([place]) 07/28/2022   Hypokalemia  07/28/2022   Spinal stenosis of lumbar region with neurogenic claudication 05/24/2019   Spondylosis without myelopathy or radiculopathy, lumbar region 05/24/2019   S/P TKR (total knee replacement), left 03/18/2017 03/18/2017   Heme positive stool 03/22/2012   Melena 03/22/2012   GERD (gastroesophageal reflux disease) 03/22/2012   CARCINOMA, COLON 04/26/2009   MIGRAINE HEADACHE 04/26/2009   Essential hypertension 04/26/2009   DEGENERATIVE JOINT DISEASE 04/26/2009   IRON DEFICIENCY ANEMIA, HX OF 04/26/2009   KNEE, ARTHRITIS, DEGEN./OSTEO 03/01/2009   KNEE PAIN 03/01/2009   PCP:  Celene Squibb, MD Pharmacy:   [facility], [place] [address] [place] [address] Phone: [phone] Fax: [phone]  [place], [place] [address] [place] [address] Phone: [phone] Fax: [phone]     Social Determinants of Health (SDOH) Interventions    Readmission Risk Interventions     No data to display            Barriers to Discharge: Barriers Resolved   Patient Goals and CMS Choice CMS Medicare.gov Compare Post Acute Care list provided to:: Patient Represenative (must comment) Choice offered to / list presented to : Adult Children  Discharge Placement                         Discharge Plan and Services Additional resources added to the After Visit Summary for   In-house Referral: Clinical Social Work   Post Acute Care Choice: Resumption of Svcs/PTA Provider                               Social Determinants of Health (SDOH) Interventions SDOH Screenings   Tobacco Use: Medium Risk (01/26/2023)     Readmission Risk Interventions     No data to display

## 2023-01-27 NOTE — Discharge Summary (Signed)
Physician Discharge Summary   Patient: Glenda Nolan MRN: [id number] DOB: Feb 02, 1935  Admit date:     01/26/2023  Discharge date: 01/27/23  Discharge Physician: Barton Dubois   PCP: Celene Squibb, MD   Recommendations at discharge:  Assess blood pressure and adjust antihypertensive regimen as needed Repeat basic metabolic panel to follow electrolytes and renal function  Discharge Diagnoses: Principal Problem:   Acute metabolic encephalopathy Active Problems:   AKI (acute kidney injury) ([place])   Hyponatremia   Essential hypertension   Dementia ([facility])   Diabetes ([place])   General weakness   History of urinary tract infection   Dehydration  Hospital Course: As per H&P written by Dr. Denton Brick on 01/26/2023 Glenda Nolan is a 88 y.o. female with medical history significant for hypertension, diabetes mellitus, dementia.  Patient was brought to the ED via EMS with reports of altered mental status.  She was diagnosed with a UTI and started on Bactrim 3/1, she was to complete a 10-day course.  But today, patient was very drowsy, hard to arouse, not following directions.  Family reports off-and-on diarrhea over the past few weeks, but this has resolved. At baseline, patient has significant dementia, often times she is able to recognize family, but makes mistakes with their names, she is rarely able to answer questions appropriately, ambulates with wheelchair.   At the time of my evaluation, patient was initially lethargic, very drowsy, later woke up, stated she felt cold, responded to family, but not answering questions.   ED Course: Hypotensive down to 87/52, improved with IV fluid bolus.  Tmax 98.4.  Heart rate 50s to 60s.  Respiratory rate 18-21.  O2 sats greater than 90% on room air. UA with small leukocytes.  EKG without significant changes.  Creatinine mildly elevated 1.37.  Sodium 128.  Lactic acid 1.9.  1 g Rocephin given, 2 L bolus given. Hospitalist to admit for generalized weakness, AKI,  AMS, and hypotension.  Assessment and Plan: * Acute metabolic encephalopathy -Lethargy, weakness, not responding to family at time of admission -Multifactorial in the setting of dehydration, transient hypotension, acute kidney injury, UTI and acute deconditioning in a patient with underlying dementia. -With treatment for UTI, fluid resuscitation and stabilization of her vital signs/renal function patient mentation improved and back to baseline at discharge. -Continue conservative mentation, supportive care and adequate hydration.    Hyponatremia -Sodium 128, baseline mid to high 130s.   -Likely from poor oral intake, recent diarrhea and dehydration. -Patient's diuretics has been changed to as needed at time of discharge. -Advise patient's and family members to ensure good hydration Repeat basic metabolic panel follow-up visit to assess electrolytes stability and trend.  AKI (acute kidney injury) ([place]) -Cr- 1.37 at time of admission -Baseline 0.8-1.  -Most likely secondary to dehydration, soft blood pressure, UTI and continue use of nephrotoxic agents. -Medication has been adjusted at time of discharge to minimize usage of nephrotoxic agents -Patient advised to maintain adequate hydration -After fluid resuscitation renal function back to patient's baseline -Complete antibiotic therapy for UTI.    Diabetes ([place]) -Continue to follow CBGs/A1c as an outpatient -Most recent A1c 6.8 -Resume home dose of hypoglycemic agent -Continue to maintain adequate hydration and follow modified carbohydrate diet.  Dementia ([place]) -At baseline wheelchair-bound -Per family members at bedside patient's mentation is back to baseline -Continue constant reorientation and supportive care -Resume the use of donepezil. -Continue patient follow-up with PCP. -Maintain adequate hydration.  Essential hypertension -Hypotensive, systolic down to Q000111Q transiently. -  Antihypertensive agents adjusted and patient  blood pressure/vital signs are stable at time of discharge -Continue close monitoring and maintain adequate hydration.  General weakness -Return back to skilled nursing facility for further care and rehabilitation/conditioning. -Maintain adequate hydration.   Consultants: None Procedures performed: See below for x-ray reports. Disposition: Skilled nursing facility long-term resident. Diet recommendation: Heart healthy/modified carbohydrates.  DISCHARGE MEDICATION: Allergies as of 01/27/2023   No Known Allergies      Medication List     STOP taking these medications    fluconazole 150 MG tablet Commonly known as: DIFLUCAN   metFORMIN 500 MG tablet Commonly known as: GLUCOPHAGE Replaced by: metFORMIN 500 MG 24 hr tablet   metoprolol tartrate 100 MG tablet Commonly known as: LOPRESSOR   sulfamethoxazole-trimethoprim 400-80 MG tablet Commonly known as: BACTRIM       TAKE these medications    acetaminophen 325 MG tablet Commonly known as: TYLENOL Take 325 mg by mouth daily after lunch.   alendronate 70 MG tablet Commonly known as: FOSAMAX Take 70 mg by mouth every Monday.   aspirin EC 325 MG tablet Take 1 tablet (325 mg total) by mouth daily with breakfast.   cefdinir 300 MG capsule Commonly known as: OMNICEF Take 1 capsule (300 mg total) by mouth 2 (two) times daily for 4 days.   cholecalciferol 25 MCG (1000 UT) tablet Generic drug: Cholecalciferol Take 1,000 Units by mouth daily after lunch.   docusate sodium 100 MG capsule Commonly known as: COLACE Take 1 capsule (100 mg total) by mouth 2 (two) times daily as needed for mild constipation. What changed:  when to take this reasons to take this   donepezil 10 MG tablet Commonly known as: ARICEPT Take 10 mg by mouth daily. What changed: Another medication with the same name was removed. Continue taking this medication, and follow the directions you see here.   furosemide 20 MG tablet Commonly known  as: LASIX Take 2 tablets (40 mg total) by mouth daily as needed for fluid or edema. What changed:  when to take this reasons to take this   levothyroxine 75 MCG tablet Commonly known as: SYNTHROID Take 75 mcg by mouth every morning.   metFORMIN 500 MG 24 hr tablet Commonly known as: GLUCOPHAGE-XR Take 1 tablet (500 mg total) by mouth daily with breakfast. Replaces: metFORMIN 500 MG tablet   metoprolol succinate 100 MG 24 hr tablet Commonly known as: TOPROL-XL Take 1 tablet (100 mg total) by mouth daily. What changed: when to take this   omeprazole 20 MG capsule Commonly known as: PRILOSEC Take 20 mg by mouth daily after breakfast. (0800)   potassium chloride 10 MEQ CR capsule Commonly known as: MICRO-K Take 10 mEq by mouth daily.   PX SENIOR VITAMIN PO Take 1 tablet by mouth daily after lunch.   raloxifene 60 MG tablet Commonly known as: EVISTA Take 60 mg by mouth daily after breakfast. (0800)   saccharomyces boulardii 250 MG capsule Commonly known as: Florastor Take 1 capsule (250 mg total) by mouth 2 (two) times daily.   simvastatin 40 MG tablet Commonly known as: ZOCOR Take 40 mg by mouth at bedtime.   telmisartan 80 MG tablet Commonly known as: MICARDIS Take 0.5 tablets (40 mg total) by mouth daily. What changed: how much to take        Follow-up Information     Celene Squibb, MD. Schedule an appointment as soon as possible for a visit in 10 day(s).   Specialty: Internal  Medicine Contact information: [place] [facility] [address] [phone]                Discharge Exam: Filed Weights   01/26/23 1332 01/26/23 1831  Weight: 71.4 kg 71.1 kg   General exam: Alert, awake, oriented x 1; in no acute distress and following commands appropriately.  Interacting and talking with family members at bedside (per their information patient's mentation at baseline). Respiratory system: Clear to auscultation. Respiratory effort normal.   Saturation on room air.  No using accessory muscle. Cardiovascular system: Rate controlled, no rubs, no gallops, no JVD. Gastrointestinal system: Abdomen is nondistended, soft and nontender. No organomegaly or masses felt. Normal bowel sounds heard. Central nervous system: No focal neurological deficits. Extremities: No cyanosis or clubbing. Skin: No petechiae. Psychiatry: Mood & affect appropriate.    Condition at discharge: Stable and improved.  The results of significant diagnostics from this hospitalization (including imaging, microbiology, ancillary and laboratory) are listed below for reference.   Imaging Studies: CT HEAD WO CONTRAST (5MM)  Result Date: 01/26/2023 CLINICAL DATA:  Altered mental status EXAM: CT HEAD WITHOUT CONTRAST TECHNIQUE: Contiguous axial images were obtained from the base of the skull through the vertex without intravenous contrast. RADIATION DOSE REDUCTION: This exam was performed according to the departmental dose-optimization program which includes automated exposure control, adjustment of the mA and/or kV according to patient size and/or use of iterative reconstruction technique. COMPARISON:  Head CT 09/17/2022 FINDINGS: Brain: No evidence of acute infarction, hemorrhage, hydrocephalus, extra-axial collection or mass lesion/mass effect. Mild diffuse atrophy is unchanged. There is stable extensive periventricular and deep white matter hypodensity, likely chronic small vessel ischemic change. Vascular: Atherosclerotic calcifications are present within the cavernous internal carotid arteries. Skull: Normal. Negative for fracture or focal lesion. Sinuses/Orbits: No acute finding. Other: None. IMPRESSION: 1. No acute intracranial abnormality. 2. Stable atrophy and chronic small vessel ischemic changes. Electronically Signed   By: Ronney Asters M.D.   On: 01/26/2023 22:12   DG Chest Port 1 View  Result Date: 01/26/2023 CLINICAL DATA:  Sepsis. EXAM: PORTABLE CHEST 1 VIEW  COMPARISON:  Chest x-ray dated September 17, 2022. FINDINGS: The heart size and mediastinal contours are within normal limits. Normal pulmonary vascularity. No focal consolidation, pleural effusion, or pneumothorax. No acute osseous abnormality. IMPRESSION: No active disease. Electronically Signed   By: Titus Dubin M.D.   On: 01/26/2023 14:32    Microbiology: Results for orders placed or performed during the hospital encounter of 07/28/22  Culture, blood (Routine X 2) w Reflex to ID Panel     Status: None   Collection Time: 07/28/22  9:22 PM   Specimen: BLOOD  Result Value Ref Range Status   Specimen Description BLOOD LEFT ANTECUBITAL  Final   Special Requests   Final    BOTTLES DRAWN AEROBIC AND ANAEROBIC Blood Culture adequate volume   Culture   Final    NO GROWTH 5 DAYS Performed at [facility], [address], [place], [place] [address]    Report Status 08/02/2022 FINAL  Final  Culture, blood (Routine X 2) w Reflex to ID Panel     Status: None   Collection Time: 07/28/22  9:22 PM   Specimen: BLOOD  Result Value Ref Range Status   Specimen Description BLOOD BLOOD LEFT WRIST  Final   Special Requests   Final    BOTTLES DRAWN AEROBIC AND ANAEROBIC Blood Culture adequate volume   Culture   Final    NO  GROWTH 5 DAYS Performed at [facility], [address], [place], [place] [address]    Report Status 08/02/2022 FINAL  Final    Labs: CBC: Recent Labs  Lab 01/26/23 1349 01/27/23 0424  WBC 5.4 5.6  NEUTROABS 3.1  --   HGB 11.5* 10.8*  HCT 35.5* 34.0*  MCV 88.8 88.5  PLT 213 [id number]   Basic Metabolic Panel: Recent Labs  Lab 01/26/23 1349 01/27/23 0424  NA 128* 133*  K 3.9 3.6  CL 98 101  CO2 25 23  GLUCOSE 89 86  BUN 13 10  CREATININE 1.37* 0.92  CALCIUM 7.7* 7.9*   Liver Function Tests: Recent Labs  Lab 01/26/23 1349  AST 23  ALT 16  ALKPHOS 58  BILITOT 0.2*  PROT 6.2*  ALBUMIN 2.8*   CBG: Recent Labs  Lab 01/26/23 2025 01/27/23 0728 01/27/23 1125   GLUCAP 108* 88 194*    Discharge time spent: greater than 30 minutes.  Signed: Barton Dubois, MD [facility] 01/27/2023

## 2023-01-27 NOTE — Assessment & Plan Note (Signed)
-  Return back to skilled nursing facility for further care and rehabilitation/conditioning. -Maintain adequate hydration.

## 2023-01-28 DIAGNOSIS — N390 Urinary tract infection, site not specified: Secondary | ICD-10-CM | POA: Diagnosis not present

## 2023-01-28 DIAGNOSIS — E785 Hyperlipidemia, unspecified: Secondary | ICD-10-CM | POA: Diagnosis not present

## 2023-01-28 DIAGNOSIS — E119 Type 2 diabetes mellitus without complications: Secondary | ICD-10-CM | POA: Diagnosis not present

## 2023-01-28 DIAGNOSIS — M810 Age-related osteoporosis without current pathological fracture: Secondary | ICD-10-CM | POA: Diagnosis not present

## 2023-01-28 DIAGNOSIS — I10 Essential (primary) hypertension: Secondary | ICD-10-CM | POA: Diagnosis not present

## 2023-01-28 DIAGNOSIS — G9341 Metabolic encephalopathy: Secondary | ICD-10-CM | POA: Diagnosis not present

## 2023-01-28 DIAGNOSIS — E039 Hypothyroidism, unspecified: Secondary | ICD-10-CM | POA: Diagnosis not present

## 2023-01-29 DIAGNOSIS — N390 Urinary tract infection, site not specified: Secondary | ICD-10-CM | POA: Diagnosis not present

## 2023-01-29 NOTE — Progress Notes (Signed)
Note entered 3/7 5pm:   Pts insurance contacted TOC stating a peer to peer has been requested. No insurance Josem Kaufmann was started prior to pt discharging from [place] back to [place] at [facility].

## 2023-01-30 DIAGNOSIS — I10 Essential (primary) hypertension: Secondary | ICD-10-CM | POA: Diagnosis not present

## 2023-01-30 DIAGNOSIS — E119 Type 2 diabetes mellitus without complications: Secondary | ICD-10-CM | POA: Diagnosis not present

## 2023-01-31 LAB — CULTURE, BLOOD (ROUTINE X 2)
Culture: NO GROWTH
Special Requests: ADEQUATE

## 2023-02-01 LAB — CULTURE, BLOOD (ROUTINE X 2)
Culture: NO GROWTH
Special Requests: ADEQUATE

## 2023-02-03 DIAGNOSIS — K5900 Constipation, unspecified: Secondary | ICD-10-CM | POA: Diagnosis not present

## 2023-02-03 DIAGNOSIS — M810 Age-related osteoporosis without current pathological fracture: Secondary | ICD-10-CM | POA: Diagnosis not present

## 2023-02-03 DIAGNOSIS — E039 Hypothyroidism, unspecified: Secondary | ICD-10-CM | POA: Diagnosis not present

## 2023-02-05 DIAGNOSIS — R2689 Other abnormalities of gait and mobility: Secondary | ICD-10-CM | POA: Diagnosis not present

## 2023-02-05 DIAGNOSIS — F03918 Unspecified dementia, unspecified severity, with other behavioral disturbance: Secondary | ICD-10-CM | POA: Diagnosis not present

## 2023-02-05 DIAGNOSIS — M6281 Muscle weakness (generalized): Secondary | ICD-10-CM | POA: Diagnosis not present

## 2023-02-06 DIAGNOSIS — F03918 Unspecified dementia, unspecified severity, with other behavioral disturbance: Secondary | ICD-10-CM | POA: Diagnosis not present

## 2023-02-06 DIAGNOSIS — R2689 Other abnormalities of gait and mobility: Secondary | ICD-10-CM | POA: Diagnosis not present

## 2023-02-06 DIAGNOSIS — M6281 Muscle weakness (generalized): Secondary | ICD-10-CM | POA: Diagnosis not present

## 2023-02-09 DIAGNOSIS — M6281 Muscle weakness (generalized): Secondary | ICD-10-CM | POA: Diagnosis not present

## 2023-02-09 DIAGNOSIS — F03918 Unspecified dementia, unspecified severity, with other behavioral disturbance: Secondary | ICD-10-CM | POA: Diagnosis not present

## 2023-02-09 DIAGNOSIS — R2689 Other abnormalities of gait and mobility: Secondary | ICD-10-CM | POA: Diagnosis not present

## 2023-02-10 DIAGNOSIS — F03918 Unspecified dementia, unspecified severity, with other behavioral disturbance: Secondary | ICD-10-CM | POA: Diagnosis not present

## 2023-02-10 DIAGNOSIS — M6281 Muscle weakness (generalized): Secondary | ICD-10-CM | POA: Diagnosis not present

## 2023-02-10 DIAGNOSIS — R2689 Other abnormalities of gait and mobility: Secondary | ICD-10-CM | POA: Diagnosis not present

## 2023-02-11 DIAGNOSIS — R2689 Other abnormalities of gait and mobility: Secondary | ICD-10-CM | POA: Diagnosis not present

## 2023-02-11 DIAGNOSIS — M6281 Muscle weakness (generalized): Secondary | ICD-10-CM | POA: Diagnosis not present

## 2023-02-11 DIAGNOSIS — F03918 Unspecified dementia, unspecified severity, with other behavioral disturbance: Secondary | ICD-10-CM | POA: Diagnosis not present

## 2023-02-12 DIAGNOSIS — F03918 Unspecified dementia, unspecified severity, with other behavioral disturbance: Secondary | ICD-10-CM | POA: Diagnosis not present

## 2023-02-12 DIAGNOSIS — M6281 Muscle weakness (generalized): Secondary | ICD-10-CM | POA: Diagnosis not present

## 2023-02-12 DIAGNOSIS — R2689 Other abnormalities of gait and mobility: Secondary | ICD-10-CM | POA: Diagnosis not present

## 2023-02-13 DIAGNOSIS — R2689 Other abnormalities of gait and mobility: Secondary | ICD-10-CM | POA: Diagnosis not present

## 2023-02-13 DIAGNOSIS — M6281 Muscle weakness (generalized): Secondary | ICD-10-CM | POA: Diagnosis not present

## 2023-02-13 DIAGNOSIS — F03918 Unspecified dementia, unspecified severity, with other behavioral disturbance: Secondary | ICD-10-CM | POA: Diagnosis not present

## 2023-02-16 DIAGNOSIS — F03918 Unspecified dementia, unspecified severity, with other behavioral disturbance: Secondary | ICD-10-CM | POA: Diagnosis not present

## 2023-02-16 DIAGNOSIS — R2689 Other abnormalities of gait and mobility: Secondary | ICD-10-CM | POA: Diagnosis not present

## 2023-02-16 DIAGNOSIS — M6281 Muscle weakness (generalized): Secondary | ICD-10-CM | POA: Diagnosis not present

## 2023-02-17 DIAGNOSIS — F03918 Unspecified dementia, unspecified severity, with other behavioral disturbance: Secondary | ICD-10-CM | POA: Diagnosis not present

## 2023-02-17 DIAGNOSIS — M6281 Muscle weakness (generalized): Secondary | ICD-10-CM | POA: Diagnosis not present

## 2023-02-17 DIAGNOSIS — R2689 Other abnormalities of gait and mobility: Secondary | ICD-10-CM | POA: Diagnosis not present

## 2023-02-18 DIAGNOSIS — R2689 Other abnormalities of gait and mobility: Secondary | ICD-10-CM | POA: Diagnosis not present

## 2023-02-18 DIAGNOSIS — F03918 Unspecified dementia, unspecified severity, with other behavioral disturbance: Secondary | ICD-10-CM | POA: Diagnosis not present

## 2023-02-18 DIAGNOSIS — M6281 Muscle weakness (generalized): Secondary | ICD-10-CM | POA: Diagnosis not present

## 2023-02-19 DIAGNOSIS — M6281 Muscle weakness (generalized): Secondary | ICD-10-CM | POA: Diagnosis not present

## 2023-02-19 DIAGNOSIS — R2689 Other abnormalities of gait and mobility: Secondary | ICD-10-CM | POA: Diagnosis not present

## 2023-02-19 DIAGNOSIS — F03918 Unspecified dementia, unspecified severity, with other behavioral disturbance: Secondary | ICD-10-CM | POA: Diagnosis not present

## 2023-02-20 DIAGNOSIS — M6281 Muscle weakness (generalized): Secondary | ICD-10-CM | POA: Diagnosis not present

## 2023-02-20 DIAGNOSIS — J069 Acute upper respiratory infection, unspecified: Secondary | ICD-10-CM | POA: Diagnosis not present

## 2023-02-20 DIAGNOSIS — R2689 Other abnormalities of gait and mobility: Secondary | ICD-10-CM | POA: Diagnosis not present

## 2023-02-20 DIAGNOSIS — F03918 Unspecified dementia, unspecified severity, with other behavioral disturbance: Secondary | ICD-10-CM | POA: Diagnosis not present

## 2023-02-20 DIAGNOSIS — J040 Acute laryngitis: Secondary | ICD-10-CM | POA: Diagnosis not present

## 2023-02-23 DIAGNOSIS — F03918 Unspecified dementia, unspecified severity, with other behavioral disturbance: Secondary | ICD-10-CM | POA: Diagnosis not present

## 2023-02-23 DIAGNOSIS — R2689 Other abnormalities of gait and mobility: Secondary | ICD-10-CM | POA: Diagnosis not present

## 2023-02-23 DIAGNOSIS — M6281 Muscle weakness (generalized): Secondary | ICD-10-CM | POA: Diagnosis not present

## 2023-02-24 DIAGNOSIS — R2689 Other abnormalities of gait and mobility: Secondary | ICD-10-CM | POA: Diagnosis not present

## 2023-02-24 DIAGNOSIS — M6281 Muscle weakness (generalized): Secondary | ICD-10-CM | POA: Diagnosis not present

## 2023-02-24 DIAGNOSIS — F03918 Unspecified dementia, unspecified severity, with other behavioral disturbance: Secondary | ICD-10-CM | POA: Diagnosis not present

## 2023-02-25 DIAGNOSIS — F03918 Unspecified dementia, unspecified severity, with other behavioral disturbance: Secondary | ICD-10-CM | POA: Diagnosis not present

## 2023-02-25 DIAGNOSIS — R2689 Other abnormalities of gait and mobility: Secondary | ICD-10-CM | POA: Diagnosis not present

## 2023-02-25 DIAGNOSIS — M6281 Muscle weakness (generalized): Secondary | ICD-10-CM | POA: Diagnosis not present

## 2023-02-26 DIAGNOSIS — R2689 Other abnormalities of gait and mobility: Secondary | ICD-10-CM | POA: Diagnosis not present

## 2023-02-26 DIAGNOSIS — F03918 Unspecified dementia, unspecified severity, with other behavioral disturbance: Secondary | ICD-10-CM | POA: Diagnosis not present

## 2023-02-26 DIAGNOSIS — M6281 Muscle weakness (generalized): Secondary | ICD-10-CM | POA: Diagnosis not present

## 2023-02-27 DIAGNOSIS — F03918 Unspecified dementia, unspecified severity, with other behavioral disturbance: Secondary | ICD-10-CM | POA: Diagnosis not present

## 2023-02-27 DIAGNOSIS — R2689 Other abnormalities of gait and mobility: Secondary | ICD-10-CM | POA: Diagnosis not present

## 2023-02-27 DIAGNOSIS — M6281 Muscle weakness (generalized): Secondary | ICD-10-CM | POA: Diagnosis not present

## 2023-03-02 DIAGNOSIS — M6281 Muscle weakness (generalized): Secondary | ICD-10-CM | POA: Diagnosis not present

## 2023-03-02 DIAGNOSIS — F03918 Unspecified dementia, unspecified severity, with other behavioral disturbance: Secondary | ICD-10-CM | POA: Diagnosis not present

## 2023-03-02 DIAGNOSIS — R2689 Other abnormalities of gait and mobility: Secondary | ICD-10-CM | POA: Diagnosis not present

## 2023-03-03 DIAGNOSIS — R2689 Other abnormalities of gait and mobility: Secondary | ICD-10-CM | POA: Diagnosis not present

## 2023-03-03 DIAGNOSIS — I10 Essential (primary) hypertension: Secondary | ICD-10-CM | POA: Diagnosis not present

## 2023-03-03 DIAGNOSIS — F03918 Unspecified dementia, unspecified severity, with other behavioral disturbance: Secondary | ICD-10-CM | POA: Diagnosis not present

## 2023-03-03 DIAGNOSIS — M6281 Muscle weakness (generalized): Secondary | ICD-10-CM | POA: Diagnosis not present

## 2023-03-03 DIAGNOSIS — E119 Type 2 diabetes mellitus without complications: Secondary | ICD-10-CM | POA: Diagnosis not present

## 2023-03-04 DIAGNOSIS — M6281 Muscle weakness (generalized): Secondary | ICD-10-CM | POA: Diagnosis not present

## 2023-03-04 DIAGNOSIS — F03918 Unspecified dementia, unspecified severity, with other behavioral disturbance: Secondary | ICD-10-CM | POA: Diagnosis not present

## 2023-03-04 DIAGNOSIS — R2689 Other abnormalities of gait and mobility: Secondary | ICD-10-CM | POA: Diagnosis not present

## 2023-03-05 DIAGNOSIS — R2689 Other abnormalities of gait and mobility: Secondary | ICD-10-CM | POA: Diagnosis not present

## 2023-03-05 DIAGNOSIS — F03918 Unspecified dementia, unspecified severity, with other behavioral disturbance: Secondary | ICD-10-CM | POA: Diagnosis not present

## 2023-03-05 DIAGNOSIS — M6281 Muscle weakness (generalized): Secondary | ICD-10-CM | POA: Diagnosis not present

## 2023-03-06 DIAGNOSIS — F03918 Unspecified dementia, unspecified severity, with other behavioral disturbance: Secondary | ICD-10-CM | POA: Diagnosis not present

## 2023-03-06 DIAGNOSIS — M6281 Muscle weakness (generalized): Secondary | ICD-10-CM | POA: Diagnosis not present

## 2023-03-06 DIAGNOSIS — R2689 Other abnormalities of gait and mobility: Secondary | ICD-10-CM | POA: Diagnosis not present

## 2023-03-09 DIAGNOSIS — R2689 Other abnormalities of gait and mobility: Secondary | ICD-10-CM | POA: Diagnosis not present

## 2023-03-09 DIAGNOSIS — F03918 Unspecified dementia, unspecified severity, with other behavioral disturbance: Secondary | ICD-10-CM | POA: Diagnosis not present

## 2023-03-09 DIAGNOSIS — M6281 Muscle weakness (generalized): Secondary | ICD-10-CM | POA: Diagnosis not present

## 2023-03-10 DIAGNOSIS — F03918 Unspecified dementia, unspecified severity, with other behavioral disturbance: Secondary | ICD-10-CM | POA: Diagnosis not present

## 2023-03-10 DIAGNOSIS — R2689 Other abnormalities of gait and mobility: Secondary | ICD-10-CM | POA: Diagnosis not present

## 2023-03-10 DIAGNOSIS — M6281 Muscle weakness (generalized): Secondary | ICD-10-CM | POA: Diagnosis not present

## 2023-03-11 DIAGNOSIS — M6281 Muscle weakness (generalized): Secondary | ICD-10-CM | POA: Diagnosis not present

## 2023-03-11 DIAGNOSIS — R2689 Other abnormalities of gait and mobility: Secondary | ICD-10-CM | POA: Diagnosis not present

## 2023-03-11 DIAGNOSIS — F03918 Unspecified dementia, unspecified severity, with other behavioral disturbance: Secondary | ICD-10-CM | POA: Diagnosis not present

## 2023-03-12 DIAGNOSIS — R2689 Other abnormalities of gait and mobility: Secondary | ICD-10-CM | POA: Diagnosis not present

## 2023-03-12 DIAGNOSIS — F03918 Unspecified dementia, unspecified severity, with other behavioral disturbance: Secondary | ICD-10-CM | POA: Diagnosis not present

## 2023-03-12 DIAGNOSIS — M6281 Muscle weakness (generalized): Secondary | ICD-10-CM | POA: Diagnosis not present

## 2023-03-13 DIAGNOSIS — R2689 Other abnormalities of gait and mobility: Secondary | ICD-10-CM | POA: Diagnosis not present

## 2023-03-13 DIAGNOSIS — M6281 Muscle weakness (generalized): Secondary | ICD-10-CM | POA: Diagnosis not present

## 2023-03-13 DIAGNOSIS — F03918 Unspecified dementia, unspecified severity, with other behavioral disturbance: Secondary | ICD-10-CM | POA: Diagnosis not present

## 2023-03-16 DIAGNOSIS — H1030 Unspecified acute conjunctivitis, unspecified eye: Secondary | ICD-10-CM | POA: Diagnosis not present

## 2023-03-16 DIAGNOSIS — F03918 Unspecified dementia, unspecified severity, with other behavioral disturbance: Secondary | ICD-10-CM | POA: Diagnosis not present

## 2023-03-16 DIAGNOSIS — M6281 Muscle weakness (generalized): Secondary | ICD-10-CM | POA: Diagnosis not present

## 2023-03-16 DIAGNOSIS — R2689 Other abnormalities of gait and mobility: Secondary | ICD-10-CM | POA: Diagnosis not present

## 2023-03-17 DIAGNOSIS — J309 Allergic rhinitis, unspecified: Secondary | ICD-10-CM | POA: Diagnosis not present

## 2023-03-17 DIAGNOSIS — I10 Essential (primary) hypertension: Secondary | ICD-10-CM | POA: Diagnosis not present

## 2023-03-17 DIAGNOSIS — M6281 Muscle weakness (generalized): Secondary | ICD-10-CM | POA: Diagnosis not present

## 2023-03-17 DIAGNOSIS — F03918 Unspecified dementia, unspecified severity, with other behavioral disturbance: Secondary | ICD-10-CM | POA: Diagnosis not present

## 2023-03-17 DIAGNOSIS — R2689 Other abnormalities of gait and mobility: Secondary | ICD-10-CM | POA: Diagnosis not present

## 2023-03-17 DIAGNOSIS — H5789 Other specified disorders of eye and adnexa: Secondary | ICD-10-CM | POA: Diagnosis not present

## 2023-03-17 DIAGNOSIS — R52 Pain, unspecified: Secondary | ICD-10-CM | POA: Diagnosis not present

## 2023-03-18 DIAGNOSIS — M6281 Muscle weakness (generalized): Secondary | ICD-10-CM | POA: Diagnosis not present

## 2023-03-18 DIAGNOSIS — R2689 Other abnormalities of gait and mobility: Secondary | ICD-10-CM | POA: Diagnosis not present

## 2023-03-18 DIAGNOSIS — F03918 Unspecified dementia, unspecified severity, with other behavioral disturbance: Secondary | ICD-10-CM | POA: Diagnosis not present

## 2023-03-19 DIAGNOSIS — R2689 Other abnormalities of gait and mobility: Secondary | ICD-10-CM | POA: Diagnosis not present

## 2023-03-19 DIAGNOSIS — F03918 Unspecified dementia, unspecified severity, with other behavioral disturbance: Secondary | ICD-10-CM | POA: Diagnosis not present

## 2023-03-19 DIAGNOSIS — M6281 Muscle weakness (generalized): Secondary | ICD-10-CM | POA: Diagnosis not present

## 2023-03-20 DIAGNOSIS — M6281 Muscle weakness (generalized): Secondary | ICD-10-CM | POA: Diagnosis not present

## 2023-03-20 DIAGNOSIS — F03918 Unspecified dementia, unspecified severity, with other behavioral disturbance: Secondary | ICD-10-CM | POA: Diagnosis not present

## 2023-03-20 DIAGNOSIS — R2689 Other abnormalities of gait and mobility: Secondary | ICD-10-CM | POA: Diagnosis not present

## 2023-03-23 DIAGNOSIS — F03918 Unspecified dementia, unspecified severity, with other behavioral disturbance: Secondary | ICD-10-CM | POA: Diagnosis not present

## 2023-03-23 DIAGNOSIS — R2689 Other abnormalities of gait and mobility: Secondary | ICD-10-CM | POA: Diagnosis not present

## 2023-03-23 DIAGNOSIS — M6281 Muscle weakness (generalized): Secondary | ICD-10-CM | POA: Diagnosis not present

## 2023-03-24 DIAGNOSIS — R2689 Other abnormalities of gait and mobility: Secondary | ICD-10-CM | POA: Diagnosis not present

## 2023-03-24 DIAGNOSIS — M6281 Muscle weakness (generalized): Secondary | ICD-10-CM | POA: Diagnosis not present

## 2023-03-24 DIAGNOSIS — F03918 Unspecified dementia, unspecified severity, with other behavioral disturbance: Secondary | ICD-10-CM | POA: Diagnosis not present

## 2023-03-25 DIAGNOSIS — M6281 Muscle weakness (generalized): Secondary | ICD-10-CM | POA: Diagnosis not present

## 2023-03-25 DIAGNOSIS — R2689 Other abnormalities of gait and mobility: Secondary | ICD-10-CM | POA: Diagnosis not present

## 2023-03-25 DIAGNOSIS — F03918 Unspecified dementia, unspecified severity, with other behavioral disturbance: Secondary | ICD-10-CM | POA: Diagnosis not present

## 2023-03-27 DIAGNOSIS — M6281 Muscle weakness (generalized): Secondary | ICD-10-CM | POA: Diagnosis not present

## 2023-03-27 DIAGNOSIS — R2689 Other abnormalities of gait and mobility: Secondary | ICD-10-CM | POA: Diagnosis not present

## 2023-03-27 DIAGNOSIS — R112 Nausea with vomiting, unspecified: Secondary | ICD-10-CM | POA: Diagnosis not present

## 2023-03-27 DIAGNOSIS — F03918 Unspecified dementia, unspecified severity, with other behavioral disturbance: Secondary | ICD-10-CM | POA: Diagnosis not present

## 2023-03-27 DIAGNOSIS — E119 Type 2 diabetes mellitus without complications: Secondary | ICD-10-CM | POA: Diagnosis not present

## 2023-03-30 DIAGNOSIS — R112 Nausea with vomiting, unspecified: Secondary | ICD-10-CM | POA: Diagnosis not present

## 2023-03-30 DIAGNOSIS — R2689 Other abnormalities of gait and mobility: Secondary | ICD-10-CM | POA: Diagnosis not present

## 2023-03-30 DIAGNOSIS — F03918 Unspecified dementia, unspecified severity, with other behavioral disturbance: Secondary | ICD-10-CM | POA: Diagnosis not present

## 2023-03-30 DIAGNOSIS — M6281 Muscle weakness (generalized): Secondary | ICD-10-CM | POA: Diagnosis not present

## 2023-03-31 DIAGNOSIS — M6281 Muscle weakness (generalized): Secondary | ICD-10-CM | POA: Diagnosis not present

## 2023-03-31 DIAGNOSIS — R2689 Other abnormalities of gait and mobility: Secondary | ICD-10-CM | POA: Diagnosis not present

## 2023-03-31 DIAGNOSIS — F03918 Unspecified dementia, unspecified severity, with other behavioral disturbance: Secondary | ICD-10-CM | POA: Diagnosis not present

## 2023-04-01 DIAGNOSIS — R2689 Other abnormalities of gait and mobility: Secondary | ICD-10-CM | POA: Diagnosis not present

## 2023-04-01 DIAGNOSIS — F03918 Unspecified dementia, unspecified severity, with other behavioral disturbance: Secondary | ICD-10-CM | POA: Diagnosis not present

## 2023-04-01 DIAGNOSIS — M6281 Muscle weakness (generalized): Secondary | ICD-10-CM | POA: Diagnosis not present

## 2023-04-02 DIAGNOSIS — R2689 Other abnormalities of gait and mobility: Secondary | ICD-10-CM | POA: Diagnosis not present

## 2023-04-02 DIAGNOSIS — F03918 Unspecified dementia, unspecified severity, with other behavioral disturbance: Secondary | ICD-10-CM | POA: Diagnosis not present

## 2023-04-02 DIAGNOSIS — M6281 Muscle weakness (generalized): Secondary | ICD-10-CM | POA: Diagnosis not present

## 2023-04-03 DIAGNOSIS — M6281 Muscle weakness (generalized): Secondary | ICD-10-CM | POA: Diagnosis not present

## 2023-04-03 DIAGNOSIS — E1151 Type 2 diabetes mellitus with diabetic peripheral angiopathy without gangrene: Secondary | ICD-10-CM | POA: Diagnosis not present

## 2023-04-03 DIAGNOSIS — R2689 Other abnormalities of gait and mobility: Secondary | ICD-10-CM | POA: Diagnosis not present

## 2023-04-03 DIAGNOSIS — F03918 Unspecified dementia, unspecified severity, with other behavioral disturbance: Secondary | ICD-10-CM | POA: Diagnosis not present

## 2023-04-06 DIAGNOSIS — F03918 Unspecified dementia, unspecified severity, with other behavioral disturbance: Secondary | ICD-10-CM | POA: Diagnosis not present

## 2023-04-06 DIAGNOSIS — E119 Type 2 diabetes mellitus without complications: Secondary | ICD-10-CM | POA: Diagnosis not present

## 2023-04-06 DIAGNOSIS — R2689 Other abnormalities of gait and mobility: Secondary | ICD-10-CM | POA: Diagnosis not present

## 2023-04-06 DIAGNOSIS — E785 Hyperlipidemia, unspecified: Secondary | ICD-10-CM | POA: Diagnosis not present

## 2023-04-06 DIAGNOSIS — M6281 Muscle weakness (generalized): Secondary | ICD-10-CM | POA: Diagnosis not present

## 2023-04-06 DIAGNOSIS — I10 Essential (primary) hypertension: Secondary | ICD-10-CM | POA: Diagnosis not present

## 2023-04-07 DIAGNOSIS — F03918 Unspecified dementia, unspecified severity, with other behavioral disturbance: Secondary | ICD-10-CM | POA: Diagnosis not present

## 2023-04-07 DIAGNOSIS — M6281 Muscle weakness (generalized): Secondary | ICD-10-CM | POA: Diagnosis not present

## 2023-04-07 DIAGNOSIS — R2689 Other abnormalities of gait and mobility: Secondary | ICD-10-CM | POA: Diagnosis not present

## 2023-04-08 DIAGNOSIS — I10 Essential (primary) hypertension: Secondary | ICD-10-CM | POA: Diagnosis not present

## 2023-04-08 DIAGNOSIS — E119 Type 2 diabetes mellitus without complications: Secondary | ICD-10-CM | POA: Diagnosis not present

## 2023-04-08 DIAGNOSIS — F03918 Unspecified dementia, unspecified severity, with other behavioral disturbance: Secondary | ICD-10-CM | POA: Diagnosis not present

## 2023-04-30 DIAGNOSIS — I10 Essential (primary) hypertension: Secondary | ICD-10-CM | POA: Diagnosis not present

## 2023-04-30 DIAGNOSIS — E119 Type 2 diabetes mellitus without complications: Secondary | ICD-10-CM | POA: Diagnosis not present

## 2023-04-30 DIAGNOSIS — F03918 Unspecified dementia, unspecified severity, with other behavioral disturbance: Secondary | ICD-10-CM | POA: Diagnosis not present

## 2023-05-04 DIAGNOSIS — R059 Cough, unspecified: Secondary | ICD-10-CM | POA: Diagnosis not present

## 2023-05-04 DIAGNOSIS — J069 Acute upper respiratory infection, unspecified: Secondary | ICD-10-CM | POA: Diagnosis not present

## 2023-05-05 DIAGNOSIS — F03918 Unspecified dementia, unspecified severity, with other behavioral disturbance: Secondary | ICD-10-CM | POA: Diagnosis not present

## 2023-05-05 DIAGNOSIS — R059 Cough, unspecified: Secondary | ICD-10-CM | POA: Diagnosis not present

## 2023-05-06 DIAGNOSIS — J069 Acute upper respiratory infection, unspecified: Secondary | ICD-10-CM | POA: Diagnosis not present

## 2023-05-06 DIAGNOSIS — R059 Cough, unspecified: Secondary | ICD-10-CM | POA: Diagnosis not present

## 2023-05-07 DIAGNOSIS — F03918 Unspecified dementia, unspecified severity, with other behavioral disturbance: Secondary | ICD-10-CM | POA: Diagnosis not present

## 2023-05-12 DIAGNOSIS — F03918 Unspecified dementia, unspecified severity, with other behavioral disturbance: Secondary | ICD-10-CM | POA: Diagnosis not present

## 2023-05-13 DIAGNOSIS — F03918 Unspecified dementia, unspecified severity, with other behavioral disturbance: Secondary | ICD-10-CM | POA: Diagnosis not present

## 2023-05-15 DIAGNOSIS — J069 Acute upper respiratory infection, unspecified: Secondary | ICD-10-CM | POA: Diagnosis not present

## 2023-05-15 DIAGNOSIS — F03918 Unspecified dementia, unspecified severity, with other behavioral disturbance: Secondary | ICD-10-CM | POA: Diagnosis not present

## 2023-05-15 DIAGNOSIS — R059 Cough, unspecified: Secondary | ICD-10-CM | POA: Diagnosis not present

## 2023-05-20 DIAGNOSIS — F03918 Unspecified dementia, unspecified severity, with other behavioral disturbance: Secondary | ICD-10-CM | POA: Diagnosis not present

## 2023-05-22 DIAGNOSIS — F03918 Unspecified dementia, unspecified severity, with other behavioral disturbance: Secondary | ICD-10-CM | POA: Diagnosis not present

## 2023-05-25 DIAGNOSIS — F03918 Unspecified dementia, unspecified severity, with other behavioral disturbance: Secondary | ICD-10-CM | POA: Diagnosis not present

## 2023-05-27 DIAGNOSIS — F03918 Unspecified dementia, unspecified severity, with other behavioral disturbance: Secondary | ICD-10-CM | POA: Diagnosis not present

## 2023-05-29 DIAGNOSIS — F03918 Unspecified dementia, unspecified severity, with other behavioral disturbance: Secondary | ICD-10-CM | POA: Diagnosis not present

## 2023-06-01 DIAGNOSIS — E039 Hypothyroidism, unspecified: Secondary | ICD-10-CM | POA: Diagnosis not present

## 2023-06-01 DIAGNOSIS — E119 Type 2 diabetes mellitus without complications: Secondary | ICD-10-CM | POA: Diagnosis not present

## 2023-06-01 DIAGNOSIS — K5900 Constipation, unspecified: Secondary | ICD-10-CM | POA: Diagnosis not present

## 2023-06-01 DIAGNOSIS — I10 Essential (primary) hypertension: Secondary | ICD-10-CM | POA: Diagnosis not present

## 2023-06-01 DIAGNOSIS — M810 Age-related osteoporosis without current pathological fracture: Secondary | ICD-10-CM | POA: Diagnosis not present

## 2023-06-01 DIAGNOSIS — F03918 Unspecified dementia, unspecified severity, with other behavioral disturbance: Secondary | ICD-10-CM | POA: Diagnosis not present

## 2023-06-02 DIAGNOSIS — F03918 Unspecified dementia, unspecified severity, with other behavioral disturbance: Secondary | ICD-10-CM | POA: Diagnosis not present

## 2023-06-05 DIAGNOSIS — F03918 Unspecified dementia, unspecified severity, with other behavioral disturbance: Secondary | ICD-10-CM | POA: Diagnosis not present

## 2023-06-08 DIAGNOSIS — F03918 Unspecified dementia, unspecified severity, with other behavioral disturbance: Secondary | ICD-10-CM | POA: Diagnosis not present

## 2023-06-09 DIAGNOSIS — F03918 Unspecified dementia, unspecified severity, with other behavioral disturbance: Secondary | ICD-10-CM | POA: Diagnosis not present

## 2023-06-10 DIAGNOSIS — F03918 Unspecified dementia, unspecified severity, with other behavioral disturbance: Secondary | ICD-10-CM | POA: Diagnosis not present

## 2023-06-15 DIAGNOSIS — F03918 Unspecified dementia, unspecified severity, with other behavioral disturbance: Secondary | ICD-10-CM | POA: Diagnosis not present

## 2023-06-17 DIAGNOSIS — L84 Corns and callosities: Secondary | ICD-10-CM | POA: Diagnosis not present

## 2023-06-17 DIAGNOSIS — E1151 Type 2 diabetes mellitus with diabetic peripheral angiopathy without gangrene: Secondary | ICD-10-CM | POA: Diagnosis not present

## 2023-06-17 DIAGNOSIS — F03918 Unspecified dementia, unspecified severity, with other behavioral disturbance: Secondary | ICD-10-CM | POA: Diagnosis not present

## 2023-06-19 DIAGNOSIS — F03918 Unspecified dementia, unspecified severity, with other behavioral disturbance: Secondary | ICD-10-CM | POA: Diagnosis not present

## 2023-06-22 DIAGNOSIS — F03918 Unspecified dementia, unspecified severity, with other behavioral disturbance: Secondary | ICD-10-CM | POA: Diagnosis not present

## 2023-06-25 DIAGNOSIS — M6281 Muscle weakness (generalized): Secondary | ICD-10-CM | POA: Diagnosis not present

## 2023-06-25 DIAGNOSIS — M810 Age-related osteoporosis without current pathological fracture: Secondary | ICD-10-CM | POA: Diagnosis not present

## 2023-06-26 DIAGNOSIS — M810 Age-related osteoporosis without current pathological fracture: Secondary | ICD-10-CM | POA: Diagnosis not present

## 2023-06-26 DIAGNOSIS — M6281 Muscle weakness (generalized): Secondary | ICD-10-CM | POA: Diagnosis not present

## 2023-06-29 DIAGNOSIS — M810 Age-related osteoporosis without current pathological fracture: Secondary | ICD-10-CM | POA: Diagnosis not present

## 2023-06-29 DIAGNOSIS — M6281 Muscle weakness (generalized): Secondary | ICD-10-CM | POA: Diagnosis not present

## 2023-06-30 DIAGNOSIS — M810 Age-related osteoporosis without current pathological fracture: Secondary | ICD-10-CM | POA: Diagnosis not present

## 2023-06-30 DIAGNOSIS — M6281 Muscle weakness (generalized): Secondary | ICD-10-CM | POA: Diagnosis not present

## 2023-07-01 DIAGNOSIS — M810 Age-related osteoporosis without current pathological fracture: Secondary | ICD-10-CM | POA: Diagnosis not present

## 2023-07-01 DIAGNOSIS — M6281 Muscle weakness (generalized): Secondary | ICD-10-CM | POA: Diagnosis not present

## 2023-07-03 DIAGNOSIS — M6281 Muscle weakness (generalized): Secondary | ICD-10-CM | POA: Diagnosis not present

## 2023-07-03 DIAGNOSIS — M810 Age-related osteoporosis without current pathological fracture: Secondary | ICD-10-CM | POA: Diagnosis not present

## 2023-07-06 DIAGNOSIS — E119 Type 2 diabetes mellitus without complications: Secondary | ICD-10-CM | POA: Diagnosis not present

## 2023-07-06 DIAGNOSIS — I10 Essential (primary) hypertension: Secondary | ICD-10-CM | POA: Diagnosis not present

## 2023-07-06 DIAGNOSIS — E785 Hyperlipidemia, unspecified: Secondary | ICD-10-CM | POA: Diagnosis not present

## 2023-07-08 DIAGNOSIS — M6281 Muscle weakness (generalized): Secondary | ICD-10-CM | POA: Diagnosis not present

## 2023-07-08 DIAGNOSIS — M810 Age-related osteoporosis without current pathological fracture: Secondary | ICD-10-CM | POA: Diagnosis not present

## 2023-07-10 DIAGNOSIS — M810 Age-related osteoporosis without current pathological fracture: Secondary | ICD-10-CM | POA: Diagnosis not present

## 2023-07-10 DIAGNOSIS — M6281 Muscle weakness (generalized): Secondary | ICD-10-CM | POA: Diagnosis not present

## 2023-07-13 DIAGNOSIS — M6281 Muscle weakness (generalized): Secondary | ICD-10-CM | POA: Diagnosis not present

## 2023-07-13 DIAGNOSIS — M810 Age-related osteoporosis without current pathological fracture: Secondary | ICD-10-CM | POA: Diagnosis not present

## 2023-07-14 DIAGNOSIS — M810 Age-related osteoporosis without current pathological fracture: Secondary | ICD-10-CM | POA: Diagnosis not present

## 2023-07-14 DIAGNOSIS — M6281 Muscle weakness (generalized): Secondary | ICD-10-CM | POA: Diagnosis not present

## 2023-07-15 DIAGNOSIS — M6281 Muscle weakness (generalized): Secondary | ICD-10-CM | POA: Diagnosis not present

## 2023-07-15 DIAGNOSIS — M810 Age-related osteoporosis without current pathological fracture: Secondary | ICD-10-CM | POA: Diagnosis not present

## 2023-07-16 DIAGNOSIS — M810 Age-related osteoporosis without current pathological fracture: Secondary | ICD-10-CM | POA: Diagnosis not present

## 2023-07-16 DIAGNOSIS — M6281 Muscle weakness (generalized): Secondary | ICD-10-CM | POA: Diagnosis not present

## 2023-07-17 DIAGNOSIS — M810 Age-related osteoporosis without current pathological fracture: Secondary | ICD-10-CM | POA: Diagnosis not present

## 2023-07-17 DIAGNOSIS — M6281 Muscle weakness (generalized): Secondary | ICD-10-CM | POA: Diagnosis not present

## 2023-07-18 DIAGNOSIS — M6281 Muscle weakness (generalized): Secondary | ICD-10-CM | POA: Diagnosis not present

## 2023-07-18 DIAGNOSIS — M810 Age-related osteoporosis without current pathological fracture: Secondary | ICD-10-CM | POA: Diagnosis not present

## 2023-07-20 DIAGNOSIS — M810 Age-related osteoporosis without current pathological fracture: Secondary | ICD-10-CM | POA: Diagnosis not present

## 2023-07-20 DIAGNOSIS — M6281 Muscle weakness (generalized): Secondary | ICD-10-CM | POA: Diagnosis not present

## 2023-07-21 DIAGNOSIS — M6281 Muscle weakness (generalized): Secondary | ICD-10-CM | POA: Diagnosis not present

## 2023-07-21 DIAGNOSIS — M810 Age-related osteoporosis without current pathological fracture: Secondary | ICD-10-CM | POA: Diagnosis not present

## 2023-07-22 DIAGNOSIS — M6281 Muscle weakness (generalized): Secondary | ICD-10-CM | POA: Diagnosis not present

## 2023-07-22 DIAGNOSIS — M810 Age-related osteoporosis without current pathological fracture: Secondary | ICD-10-CM | POA: Diagnosis not present

## 2023-07-23 DIAGNOSIS — M6281 Muscle weakness (generalized): Secondary | ICD-10-CM | POA: Diagnosis not present

## 2023-07-23 DIAGNOSIS — M810 Age-related osteoporosis without current pathological fracture: Secondary | ICD-10-CM | POA: Diagnosis not present

## 2023-07-24 DIAGNOSIS — M810 Age-related osteoporosis without current pathological fracture: Secondary | ICD-10-CM | POA: Diagnosis not present

## 2023-07-24 DIAGNOSIS — M6281 Muscle weakness (generalized): Secondary | ICD-10-CM | POA: Diagnosis not present

## 2023-07-27 DIAGNOSIS — M810 Age-related osteoporosis without current pathological fracture: Secondary | ICD-10-CM | POA: Diagnosis not present

## 2023-07-27 DIAGNOSIS — M6281 Muscle weakness (generalized): Secondary | ICD-10-CM | POA: Diagnosis not present

## 2023-07-28 DIAGNOSIS — M810 Age-related osteoporosis without current pathological fracture: Secondary | ICD-10-CM | POA: Diagnosis not present

## 2023-07-28 DIAGNOSIS — M6281 Muscle weakness (generalized): Secondary | ICD-10-CM | POA: Diagnosis not present

## 2023-07-29 DIAGNOSIS — M6281 Muscle weakness (generalized): Secondary | ICD-10-CM | POA: Diagnosis not present

## 2023-07-29 DIAGNOSIS — M810 Age-related osteoporosis without current pathological fracture: Secondary | ICD-10-CM | POA: Diagnosis not present

## 2023-07-30 DIAGNOSIS — M6281 Muscle weakness (generalized): Secondary | ICD-10-CM | POA: Diagnosis not present

## 2023-07-30 DIAGNOSIS — M810 Age-related osteoporosis without current pathological fracture: Secondary | ICD-10-CM | POA: Diagnosis not present

## 2023-07-31 DIAGNOSIS — M6281 Muscle weakness (generalized): Secondary | ICD-10-CM | POA: Diagnosis not present

## 2023-07-31 DIAGNOSIS — M810 Age-related osteoporosis without current pathological fracture: Secondary | ICD-10-CM | POA: Diagnosis not present

## 2023-08-03 DIAGNOSIS — K5900 Constipation, unspecified: Secondary | ICD-10-CM | POA: Diagnosis not present

## 2023-08-03 DIAGNOSIS — M810 Age-related osteoporosis without current pathological fracture: Secondary | ICD-10-CM | POA: Diagnosis not present

## 2023-08-03 DIAGNOSIS — M6281 Muscle weakness (generalized): Secondary | ICD-10-CM | POA: Diagnosis not present

## 2023-08-03 DIAGNOSIS — E039 Hypothyroidism, unspecified: Secondary | ICD-10-CM | POA: Diagnosis not present

## 2023-08-04 DIAGNOSIS — M810 Age-related osteoporosis without current pathological fracture: Secondary | ICD-10-CM | POA: Diagnosis not present

## 2023-08-04 DIAGNOSIS — M6281 Muscle weakness (generalized): Secondary | ICD-10-CM | POA: Diagnosis not present

## 2023-08-05 DIAGNOSIS — M6281 Muscle weakness (generalized): Secondary | ICD-10-CM | POA: Diagnosis not present

## 2023-08-05 DIAGNOSIS — M810 Age-related osteoporosis without current pathological fracture: Secondary | ICD-10-CM | POA: Diagnosis not present

## 2023-08-06 DIAGNOSIS — M810 Age-related osteoporosis without current pathological fracture: Secondary | ICD-10-CM | POA: Diagnosis not present

## 2023-08-06 DIAGNOSIS — M6281 Muscle weakness (generalized): Secondary | ICD-10-CM | POA: Diagnosis not present

## 2023-08-07 DIAGNOSIS — M810 Age-related osteoporosis without current pathological fracture: Secondary | ICD-10-CM | POA: Diagnosis not present

## 2023-08-07 DIAGNOSIS — M6281 Muscle weakness (generalized): Secondary | ICD-10-CM | POA: Diagnosis not present

## 2023-08-10 DIAGNOSIS — M6281 Muscle weakness (generalized): Secondary | ICD-10-CM | POA: Diagnosis not present

## 2023-08-10 DIAGNOSIS — M810 Age-related osteoporosis without current pathological fracture: Secondary | ICD-10-CM | POA: Diagnosis not present

## 2023-08-11 DIAGNOSIS — M810 Age-related osteoporosis without current pathological fracture: Secondary | ICD-10-CM | POA: Diagnosis not present

## 2023-08-11 DIAGNOSIS — M6281 Muscle weakness (generalized): Secondary | ICD-10-CM | POA: Diagnosis not present

## 2023-08-12 DIAGNOSIS — M6281 Muscle weakness (generalized): Secondary | ICD-10-CM | POA: Diagnosis not present

## 2023-08-12 DIAGNOSIS — M810 Age-related osteoporosis without current pathological fracture: Secondary | ICD-10-CM | POA: Diagnosis not present

## 2023-08-13 DIAGNOSIS — M6281 Muscle weakness (generalized): Secondary | ICD-10-CM | POA: Diagnosis not present

## 2023-08-13 DIAGNOSIS — M810 Age-related osteoporosis without current pathological fracture: Secondary | ICD-10-CM | POA: Diagnosis not present

## 2023-08-14 DIAGNOSIS — M6281 Muscle weakness (generalized): Secondary | ICD-10-CM | POA: Diagnosis not present

## 2023-08-14 DIAGNOSIS — M810 Age-related osteoporosis without current pathological fracture: Secondary | ICD-10-CM | POA: Diagnosis not present

## 2023-08-17 DIAGNOSIS — M810 Age-related osteoporosis without current pathological fracture: Secondary | ICD-10-CM | POA: Diagnosis not present

## 2023-08-17 DIAGNOSIS — M6281 Muscle weakness (generalized): Secondary | ICD-10-CM | POA: Diagnosis not present

## 2023-08-18 DIAGNOSIS — M6281 Muscle weakness (generalized): Secondary | ICD-10-CM | POA: Diagnosis not present

## 2023-08-18 DIAGNOSIS — M810 Age-related osteoporosis without current pathological fracture: Secondary | ICD-10-CM | POA: Diagnosis not present

## 2023-08-19 DIAGNOSIS — M6281 Muscle weakness (generalized): Secondary | ICD-10-CM | POA: Diagnosis not present

## 2023-08-19 DIAGNOSIS — M810 Age-related osteoporosis without current pathological fracture: Secondary | ICD-10-CM | POA: Diagnosis not present

## 2023-08-20 DIAGNOSIS — M810 Age-related osteoporosis without current pathological fracture: Secondary | ICD-10-CM | POA: Diagnosis not present

## 2023-08-20 DIAGNOSIS — M6281 Muscle weakness (generalized): Secondary | ICD-10-CM | POA: Diagnosis not present

## 2023-08-21 DIAGNOSIS — M810 Age-related osteoporosis without current pathological fracture: Secondary | ICD-10-CM | POA: Diagnosis not present

## 2023-08-21 DIAGNOSIS — M6281 Muscle weakness (generalized): Secondary | ICD-10-CM | POA: Diagnosis not present

## 2023-08-24 DIAGNOSIS — M810 Age-related osteoporosis without current pathological fracture: Secondary | ICD-10-CM | POA: Diagnosis not present

## 2023-08-24 DIAGNOSIS — M6281 Muscle weakness (generalized): Secondary | ICD-10-CM | POA: Diagnosis not present

## 2023-08-25 DIAGNOSIS — M810 Age-related osteoporosis without current pathological fracture: Secondary | ICD-10-CM | POA: Diagnosis not present

## 2023-08-25 DIAGNOSIS — M6281 Muscle weakness (generalized): Secondary | ICD-10-CM | POA: Diagnosis not present

## 2023-08-26 DIAGNOSIS — M6281 Muscle weakness (generalized): Secondary | ICD-10-CM | POA: Diagnosis not present

## 2023-08-26 DIAGNOSIS — M810 Age-related osteoporosis without current pathological fracture: Secondary | ICD-10-CM | POA: Diagnosis not present

## 2023-08-27 DIAGNOSIS — M810 Age-related osteoporosis without current pathological fracture: Secondary | ICD-10-CM | POA: Diagnosis not present

## 2023-08-27 DIAGNOSIS — M6281 Muscle weakness (generalized): Secondary | ICD-10-CM | POA: Diagnosis not present

## 2023-08-28 DIAGNOSIS — M6281 Muscle weakness (generalized): Secondary | ICD-10-CM | POA: Diagnosis not present

## 2023-08-28 DIAGNOSIS — M810 Age-related osteoporosis without current pathological fracture: Secondary | ICD-10-CM | POA: Diagnosis not present

## 2023-08-31 DIAGNOSIS — E785 Hyperlipidemia, unspecified: Secondary | ICD-10-CM | POA: Diagnosis not present

## 2023-08-31 DIAGNOSIS — I10 Essential (primary) hypertension: Secondary | ICD-10-CM | POA: Diagnosis not present

## 2023-08-31 DIAGNOSIS — M6281 Muscle weakness (generalized): Secondary | ICD-10-CM | POA: Diagnosis not present

## 2023-08-31 DIAGNOSIS — E119 Type 2 diabetes mellitus without complications: Secondary | ICD-10-CM | POA: Diagnosis not present

## 2023-08-31 DIAGNOSIS — M810 Age-related osteoporosis without current pathological fracture: Secondary | ICD-10-CM | POA: Diagnosis not present

## 2023-09-02 DIAGNOSIS — M6281 Muscle weakness (generalized): Secondary | ICD-10-CM | POA: Diagnosis not present

## 2023-09-02 DIAGNOSIS — M810 Age-related osteoporosis without current pathological fracture: Secondary | ICD-10-CM | POA: Diagnosis not present

## 2023-09-03 DIAGNOSIS — E1151 Type 2 diabetes mellitus with diabetic peripheral angiopathy without gangrene: Secondary | ICD-10-CM | POA: Diagnosis not present

## 2023-09-03 DIAGNOSIS — M810 Age-related osteoporosis without current pathological fracture: Secondary | ICD-10-CM | POA: Diagnosis not present

## 2023-09-03 DIAGNOSIS — L84 Corns and callosities: Secondary | ICD-10-CM | POA: Diagnosis not present

## 2023-09-03 DIAGNOSIS — M6281 Muscle weakness (generalized): Secondary | ICD-10-CM | POA: Diagnosis not present

## 2023-09-04 DIAGNOSIS — M810 Age-related osteoporosis without current pathological fracture: Secondary | ICD-10-CM | POA: Diagnosis not present

## 2023-09-04 DIAGNOSIS — M6281 Muscle weakness (generalized): Secondary | ICD-10-CM | POA: Diagnosis not present

## 2023-09-05 DIAGNOSIS — E039 Hypothyroidism, unspecified: Secondary | ICD-10-CM | POA: Diagnosis not present

## 2023-09-05 DIAGNOSIS — K219 Gastro-esophageal reflux disease without esophagitis: Secondary | ICD-10-CM | POA: Diagnosis not present

## 2023-09-05 DIAGNOSIS — E1169 Type 2 diabetes mellitus with other specified complication: Secondary | ICD-10-CM | POA: Diagnosis not present

## 2023-09-05 DIAGNOSIS — E785 Hyperlipidemia, unspecified: Secondary | ICD-10-CM | POA: Diagnosis not present

## 2023-09-05 DIAGNOSIS — M810 Age-related osteoporosis without current pathological fracture: Secondary | ICD-10-CM | POA: Diagnosis not present

## 2023-09-05 DIAGNOSIS — I10 Essential (primary) hypertension: Secondary | ICD-10-CM | POA: Diagnosis not present

## 2023-09-07 DIAGNOSIS — M6281 Muscle weakness (generalized): Secondary | ICD-10-CM | POA: Diagnosis not present

## 2023-09-07 DIAGNOSIS — M810 Age-related osteoporosis without current pathological fracture: Secondary | ICD-10-CM | POA: Diagnosis not present

## 2023-09-08 DIAGNOSIS — M810 Age-related osteoporosis without current pathological fracture: Secondary | ICD-10-CM | POA: Diagnosis not present

## 2023-09-08 DIAGNOSIS — M6281 Muscle weakness (generalized): Secondary | ICD-10-CM | POA: Diagnosis not present

## 2023-09-09 DIAGNOSIS — M6281 Muscle weakness (generalized): Secondary | ICD-10-CM | POA: Diagnosis not present

## 2023-09-09 DIAGNOSIS — M810 Age-related osteoporosis without current pathological fracture: Secondary | ICD-10-CM | POA: Diagnosis not present

## 2023-09-10 DIAGNOSIS — M6281 Muscle weakness (generalized): Secondary | ICD-10-CM | POA: Diagnosis not present

## 2023-09-10 DIAGNOSIS — M810 Age-related osteoporosis without current pathological fracture: Secondary | ICD-10-CM | POA: Diagnosis not present

## 2023-09-11 DIAGNOSIS — M6281 Muscle weakness (generalized): Secondary | ICD-10-CM | POA: Diagnosis not present

## 2023-09-11 DIAGNOSIS — M810 Age-related osteoporosis without current pathological fracture: Secondary | ICD-10-CM | POA: Diagnosis not present

## 2023-09-14 DIAGNOSIS — M810 Age-related osteoporosis without current pathological fracture: Secondary | ICD-10-CM | POA: Diagnosis not present

## 2023-09-14 DIAGNOSIS — M6281 Muscle weakness (generalized): Secondary | ICD-10-CM | POA: Diagnosis not present

## 2023-09-15 DIAGNOSIS — M810 Age-related osteoporosis without current pathological fracture: Secondary | ICD-10-CM | POA: Diagnosis not present

## 2023-09-15 DIAGNOSIS — M6281 Muscle weakness (generalized): Secondary | ICD-10-CM | POA: Diagnosis not present

## 2023-09-16 DIAGNOSIS — M810 Age-related osteoporosis without current pathological fracture: Secondary | ICD-10-CM | POA: Diagnosis not present

## 2023-09-16 DIAGNOSIS — M6281 Muscle weakness (generalized): Secondary | ICD-10-CM | POA: Diagnosis not present

## 2023-09-17 DIAGNOSIS — M810 Age-related osteoporosis without current pathological fracture: Secondary | ICD-10-CM | POA: Diagnosis not present

## 2023-09-17 DIAGNOSIS — M6281 Muscle weakness (generalized): Secondary | ICD-10-CM | POA: Diagnosis not present

## 2023-09-20 DIAGNOSIS — M6281 Muscle weakness (generalized): Secondary | ICD-10-CM | POA: Diagnosis not present

## 2023-09-20 DIAGNOSIS — M810 Age-related osteoporosis without current pathological fracture: Secondary | ICD-10-CM | POA: Diagnosis not present

## 2023-09-21 DIAGNOSIS — M6281 Muscle weakness (generalized): Secondary | ICD-10-CM | POA: Diagnosis not present

## 2023-09-21 DIAGNOSIS — M810 Age-related osteoporosis without current pathological fracture: Secondary | ICD-10-CM | POA: Diagnosis not present

## 2023-09-22 DIAGNOSIS — M810 Age-related osteoporosis without current pathological fracture: Secondary | ICD-10-CM | POA: Diagnosis not present

## 2023-09-22 DIAGNOSIS — M6281 Muscle weakness (generalized): Secondary | ICD-10-CM | POA: Diagnosis not present

## 2023-09-23 DIAGNOSIS — M6281 Muscle weakness (generalized): Secondary | ICD-10-CM | POA: Diagnosis not present

## 2023-09-23 DIAGNOSIS — M810 Age-related osteoporosis without current pathological fracture: Secondary | ICD-10-CM | POA: Diagnosis not present

## 2023-09-24 DIAGNOSIS — M810 Age-related osteoporosis without current pathological fracture: Secondary | ICD-10-CM | POA: Diagnosis not present

## 2023-09-24 DIAGNOSIS — M6281 Muscle weakness (generalized): Secondary | ICD-10-CM | POA: Diagnosis not present

## 2023-09-25 DIAGNOSIS — M6281 Muscle weakness (generalized): Secondary | ICD-10-CM | POA: Diagnosis not present

## 2023-09-25 DIAGNOSIS — M810 Age-related osteoporosis without current pathological fracture: Secondary | ICD-10-CM | POA: Diagnosis not present

## 2023-09-28 DIAGNOSIS — M6281 Muscle weakness (generalized): Secondary | ICD-10-CM | POA: Diagnosis not present

## 2023-09-28 DIAGNOSIS — M810 Age-related osteoporosis without current pathological fracture: Secondary | ICD-10-CM | POA: Diagnosis not present

## 2023-09-29 DIAGNOSIS — M810 Age-related osteoporosis without current pathological fracture: Secondary | ICD-10-CM | POA: Diagnosis not present

## 2023-09-29 DIAGNOSIS — M6281 Muscle weakness (generalized): Secondary | ICD-10-CM | POA: Diagnosis not present

## 2023-09-30 DIAGNOSIS — M810 Age-related osteoporosis without current pathological fracture: Secondary | ICD-10-CM | POA: Diagnosis not present

## 2023-09-30 DIAGNOSIS — M6281 Muscle weakness (generalized): Secondary | ICD-10-CM | POA: Diagnosis not present

## 2023-10-02 DIAGNOSIS — M810 Age-related osteoporosis without current pathological fracture: Secondary | ICD-10-CM | POA: Diagnosis not present

## 2023-10-02 DIAGNOSIS — M6281 Muscle weakness (generalized): Secondary | ICD-10-CM | POA: Diagnosis not present

## 2023-10-05 DIAGNOSIS — E039 Hypothyroidism, unspecified: Secondary | ICD-10-CM | POA: Diagnosis not present

## 2023-10-05 DIAGNOSIS — M810 Age-related osteoporosis without current pathological fracture: Secondary | ICD-10-CM | POA: Diagnosis not present

## 2023-10-05 DIAGNOSIS — M6281 Muscle weakness (generalized): Secondary | ICD-10-CM | POA: Diagnosis not present

## 2023-10-05 DIAGNOSIS — K5909 Other constipation: Secondary | ICD-10-CM | POA: Diagnosis not present

## 2023-10-06 DIAGNOSIS — M810 Age-related osteoporosis without current pathological fracture: Secondary | ICD-10-CM | POA: Diagnosis not present

## 2023-10-06 DIAGNOSIS — M6281 Muscle weakness (generalized): Secondary | ICD-10-CM | POA: Diagnosis not present

## 2023-10-07 DIAGNOSIS — M6281 Muscle weakness (generalized): Secondary | ICD-10-CM | POA: Diagnosis not present

## 2023-10-07 DIAGNOSIS — M810 Age-related osteoporosis without current pathological fracture: Secondary | ICD-10-CM | POA: Diagnosis not present

## 2023-10-09 DIAGNOSIS — M810 Age-related osteoporosis without current pathological fracture: Secondary | ICD-10-CM | POA: Diagnosis not present

## 2023-10-09 DIAGNOSIS — M6281 Muscle weakness (generalized): Secondary | ICD-10-CM | POA: Diagnosis not present

## 2023-10-12 DIAGNOSIS — M810 Age-related osteoporosis without current pathological fracture: Secondary | ICD-10-CM | POA: Diagnosis not present

## 2023-10-12 DIAGNOSIS — M6281 Muscle weakness (generalized): Secondary | ICD-10-CM | POA: Diagnosis not present

## 2023-10-13 DIAGNOSIS — M810 Age-related osteoporosis without current pathological fracture: Secondary | ICD-10-CM | POA: Diagnosis not present

## 2023-10-13 DIAGNOSIS — M6281 Muscle weakness (generalized): Secondary | ICD-10-CM | POA: Diagnosis not present

## 2023-10-14 DIAGNOSIS — M810 Age-related osteoporosis without current pathological fracture: Secondary | ICD-10-CM | POA: Diagnosis not present

## 2023-10-14 DIAGNOSIS — M6281 Muscle weakness (generalized): Secondary | ICD-10-CM | POA: Diagnosis not present

## 2023-10-15 DIAGNOSIS — M6281 Muscle weakness (generalized): Secondary | ICD-10-CM | POA: Diagnosis not present

## 2023-10-15 DIAGNOSIS — M810 Age-related osteoporosis without current pathological fracture: Secondary | ICD-10-CM | POA: Diagnosis not present

## 2023-10-16 DIAGNOSIS — M810 Age-related osteoporosis without current pathological fracture: Secondary | ICD-10-CM | POA: Diagnosis not present

## 2023-10-16 DIAGNOSIS — M6281 Muscle weakness (generalized): Secondary | ICD-10-CM | POA: Diagnosis not present

## 2023-10-19 DIAGNOSIS — M810 Age-related osteoporosis without current pathological fracture: Secondary | ICD-10-CM | POA: Diagnosis not present

## 2023-10-19 DIAGNOSIS — M6281 Muscle weakness (generalized): Secondary | ICD-10-CM | POA: Diagnosis not present

## 2023-10-20 DIAGNOSIS — M6281 Muscle weakness (generalized): Secondary | ICD-10-CM | POA: Diagnosis not present

## 2023-10-20 DIAGNOSIS — M810 Age-related osteoporosis without current pathological fracture: Secondary | ICD-10-CM | POA: Diagnosis not present

## 2023-10-21 DIAGNOSIS — M810 Age-related osteoporosis without current pathological fracture: Secondary | ICD-10-CM | POA: Diagnosis not present

## 2023-10-21 DIAGNOSIS — M6281 Muscle weakness (generalized): Secondary | ICD-10-CM | POA: Diagnosis not present

## 2023-11-02 DIAGNOSIS — I10 Essential (primary) hypertension: Secondary | ICD-10-CM | POA: Diagnosis not present

## 2023-11-02 DIAGNOSIS — E785 Hyperlipidemia, unspecified: Secondary | ICD-10-CM | POA: Diagnosis not present

## 2023-11-02 DIAGNOSIS — K219 Gastro-esophageal reflux disease without esophagitis: Secondary | ICD-10-CM | POA: Diagnosis not present

## 2023-11-11 DIAGNOSIS — K5909 Other constipation: Secondary | ICD-10-CM | POA: Diagnosis not present

## 2023-11-11 DIAGNOSIS — M810 Age-related osteoporosis without current pathological fracture: Secondary | ICD-10-CM | POA: Diagnosis not present

## 2023-11-11 DIAGNOSIS — E039 Hypothyroidism, unspecified: Secondary | ICD-10-CM | POA: Diagnosis not present

## 2023-11-26 DIAGNOSIS — K219 Gastro-esophageal reflux disease without esophagitis: Secondary | ICD-10-CM | POA: Diagnosis not present

## 2023-11-26 DIAGNOSIS — E1169 Type 2 diabetes mellitus with other specified complication: Secondary | ICD-10-CM | POA: Diagnosis not present

## 2023-11-26 DIAGNOSIS — D649 Anemia, unspecified: Secondary | ICD-10-CM | POA: Diagnosis not present

## 2023-11-26 DIAGNOSIS — I10 Essential (primary) hypertension: Secondary | ICD-10-CM | POA: Diagnosis not present

## 2023-11-26 DIAGNOSIS — K5909 Other constipation: Secondary | ICD-10-CM | POA: Diagnosis not present

## 2023-11-26 DIAGNOSIS — M810 Age-related osteoporosis without current pathological fracture: Secondary | ICD-10-CM | POA: Diagnosis not present

## 2023-12-01 DIAGNOSIS — E1151 Type 2 diabetes mellitus with diabetic peripheral angiopathy without gangrene: Secondary | ICD-10-CM | POA: Diagnosis not present

## 2023-12-01 DIAGNOSIS — L84 Corns and callosities: Secondary | ICD-10-CM | POA: Diagnosis not present

## 2023-12-07 DIAGNOSIS — E782 Mixed hyperlipidemia: Secondary | ICD-10-CM | POA: Diagnosis not present

## 2023-12-07 DIAGNOSIS — I10 Essential (primary) hypertension: Secondary | ICD-10-CM | POA: Diagnosis not present

## 2023-12-07 DIAGNOSIS — E1169 Type 2 diabetes mellitus with other specified complication: Secondary | ICD-10-CM | POA: Diagnosis not present

## 2024-01-04 DIAGNOSIS — E039 Hypothyroidism, unspecified: Secondary | ICD-10-CM | POA: Diagnosis not present

## 2024-01-04 DIAGNOSIS — K5909 Other constipation: Secondary | ICD-10-CM | POA: Diagnosis not present

## 2024-01-04 DIAGNOSIS — M810 Age-related osteoporosis without current pathological fracture: Secondary | ICD-10-CM | POA: Diagnosis not present

## 2024-01-04 DIAGNOSIS — R051 Acute cough: Secondary | ICD-10-CM | POA: Diagnosis not present

## 2024-01-04 DIAGNOSIS — J811 Chronic pulmonary edema: Secondary | ICD-10-CM | POA: Diagnosis not present

## 2024-01-06 ENCOUNTER — Observation Stay (HOSPITAL_COMMUNITY)
Admission: EM | Admit: 2024-01-06 | Discharge: 2024-01-07 | Disposition: A | Discharge status: Skilled Nursing Facility | Payer: Medicare Other | Attending: Family Medicine | Admitting: Family Medicine

## 2024-01-06 ENCOUNTER — Emergency Department (HOSPITAL_COMMUNITY): Payer: Medicare Other

## 2024-01-06 DIAGNOSIS — R531 Weakness: Secondary | ICD-10-CM | POA: Diagnosis not present

## 2024-01-06 DIAGNOSIS — I110 Hypertensive heart disease with heart failure: Secondary | ICD-10-CM | POA: Insufficient documentation

## 2024-01-06 DIAGNOSIS — F0390 Unspecified dementia without behavioral disturbance: Secondary | ICD-10-CM | POA: Insufficient documentation

## 2024-01-06 DIAGNOSIS — J210 Acute bronchiolitis due to respiratory syncytial virus: Secondary | ICD-10-CM | POA: Diagnosis not present

## 2024-01-06 DIAGNOSIS — J069 Acute upper respiratory infection, unspecified: Secondary | ICD-10-CM | POA: Insufficient documentation

## 2024-01-06 DIAGNOSIS — R0602 Shortness of breath: Secondary | ICD-10-CM | POA: Diagnosis not present

## 2024-01-06 DIAGNOSIS — Z7984 Long term (current) use of oral hypoglycemic drugs: Secondary | ICD-10-CM | POA: Diagnosis not present

## 2024-01-06 DIAGNOSIS — R0902 Hypoxemia: Principal | ICD-10-CM | POA: Insufficient documentation

## 2024-01-06 DIAGNOSIS — Z7982 Long term (current) use of aspirin: Secondary | ICD-10-CM | POA: Diagnosis not present

## 2024-01-06 DIAGNOSIS — B974 Respiratory syncytial virus as the cause of diseases classified elsewhere: Secondary | ICD-10-CM | POA: Diagnosis not present

## 2024-01-06 DIAGNOSIS — R0989 Other specified symptoms and signs involving the circulatory and respiratory systems: Secondary | ICD-10-CM | POA: Diagnosis not present

## 2024-01-06 DIAGNOSIS — Z79899 Other long term (current) drug therapy: Secondary | ICD-10-CM | POA: Diagnosis not present

## 2024-01-06 DIAGNOSIS — E039 Hypothyroidism, unspecified: Secondary | ICD-10-CM | POA: Insufficient documentation

## 2024-01-06 DIAGNOSIS — Z743 Need for continuous supervision: Secondary | ICD-10-CM | POA: Diagnosis not present

## 2024-01-06 DIAGNOSIS — Z96652 Presence of left artificial knee joint: Secondary | ICD-10-CM | POA: Insufficient documentation

## 2024-01-06 DIAGNOSIS — R062 Wheezing: Secondary | ICD-10-CM | POA: Diagnosis not present

## 2024-01-06 DIAGNOSIS — Z87891 Personal history of nicotine dependence: Secondary | ICD-10-CM | POA: Diagnosis not present

## 2024-01-06 DIAGNOSIS — I509 Heart failure, unspecified: Secondary | ICD-10-CM | POA: Insufficient documentation

## 2024-01-06 DIAGNOSIS — E119 Type 2 diabetes mellitus without complications: Secondary | ICD-10-CM | POA: Diagnosis not present

## 2024-01-06 DIAGNOSIS — R051 Acute cough: Secondary | ICD-10-CM | POA: Diagnosis not present

## 2024-01-06 DIAGNOSIS — R059 Cough, unspecified: Secondary | ICD-10-CM | POA: Diagnosis not present

## 2024-01-06 DIAGNOSIS — Z1152 Encounter for screening for COVID-19: Secondary | ICD-10-CM | POA: Diagnosis not present

## 2024-01-06 DIAGNOSIS — Z85038 Personal history of other malignant neoplasm of large intestine: Secondary | ICD-10-CM | POA: Diagnosis not present

## 2024-01-06 DIAGNOSIS — B338 Other specified viral diseases: Principal | ICD-10-CM

## 2024-01-06 LAB — URINALYSIS, ROUTINE W REFLEX MICROSCOPIC
Bilirubin Urine: NEGATIVE
Glucose, UA: NEGATIVE mg/dL
Hgb urine dipstick: NEGATIVE
Ketones, ur: NEGATIVE mg/dL
Nitrite: NEGATIVE
Protein, ur: 30 mg/dL — AB
Specific Gravity, Urine: 1.028 (ref 1.005–1.030)
pH: 5 (ref 5.0–8.0)

## 2024-01-06 LAB — RESP PANEL BY RT-PCR (RSV, FLU A&B, COVID)  RVPGX2
Influenza A by PCR: NEGATIVE
Influenza B by PCR: NEGATIVE
Resp Syncytial Virus by PCR: POSITIVE — AB
SARS Coronavirus 2 by RT PCR: NEGATIVE

## 2024-01-06 LAB — BASIC METABOLIC PANEL
Anion gap: 13 (ref 5–15)
BUN: 9 mg/dL (ref 8–23)
CO2: 26 mmol/L (ref 22–32)
Calcium: 8.5 mg/dL — ABNORMAL LOW (ref 8.9–10.3)
Chloride: 100 mmol/L (ref 98–111)
Creatinine, Ser: 0.96 mg/dL (ref 0.44–1.00)
GFR, Estimated: 57 mL/min — ABNORMAL LOW (ref >60)
Glucose, Bld: 155 mg/dL — ABNORMAL HIGH (ref 70–99)
Potassium: 3.8 mmol/L (ref 3.5–5.1)
Sodium: 139 mmol/L (ref 135–145)

## 2024-01-06 LAB — HEPATIC FUNCTION PANEL
ALT: 17 U/L (ref 0–44)
AST: 26 U/L (ref 15–41)
Albumin: 3.1 g/dL — ABNORMAL LOW (ref 3.5–5.0)
Alkaline Phosphatase: 56 U/L (ref 38–126)
Bilirubin, Direct: 0.3 mg/dL — ABNORMAL HIGH (ref 0.0–0.2)
Indirect Bilirubin: 0.5 mg/dL (ref 0.3–0.9)
Total Bilirubin: 0.8 mg/dL (ref 0.0–1.2)
Total Protein: 7 g/dL (ref 6.5–8.1)

## 2024-01-06 LAB — CBC WITH DIFFERENTIAL/PLATELET
Abs Immature Granulocytes: 0.04 10*3/uL (ref 0.00–0.07)
Basophils Absolute: 0 10*3/uL (ref 0.0–0.1)
Basophils Relative: 1 %
Eosinophils Absolute: 0.1 10*3/uL (ref 0.0–0.5)
Eosinophils Relative: 2 %
HCT: 37.7 % (ref 36.0–46.0)
Hemoglobin: 12.4 g/dL (ref 12.0–15.0)
Immature Granulocytes: 1 %
Lymphocytes Relative: 16 %
Lymphs Abs: 1.1 10*3/uL (ref 0.7–4.0)
MCH: 29.7 pg (ref 26.0–34.0)
MCHC: 32.9 g/dL (ref 30.0–36.0)
MCV: 90.2 fL (ref 80.0–100.0)
Monocytes Absolute: 0.6 10*3/uL (ref 0.1–1.0)
Monocytes Relative: 10 %
Neutro Abs: 4.7 10*3/uL (ref 1.7–7.7)
Neutrophils Relative %: 70 %
Platelets: 227 10*3/uL (ref 150–400)
RBC: 4.18 MIL/uL (ref 3.87–5.11)
RDW: 13.4 % (ref 11.5–15.5)
WBC: 6.6 10*3/uL (ref 4.0–10.5)
nRBC: 0 % (ref 0.0–0.2)

## 2024-01-06 LAB — LIPASE, BLOOD: Lipase: 25 U/L (ref 11–51)

## 2024-01-06 LAB — BRAIN NATRIURETIC PEPTIDE: B Natriuretic Peptide: 200 pg/mL — ABNORMAL HIGH (ref 0.0–100.0)

## 2024-01-06 MED ORDER — DONEPEZIL HCL 5 MG PO TABS
10.0000 mg | ORAL_TABLET | Freq: Every day | ORAL | Status: DC
  Administered 2024-01-07: 10 mg via ORAL
  Filled 2024-01-06: qty 2

## 2024-01-06 MED ORDER — PANTOPRAZOLE SODIUM 40 MG PO TBEC
40.0000 mg | DELAYED_RELEASE_TABLET | Freq: Every day | ORAL | Status: DC
  Administered 2024-01-06 – 2024-01-07 (×2): 40 mg via ORAL
  Filled 2024-01-06 (×2): qty 1

## 2024-01-06 MED ORDER — SODIUM CHLORIDE 0.9 % IV BOLUS
500.0000 mL | Freq: Once | INTRAVENOUS | Status: AC
  Administered 2024-01-06: 500 mL via INTRAVENOUS

## 2024-01-06 MED ORDER — IPRATROPIUM-ALBUTEROL 0.5-2.5 (3) MG/3ML IN SOLN
3.0000 mL | Freq: Once | RESPIRATORY_TRACT | Status: AC
  Administered 2024-01-06: 3 mL via RESPIRATORY_TRACT
  Filled 2024-01-06: qty 3

## 2024-01-06 MED ORDER — FUROSEMIDE 40 MG PO TABS
60.0000 mg | ORAL_TABLET | Freq: Every day | ORAL | Status: DC
  Administered 2024-01-06 – 2024-01-07 (×2): 60 mg via ORAL
  Filled 2024-01-06: qty 1
  Filled 2024-01-06: qty 2

## 2024-01-06 MED ORDER — HEPARIN SODIUM (PORCINE) 5000 UNIT/ML IJ SOLN
5000.0000 [IU] | Freq: Three times a day (TID) | INTRAMUSCULAR | Status: DC
  Administered 2024-01-06 – 2024-01-07 (×2): 5000 [IU] via SUBCUTANEOUS
  Filled 2024-01-06 (×3): qty 1

## 2024-01-06 MED ORDER — HYDRALAZINE HCL 20 MG/ML IJ SOLN: 5.0000 mg | INTRAMUSCULAR | Status: DC | PRN

## 2024-01-06 MED ORDER — METOPROLOL SUCCINATE ER 50 MG PO TB24
50.0000 mg | ORAL_TABLET | Freq: Every day | ORAL | Status: DC
  Administered 2024-01-06 – 2024-01-07 (×2): 50 mg via ORAL
  Filled 2024-01-06 (×2): qty 1

## 2024-01-06 MED ORDER — IPRATROPIUM-ALBUTEROL 0.5-2.5 (3) MG/3ML IN SOLN
3.0000 mL | RESPIRATORY_TRACT | Status: DC | PRN
  Administered 2024-01-06: 3 mL via RESPIRATORY_TRACT
  Filled 2024-01-06: qty 3

## 2024-01-06 MED ORDER — LEVOTHYROXINE SODIUM 75 MCG PO TABS
75.0000 ug | ORAL_TABLET | Freq: Every day | ORAL | Status: DC
  Administered 2024-01-07: 75 ug via ORAL
  Filled 2024-01-06: qty 1

## 2024-01-06 NOTE — ED Notes (Signed)
Unable to ambulate pt with pulse ox at this time. Family stated she has been bed bound for months because PT has stopped.

## 2024-01-06 NOTE — ED Triage Notes (Signed)
Pt arrives via [facility] from [facility] for URI for one week. Symptoms include cough, congestion, potentially increasing swelling in her legs. VS WNL during transport with EMS.

## 2024-01-06 NOTE — ED Notes (Addendum)
Pt has expiratory wheezing. Respirations are WNL. RT was notified.

## 2024-01-06 NOTE — H&P (Signed)
[facility] HPI   KELIAH HARNED ZOX:[id number] DOB: 07-18-1935 DOA: 01/06/2024 From: Dorina Hoyer code Status DNR  PCP: Benita Stabile, MD   Chief Complaint: Cough poor appetite  HPI:  89 year old black female [facility] resident for the past year--she is nonambulatory at baseline HTN DM TY 2  She has underlying dementia at least moderate and has occasional difficulty recognizing her daughter and daughter states that dementia goes back to about 5 years prior to admission  She was recently treated at [facility] with some type of antibiotic and has had poor appetite over the past several weeks and has become increasingly swollen over the past month When she came to emergency room she was transiently hypoxic into the 8788 range but I believe this was a pleth error  I am unable to obtain ROS from the patient personally   Review of Systems:  As mentioned above in HPI are pertinent +'s Pertinent negatives as per below   ED Course: Patient was given IV fluid bolus and I stopped this given her report of possible heart failure   Past Medical History:  Diagnosis Date   Anemia    Arthritis    Cataracts, bilateral    pending surgery May 2013   Colon cancer [facility]) 1994   Stage IIB   Diabetes (HCC)    Diverticulosis    GERD (gastroesophageal reflux disease)    HTN (hypertension)    Hyperlipidemia    Hypothyroidism    Migraines    occasional migraines   Osteoporosis    Past Surgical History:  Procedure Laterality Date   CATARACT EXTRACTION W/PHACO Left 08/01/2013   Procedure: CATARACT EXTRACTION PHACO AND INTRAOCULAR LENS PLACEMENT (IOC);  Surgeon: Gemma Payor, MD;  Location: [facility];  Service: Ophthalmology;  Laterality: Left;  CDE:11.47   CATARACT EXTRACTION W/PHACO Right 08/15/2013   Procedure: CATARACT EXTRACTION PHACO AND INTRAOCULAR LENS PLACEMENT (IOC);  Surgeon: Gemma Payor, MD;  Location: [facility];  Service: Ophthalmology;  Laterality: Right;  CDE:  16.81   COLON  SURGERY  1994   Right   COLONOSCOPY  04/01/2012   Rourk-Colonic ulcers at the anastomosis likely NSAID related, although ischemia is not excluded, status post biopsy, left-sided diverticula.  Internal hemorrhoids and anal papilla   COLONOSCOPY W/ BIOPSIES  05/04/09   Dr. Marlowe Alt papilla, diverticulosis   DILATION AND CURETTAGE OF UTERUS     ENDOSCOPIC RETROGRADE CHOLANGIOPANCREATOGRAPHY (ERCP) WITH PROPOFOL N/A 07/30/2022   Procedure: ENDOSCOPIC RETROGRADE CHOLANGIOPANCREATOGRAPHY (ERCP) WITH PROPOFOL;  Surgeon: Meryl Dare, MD;  Location: [facility] ENDOSCOPY;  Service: Gastroenterology;  Laterality: N/A;   ESOPHAGOGASTRODUODENOSCOPY  04/01/2012   Rourk-Normal esophagus, small hiatal hernia, deformity, scarring friability of the antrum/prepyloric mucosa suggestive of prior peptic ulcer disease, normal D1 and D2.   HEMORRHOID SURGERY     knee arthrocopy(Right)     REMOVAL OF STONES  07/30/2022   Procedure: REMOVAL OF STONES;  Surgeon: Meryl Dare, MD;  Location: [facility] ENDOSCOPY;  Service: Gastroenterology;;   Dennison Mascot  07/30/2022   Procedure: SPHINCTEROTOMY;  Surgeon: Meryl Dare, MD;  Location: [facility] ENDOSCOPY;  Service: Gastroenterology;;   TOTAL KNEE ARTHROPLASTY Left 03/18/2017   Procedure: TOTAL KNEE ARTHROPLASTY;  Surgeon: Vickki Hearing, MD;  Location: [facility];  Service: Orthopedics;  Laterality: Left;    reports that she quit smoking about 31 years ago. Her smoking use included cigarettes. She started smoking about 41 years ago. She has a 5 pack-year smoking history. She quit smokeless tobacco use about 31  years ago. She reports current alcohol use. She reports that she does not use drugs.  Mobility: Independent  No Known Allergies Family History  Problem Relation Age of Onset   Leukemia Mother 42   Hypertension Mother    Heart failure Father 61   Coronary artery disease Sister        x2   Stroke Son    Prior to Admission medications   Medication Sig Start Date End  Date Taking? Authorizing Provider  acetaminophen (TYLENOL) 325 MG tablet Take 325 mg by mouth daily after lunch.     [provider]  alendronate (FOSAMAX) 70 MG tablet Take 70 mg by mouth every Monday.  11/21/16   [provider]  aspirin EC 325 MG EC tablet Take 1 tablet (325 mg total) by mouth daily with breakfast. 03/21/17   Vickki Hearing, MD  cholecalciferol (VITAMIN D) 1000 UNITS tablet Take 1,000 Units by mouth daily after lunch.     [provider]  docusate sodium (COLACE) 100 MG capsule Take 1 capsule (100 mg total) by mouth 2 (two) times daily as needed for mild constipation. 01/27/23   Vassie Loll, MD  donepezil (ARICEPT) 10 MG tablet Take 10 mg by mouth daily. 01/15/23   [provider]  furosemide (LASIX) 20 MG tablet Take 2 tablets (40 mg total) by mouth daily as needed for fluid or edema. 01/27/23   Vassie Loll, MD  levothyroxine (SYNTHROID) 75 MCG tablet Take 75 mcg by mouth every morning. 02/11/21   [provider]  metFORMIN (GLUCOPHAGE-XR) 500 MG 24 hr tablet Take 1 tablet (500 mg total) by mouth daily with breakfast. 01/27/23   Vassie Loll, MD  metoprolol succinate (TOPROL-XL) 100 MG 24 hr tablet Take 1 tablet (100 mg total) by mouth daily. 01/27/23   Vassie Loll, MD  Multiple Vitamins-Minerals [facility] VITAMIN PO) Take 1 tablet by mouth daily after lunch.     [provider]  omeprazole (PRILOSEC) 20 MG capsule Take 20 mg by mouth daily after breakfast. (0800)    [provider]  potassium chloride (MICRO-K) 10 MEQ CR capsule Take 10 mEq by mouth daily. 04/10/21   [provider]  raloxifene (EVISTA) 60 MG tablet Take 60 mg by mouth daily after breakfast. (0800)    [provider]  simvastatin (ZOCOR) 40 MG tablet Take 40 mg by mouth at bedtime.     [provider]  telmisartan (MICARDIS) 80 MG tablet Take 0.5 tablets (40 mg total) by mouth daily. 01/27/23   Vassie Loll, MD     Physical Exam:  Vitals:   01/06/24 1415 01/06/24 1430  BP: 101/61 102/89  Pulse: 81 83  Resp: (!) 22 19  Temp:    SpO2: 92% 91%    Coherent black female no distress looks about stated age morbidly obese Neck soft supple cannot appreciate JVD on my exam she is able to follow commands cursor earlier She has not Mallampati 4 S1-S2 no murmur No wheeze rales rhonchi Abdomen soft obese nontender no rebound Trace lower extremity edema Power 5/5 grossly follows commands to some degree but not reliably  I have personally reviewed following labs and imaging studies  Labs:  Sodium 139 potassium 3.8 BUN/creatinine 9/0.9 calcium 8.5 LFTs normal BNP 200 WBC 6.6 hemoglobin 12.4 platelet 227 RSV positive BNP 200  Imaging studies:  CXR pending  Medical tests:  EKG independently reviewed: Sinus rhythm PR interval 0.12, QRS axis 30 degrees no ST-T wave changes no  criteria for LVH  Test discussed with performing physician: y   Decision to obtain old records:  y   Review and summation of old records:  y   Active Problems:   * No active hospital problems. *   Assessment/Plan Viral prodrome with cough diarrhea vomit Mildly hypoxic on arrival  Suspect combination of mild CHF versus RSV for this specific event  We will hold IV fluid started in the ED, I will start her home medication of Lasix at a dose of 60 (she takes 40 as needed at facility) and will monitor trends.  I do not really think she has severe symptoms and if she looks reasonably good tomorrow she can go back to her facility Acute RSV with possible respiratory failure superimposed on the same as per below  Symptomatic management only-no other treatments necessary at this time Dementia, risk of dysphagia  Daughter tells me that over the past 4 months she has been coughing more-she has been progressively placed on a chopped diet Chest x-ray shows may have an infiltrate on the left side and poor air entry although this  may just be atelectasis  Will repeat x-ray tomorrow for comparison Acute superimposed on chronic heart failure outpatient EF 60-65% 2021  Previously followed by Dr. Wyline Mood  Given progression of dementia would minimize meds-will discontinue Micardis, Zocor can continue Toprol-XL at lower dose of 50  Discontinue aspirin 325 Diabetes mellitus type 2  Last A1c 07/2022 6.8 Discontinue metformin XR 500 and would not cover   Severity of Illness: The appropriate patient status for this patient is INPATIENT. Inpatient status is judged to be reasonable and necessary in order to provide the required intensity of service to ensure the patient's safety. The patient's presenting symptoms, physical exam findings, and initial radiographic and laboratory data in the context of their chronic comorbidities is felt to place them at high risk for further clinical deterioration. Furthermore, it is not anticipated that the patient will be medically stable for discharge from the hospital within 2 midnights of admission.   * I certify that at the point of admission it is my clinical judgment that the patient will require inpatient hospital care spanning beyond 2 midnights from the point of admission due to high intensity of service, high risk for further deterioration and high frequency of surveillance required.*   Family Communication: Discussed with both daughters at bedside  DVT ppx: SCD Consults called & Whom: None  Time spent: 60 minutes  Mahala Menghini, MD Cordelia Poche my NP partners at night for Care related issues] [facility] --[employer] OR , [email or web address]; password [facility]  01/06/2024, 2:44 PM

## 2024-01-06 NOTE — ED Notes (Signed)
Unable to get urine sample with bedpan. Peri wick has been placed on pt.

## 2024-01-06 NOTE — ED Notes (Signed)
Bladder scan was completed; 5mL

## 2024-01-06 NOTE — ED Provider Notes (Signed)
[place] EMERGENCY DEPARTMENT AT [facility] Provider Note   CSN: [id number] Arrival date & time: 01/06/24  1141     History  Chief Complaint  Patient presents with   URI    Glenda Nolan is a 89 y.o. female history of colon cancer, dementia, diabetes, GERD presented with URI-like symptoms for the past week.  Patient is from [facility] and reportedly had nausea vomiting along with diarrhea for the past week along with a cough.  Daughter states that patient does tend to get swollen but denies any history of CHF.  Home Medications Prior to Admission medications   Medication Sig Start Date End Date Taking? Authorizing Provider  acetaminophen (TYLENOL) 325 MG tablet Take 325 mg by mouth daily after lunch.     [provider]  alendronate (FOSAMAX) 70 MG tablet Take 70 mg by mouth every Monday.  11/21/16   [provider]  aspirin EC 325 MG EC tablet Take 1 tablet (325 mg total) by mouth daily with breakfast. 03/21/17   Vickki Hearing, MD  cholecalciferol (VITAMIN D) 1000 UNITS tablet Take 1,000 Units by mouth daily after lunch.     [provider]  docusate sodium (COLACE) 100 MG capsule Take 1 capsule (100 mg total) by mouth 2 (two) times daily as needed for mild constipation. 01/27/23   Vassie Loll, MD  donepezil (ARICEPT) 10 MG tablet Take 10 mg by mouth daily. 01/15/23   [provider]  furosemide (LASIX) 20 MG tablet Take 2 tablets (40 mg total) by mouth daily as needed for fluid or edema. 01/27/23   Vassie Loll, MD  levothyroxine (SYNTHROID) 75 MCG tablet Take 75 mcg by mouth every morning. 02/11/21   [provider]  metFORMIN (GLUCOPHAGE-XR) 500 MG 24 hr tablet Take 1 tablet (500 mg total) by mouth daily with breakfast. 01/27/23   Vassie Loll, MD  metoprolol succinate (TOPROL-XL) 100 MG 24 hr tablet Take 1 tablet (100 mg total) by mouth daily. 01/27/23   Vassie Loll, MD  Multiple Vitamins-Minerals [facility]  VITAMIN PO) Take 1 tablet by mouth daily after lunch.     [provider]  omeprazole (PRILOSEC) 20 MG capsule Take 20 mg by mouth daily after breakfast. (0800)    [provider]  potassium chloride (MICRO-K) 10 MEQ CR capsule Take 10 mEq by mouth daily. 04/10/21   [provider]  raloxifene (EVISTA) 60 MG tablet Take 60 mg by mouth daily after breakfast. (0800)    [provider]  simvastatin (ZOCOR) 40 MG tablet Take 40 mg by mouth at bedtime.     [provider]  telmisartan (MICARDIS) 80 MG tablet Take 0.5 tablets (40 mg total) by mouth daily. 01/27/23   Vassie Loll, MD      Allergies    Patient has no known allergies.    Review of Systems   Review of Systems  Physical Exam Updated Vital Signs BP 137/69   Pulse 86   Temp 98.6 F (37 C) (Oral)   Resp 13   SpO2 96%  Physical Exam Constitutional:      General: She is not in acute distress. Cardiovascular:     Rate and Rhythm: Normal rate and regular rhythm.     Pulses: Normal pulses.     Heart sounds: Normal heart sounds.  Pulmonary:     Effort: Pulmonary effort is normal. No respiratory distress.     Breath sounds: Wheezing (Bilaterally) present.  Comments: Able to speak in full sentences Musculoskeletal:     Right lower leg: No edema.     Left lower leg: No edema.     Comments: No calf tenderness  Skin:    General: Skin is warm and dry.  Neurological:     Mental Status: She is alert.  Psychiatric:        Mood and Affect: Mood normal.     ED Results / Procedures / Treatments   Labs (all labs ordered are listed, but only abnormal results are displayed) Labs Reviewed  RESP PANEL BY RT-PCR (RSV, FLU A&B, COVID)  RVPGX2 - Abnormal; Notable for the following components:      Result Value   Resp Syncytial Virus by PCR POSITIVE (*)    All other components within normal limits  BASIC METABOLIC PANEL - Abnormal; Notable for the following components:   Glucose, Bld 155  (*)    Calcium 8.5 (*)    GFR, Estimated 57 (*)    All other components within normal limits  HEPATIC FUNCTION PANEL - Abnormal; Notable for the following components:   Albumin 3.1 (*)    Bilirubin, Direct 0.3 (*)    All other components within normal limits  BRAIN NATRIURETIC PEPTIDE - Abnormal; Notable for the following components:   B Natriuretic Peptide 200.0 (*)    All other components within normal limits  CBC WITH DIFFERENTIAL/PLATELET  LIPASE, BLOOD  URINALYSIS, ROUTINE W REFLEX MICROSCOPIC    EKG EKG Interpretation Date/Time:  Wednesday January 06 2024 13:08:26 EST Ventricular Rate:  75 PR Interval:  230 QRS Duration:  52 QT Interval:  390 QTC Calculation: 435 R Axis:   36  Text Interpretation: Sinus rhythm with 1st degree A-V block Low voltage QRS Nonspecific T wave abnormality Abnormal ECG When compared with ECG of 26-Jan-2023 13:29, PREVIOUS ECG IS PRESENT Confirmed by Estanislado Pandy [phone]) on 01/06/2024 2:27:13 PM  Radiology No results found.  Procedures Procedures    Medications Ordered in ED Medications  furosemide (LASIX) tablet 60 mg (has no administration in time range)  metoprolol succinate (TOPROL-XL) 24 hr tablet 50 mg (has no administration in time range)  pantoprazole (PROTONIX) EC tablet 40 mg (has no administration in time range)  donepezil (ARICEPT) tablet 10 mg (has no administration in time range)  levothyroxine (SYNTHROID) tablet 75 mcg (has no administration in time range)  heparin injection 5,000 Units (has no administration in time range)  hydrALAZINE (APRESOLINE) injection 5 mg (has no administration in time range)  ipratropium-albuterol (DUONEB) 0.5-2.5 (3) MG/3ML nebulizer solution 3 mL (3 mLs Nebulization Given 01/06/24 1341)  sodium chloride 0.9 % bolus 500 mL ( Intravenous Restarted 01/06/24 1522)    ED Course/ Medical Decision Making/ A&P                                 Medical Decision Making Amount and/or Complexity of Data  Reviewed Labs: ordered. Radiology: ordered.  Risk Prescription drug management. Decision regarding hospitalization.   Glenda Nolan 89 y.o. presented today for shortness of breath.  Working DDx that I considered at this time includes, but not limited to, asthma/COPD exacerbation, URI, viral illness, anemia, ACS, PE, pneumonia, pleural effusion, lung cancer, CHF, respiratory distress, medication side effect, intoxication.  R/o DDx: asthma/COPD exacerbation, anemia, ACS, PE, pleural effusion, lung cancer, CHF, respiratory distress, medication side effect, intoxication: These are considered less likely due to history of present illness,  physical exam, labs/imaging findings  Review of prior external notes: 08/03/2023 office visit  Unique Tests and My Independent Interpretation:  CBC: Unremarkable BMP: Unremarkable Hepatic function panel: Unremarkable BNP: Unremarkable EKG: Sinus 75 bpm, first-degree heart block noted, no ST elevations or depressions noted Lipase: Unremarkable CXR: Possible left lobe pneumonia Respiratory Panel: RSV positive  Social Determinants of Health: none  Discussion with Independent Historian:  Family members  Discussion of Management of Tests:  Samtani, MD Hospitalist  Risk: High: hospitalization or escalation of hospital-level care  Risk Stratification Score: None  Staffed with Young, DO  Plan: On exam patient was no acute distress with stable vitals. Physical exam showed wheezing bilaterally and so we will order a breathing treatment. The cardiac monitor was ordered secondary to the patient's history of shortness of breath and to monitor the patient for dysrhythmia. Cardiac monitor by my independent interpretation showed normal sinus.  Labs ordered including BNP due to daughter reporting patient being fluid overloaded however on exam patient does not appear fluid overloaded.  Patient did receive antibiotics from her nursing facility however patient and  family are unsure what these antibiotics are.  Patient did test positive for RSV which I do suspect is causing her symptoms.  Will ambulate her to see if she becomes hypoxic.  Patient is unable to be ambulated as she is bedridden at baseline according to her daughters.  Patient's labs do show RSV however the rest of her labs are ultimately reassuring.  Patient was slightly hypotensive at 102/89 so we will give some fluids.  Patient's SpO2 is 91-92% however when I went to go reevaluate her she was 87% and nursing was notified.  After shared decision-making with the daughters we feel patient would benefit from [place] overnight and so we will consult hospitalist.  I spoke to the hospitalist and he states he will come to evaluate the patient to see if patient does require admission.  At this time patient stable to be evaluated by the hospitalist.  Hospitalist evaluated the patient and agrees with admission.  Patient stable to be admitted.  This chart was dictated using voice recognition software.  Despite best efforts to proofread,  errors can occur which can change the documentation meaning.         Final Clinical Impression(s) / ED Diagnoses Final diagnoses:  RSV (respiratory syncytial virus infection)    Rx / DC Orders ED Discharge Orders     None         Remi Deter 01/06/24 1539    Coral Spikes, DO 01/07/24 1536

## 2024-01-06 NOTE — ED Notes (Signed)
Placed purewick on pt.

## 2024-01-07 ENCOUNTER — Inpatient Hospital Stay (HOSPITAL_COMMUNITY): Payer: Medicare Other

## 2024-01-07 DIAGNOSIS — R0902 Hypoxemia: Secondary | ICD-10-CM | POA: Diagnosis not present

## 2024-01-07 DIAGNOSIS — J210 Acute bronchiolitis due to respiratory syncytial virus: Secondary | ICD-10-CM | POA: Diagnosis not present

## 2024-01-07 DIAGNOSIS — Z8709 Personal history of other diseases of the respiratory system: Secondary | ICD-10-CM | POA: Diagnosis not present

## 2024-01-07 LAB — COMPREHENSIVE METABOLIC PANEL
ALT: 17 U/L (ref 0–44)
AST: 23 U/L (ref 15–41)
Albumin: 3.1 g/dL — ABNORMAL LOW (ref 3.5–5.0)
Alkaline Phosphatase: 57 U/L (ref 38–126)
Anion gap: 13 (ref 5–15)
BUN: 8 mg/dL (ref 8–23)
CO2: 27 mmol/L (ref 22–32)
Calcium: 8.2 mg/dL — ABNORMAL LOW (ref 8.9–10.3)
Chloride: 97 mmol/L — ABNORMAL LOW (ref 98–111)
Creatinine, Ser: 0.74 mg/dL (ref 0.44–1.00)
GFR, Estimated: >60 mL/min (ref >60)
Glucose, Bld: 138 mg/dL — ABNORMAL HIGH (ref 70–99)
Potassium: 3 mmol/L — ABNORMAL LOW (ref 3.5–5.1)
Sodium: 137 mmol/L (ref 135–145)
Total Bilirubin: 0.5 mg/dL (ref 0.0–1.2)
Total Protein: 6.6 g/dL (ref 6.5–8.1)

## 2024-01-07 LAB — CBC
HCT: 36.3 % (ref 36.0–46.0)
Hemoglobin: 12 g/dL (ref 12.0–15.0)
MCH: 29.5 pg (ref 26.0–34.0)
MCHC: 33.1 g/dL (ref 30.0–36.0)
MCV: 89.2 fL (ref 80.0–100.0)
Platelets: 206 10*3/uL (ref 150–400)
RBC: 4.07 MIL/uL (ref 3.87–5.11)
RDW: 13.2 % (ref 11.5–15.5)
WBC: 5.7 10*3/uL (ref 4.0–10.5)
nRBC: 0 % (ref 0.0–0.2)

## 2024-01-07 MED ORDER — DONEPEZIL HCL 10 MG PO TABS: 10.0000 mg | ORAL_TABLET | Freq: Every day | ORAL | 0 refills | Status: AC

## 2024-01-07 MED ORDER — POTASSIUM CHLORIDE CRYS ER 20 MEQ PO TBCR
40.0000 meq | EXTENDED_RELEASE_TABLET | Freq: Every day | ORAL | Status: DC
  Administered 2024-01-07: 40 meq via ORAL
  Filled 2024-01-07: qty 2

## 2024-01-07 NOTE — Progress Notes (Signed)
Called report to nurse at [facility].

## 2024-01-07 NOTE — Care Management Obs Status (Signed)
MEDICARE OBSERVATION STATUS NOTIFICATION   Patient Details  Name: Glenda Nolan MRN: [id number] Date of Birth: [birth year]   Medicare Observation Status Notification Given:  Yes    Elliot Gault, LCSW 01/07/2024, 10:33 AM

## 2024-01-07 NOTE — Care Management CC44 (Signed)
Condition Code 44 Documentation Completed  Patient Details  Name: Glenda Nolan MRN: [id number] Date of Birth: [birth year]   Condition Code 44 given:  Yes Patient signature on Condition Code 44 notice:  Yes Documentation of 2 MD's agreement:  Yes Code 44 added to claim:  Yes    Elliot Gault, LCSW 01/07/2024, 10:33 AM

## 2024-01-07 NOTE — Plan of Care (Signed)
Problem: Activity: Goal: Risk for activity intolerance will decrease Outcome: Progressing   Problem: Coping: Goal: Level of anxiety will decrease Outcome: Progressing

## 2024-01-07 NOTE — TOC Initial Note (Signed)
Transition of Care [facility]) - Initial/Assessment Note    Patient Details  Name: Glenda Nolan MRN: [id number] Date of Birth: 1935/07/18  Transition of [facility]) CM/SW Contact:    Elliot Gault, LCSW Phone Number: 01/07/2024, 11:11 AM  Clinical Narrative:                  Pt here from [facility] long term care. MD states pt stable for dc back there today.  Pt with dementia diagnosis. Daughters present in room at bedside. Spoke with them about dc plan. Dtr, Glenda Nolan, states plan is for return to [facility] long term care.  Updated Glenda Nolan at [facility]. Pt can admit today. DC clinical sent electronically. RN to call report. EMS arranged.  No other TOC needs for dc.   Expected Discharge Plan: Long Term Nursing Home Barriers to Discharge: Barriers Resolved   Patient Goals and CMS Choice Patient states their goals for this hospitalization and ongoing recovery are:: return to SNF   Choice offered to / list presented to : Adult Children      Expected Discharge Plan and Services In-house Referral: Clinical Social Work   Post Acute Care Choice: Resumption of Svcs/PTA Provider Living arrangements for the past 2 months: Skilled Nursing Facility Expected Discharge Date: 01/07/24                                    Prior Living Arrangements/Services Living arrangements for the past 2 months: Skilled Nursing Facility Lives with:: Facility Resident Patient language and need for interpreter reviewed:: Yes Do you feel safe going back to the place where you live?: Yes      Need for Family Participation in Patient Care: No (Comment) Care giver support system in place?: Yes (comment)   Criminal Activity/Legal Involvement Pertinent to Current Situation/Hospitalization: No - Comment as needed  Activities of Daily Living      Permission Sought/Granted                  Emotional Assessment Appearance:: Appears stated age     Orientation: : Oriented to  Self Alcohol / Substance Use: Not Applicable Psych Involvement: No (comment)  Admission diagnosis:  RSV (acute bronchiolitis due to respiratory syncytial virus) [J21.0] RSV (respiratory syncytial virus infection) [B33.8] Patient Active Problem List   Diagnosis Date Noted   RSV (acute bronchiolitis due to respiratory syncytial virus) 01/06/2024   General weakness 01/27/2023   History of urinary tract infection 01/27/2023   Dehydration 01/27/2023   Acute metabolic encephalopathy 01/26/2023   Hyponatremia 01/26/2023   Pressure injury of skin 08/05/2022   Diarrhea 07/30/2022   Choledocholithiasis 07/28/2022   Dementia (HCC) 07/28/2022   AKI (acute kidney injury) (HCC) 07/28/2022   Diabetes (HCC) 07/28/2022   Hypokalemia 07/28/2022   Spinal stenosis of lumbar region with neurogenic claudication 05/24/2019   Spondylosis without myelopathy or radiculopathy, lumbar region 05/24/2019   S/P TKR (total knee replacement), left 03/18/2017 03/18/2017   Heme positive stool 03/22/2012   Melena 03/22/2012   GERD (gastroesophageal reflux disease) 03/22/2012   Malignant neoplasm of colon (HCC) 04/26/2009   Migraine headache 04/26/2009   Essential hypertension 04/26/2009   Osteoarthritis 04/26/2009   IRON DEFICIENCY ANEMIA, HX OF 04/26/2009   KNEE, ARTHRITIS, DEGEN./OSTEO 03/01/2009   KNEE PAIN 03/01/2009   PCP:  Benita Stabile, MD Pharmacy:   [facility] - [place], [place] - [address] [place]  [place] [address] Phone: [phone] Fax: [phone]  [facility] - [place], [place] - [address] [place] [address] Phone: [phone] Fax: [phone]     Social Drivers of Health (SDOH) Social History: SDOH Screenings   Food Insecurity: Patient Unable To Answer (01/07/2024)  Housing: Patient Unable To Answer (01/07/2024)  Transportation Needs: Patient Unable To Answer (01/07/2024)  Utilities: Patient Unable To Answer (01/07/2024)   Social Connections: Unknown (01/07/2024)  Tobacco Use: Medium Risk (01/26/2023)   SDOH Interventions:     Readmission Risk Interventions     No data to display

## 2024-01-07 NOTE — Plan of Care (Signed)
Problem: Education: Goal: Knowledge of General Education information will improve Description: Including pain rating scale, medication(s)/side effects and non-pharmacologic comfort measures Outcome: Completed/Met   Problem: Health Behavior/Discharge Planning: Goal: Ability to manage health-related needs will improve Outcome: Completed/Met   Problem: Clinical Measurements: Goal: Ability to maintain clinical measurements within normal limits will improve Outcome: Completed/Met Goal: Will remain free from infection Outcome: Completed/Met Goal: Diagnostic test results will improve Outcome: Completed/Met Goal: Respiratory complications will improve Outcome: Completed/Met Goal: Cardiovascular complication will be avoided Outcome: Completed/Met   Problem: Activity: Goal: Risk for activity intolerance will decrease Outcome: Completed/Met   Problem: Nutrition: Goal: Adequate nutrition will be maintained Outcome: Completed/Met   Problem: Coping: Goal: Level of anxiety will decrease Outcome: Completed/Met   Problem: Elimination: Goal: Will not experience complications related to bowel motility Outcome: Completed/Met Goal: Will not experience complications related to urinary retention Outcome: Completed/Met   Problem: Pain Managment: Goal: General experience of comfort will improve and/or be controlled Outcome: Completed/Met   Problem: Safety: Goal: Ability to remain free from injury will improve Outcome: Completed/Met   Problem: Skin Integrity: Goal: Risk for impaired skin integrity will decrease Outcome: Completed/Met   Problem: Activity: Goal: Ability to tolerate increased activity will improve Outcome: Completed/Met   Problem: Clinical Measurements: Goal: Ability to maintain a body temperature in the normal range will improve Outcome: Completed/Met   Problem: Respiratory: Goal: Ability to maintain adequate ventilation will improve Outcome: Completed/Met Goal:  Ability to maintain a clear airway will improve Outcome: Completed/Met

## 2024-01-07 NOTE — Discharge Summary (Signed)
Physician Discharge Summary  Glenda Nolan NFA:[id number] DOB: 04-06-1935 DOA: 01/06/2024  PCP: Benita Stabile, MD  Admit date: 01/06/2024 Discharge date: 01/07/2024  Time spent: 33 minutes  Recommendations for Outpatient Follow-up:  Recommend de-escalation of meds and please have a discussion about goals of care with family  Discharge Diagnoses:  MAIN problem for hospitalization   RSV infection with mild diarrhea no hypoxia at discharge  Please see below for itemized issues addressed in HOpsital- refer to other progress notes for clarity if needed  Discharge Condition: Improved but overall is guarded  Diet recommendation: Dysphagia 3 diet  Filed Weights   01/06/24 2113  Weight: 74.9 kg    History of present illness:  89 year old black female [facility] resident for the past year--she is nonambulatory at baseline HTN DM TY 2  She has underlying dementia at least moderate and has occasional difficulty recognizing her daughter and daughter states that dementia goes back to about 5 years prior to admission   She was recently treated at [facility] with some type of antibiotic and has had poor appetite over the past several weeks and has become increasingly swollen over the past month When she came to emergency room she was transiently hypoxic into the 8788 range but I believe this was a pleth error   I am unable to obtain ROS from the patient personally  Hospital Course:  Symptomatic RSV with cough diarrhea vomit Mildly hypoxic on arrival             Suspect combination of mild CHF versus RSV for this specific event             Transiently given IV fluid in ED, given 1 dose of Lasix 60 and was sent home on scheduled Lasix 40 daily at discharge She was not Requiring oxygen subsequently, was sitting up in bed eating and looked quite comfortable and I feel she can continue her management in the outpatient setting Acute RSV with possible respiratory failure superimposed on the  same as per below             Symptomatic management only-no other treatments necessary at this time Dementia, risk of dysphagia             Daughter tells me that over the past 4 months she has been coughing more-she has been progressively placed on a chopped diet Chest x-ray shows may have an infiltrate on the left side and poor air entry although this may just be atelectasis             Repeat x-ray 2/13 showed no infiltrate and I feel that she had some atelectasis Acute superimposed on chronic heart failure outpatient EF 60-65% 2021             Previously followed by Dr. Wyline Mood             Given progression of dementia would minimize meds-will discontinue Micardis, Zocor can continue Toprol-XL at lower dose of 50             Discontinue aspirin 325 if felt reasonable by PCP-I have not discontinued it as I am not sure if this was for stroke in the past Diabetes mellitus type 2             Last A1c 07/2022 6.8 Discontinue metformin XR 500 and would not cover as a risk of hypoglycemia outweighs the benefit  Discharge Exam: Vitals:   01/06/24 2340 01/07/24 0407  BP: (!) 105/57 Marland Kitchen)  146/75  Pulse: 92 91  Resp: 20 20  Temp: 98.6 F (37 C) 98 F (36.7 C)  SpO2: 93% 95%    Subj on day of d/c   Sitting up looking comfortable eating chopped up diet no distress  General Exam on discharge  EOMI NCAT no focal deficit no icterus no pallor No rales rhonchi wheezes Not on oxygen Abdomen soft slightly distended no rebound no guarding ROM is intact with focal deficit Neuro is intact power is 5/5  Discharge Instructions   Discharge Instructions     Diet - low sodium heart healthy   Complete by: As directed    Increase activity slowly   Complete by: As directed       Allergies as of 01/07/2024   No Known Allergies      Medication List     STOP taking these medications    docusate sodium 100 MG capsule Commonly known as: COLACE   esomeprazole 20 MG capsule Commonly known  as: NEXIUM   metFORMIN 500 MG 24 hr tablet Commonly known as: GLUCOPHAGE-XR   potassium chloride 10 MEQ CR capsule Commonly known as: MICRO-K   simvastatin 40 MG tablet Commonly known as: ZOCOR   telmisartan 40 MG tablet Commonly known as: MICARDIS       TAKE these medications    acetaminophen 325 MG tablet Commonly known as: TYLENOL Take 325 mg by mouth daily after lunch.   alendronate 70 MG tablet Commonly known as: FOSAMAX Take 70 mg by mouth every Monday.   aspirin EC 325 MG tablet Take 1 tablet (325 mg total) by mouth daily with breakfast.   cholecalciferol 25 MCG (1000 UNIT) tablet Commonly known as: VITAMIN D3 Take 1,000 Units by mouth daily after lunch.   donepezil 10 MG tablet Commonly known as: ARICEPT Take 10 mg by mouth daily.   furosemide 40 MG tablet Commonly known as: LASIX Take 40 mg by mouth daily.   levothyroxine 75 MCG tablet Commonly known as: SYNTHROID Take 75 mcg by mouth every morning.   metoprolol succinate 100 MG 24 hr tablet Commonly known as: TOPROL-XL Take 1 tablet (100 mg total) by mouth daily.   omeprazole 20 MG capsule Commonly known as: PRILOSEC Take 20 mg by mouth daily after breakfast. (0800)   PX SENIOR VITAMIN PO Take 1 tablet by mouth daily after lunch.   raloxifene 60 MG tablet Commonly known as: EVISTA Take 60 mg by mouth daily after breakfast. (0800)       No Known Allergies    The results of significant diagnostics from this hospitalization (including imaging, microbiology, ancillary and laboratory) are listed below for reference.    Significant Diagnostic Studies: DG CHEST PORT 1 VIEW Result Date: 01/07/2024 CLINICAL DATA:  89 year old female history of pneumonia. EXAM: PORTABLE CHEST 1 VIEW COMPARISON:  Chest x-ray 01/06/2024. FINDINGS: Lung volumes are normal. No consolidative airspace disease. No pleural effusions. No pneumothorax. No pulmonary nodule or mass noted. Pulmonary vasculature and the  cardiomediastinal silhouette are within normal limits. IMPRESSION: No radiographic evidence of acute cardiopulmonary disease. Electronically Signed   By: Trudie Reed M.D.   On: 01/07/2024 06:20   DG Chest 2 View Result Date: 01/06/2024 CLINICAL DATA:  Shortness of breath. EXAM: CHEST - 2 VIEW COMPARISON:  01/26/2023. FINDINGS: Low lung volume. Bilateral lung fields are clear. Apparent blunting of left lateral costophrenic angle corresponds to prominent epicardial fat pad. Right lateral costophrenic angle is clear. Bilateral posterior costophrenic angles are also clear. Stable cardio-mediastinal  silhouette. No acute osseous abnormalities. Note is made of bilateral high riding humeri in relation to glenoid, concerning for complete rotator cuff tear. The soft tissues are within normal limits. IMPRESSION: No active cardiopulmonary disease. Electronically Signed   By: Jules Schick M.D.   On: 01/06/2024 16:01    Microbiology: Recent Results (from the past 240 hours)  Resp panel by RT-PCR (RSV, Flu A&B, Covid) Anterior Nasal Swab     Status: Abnormal   Collection Time: 01/06/24 11:52 AM   Specimen: Anterior Nasal Swab  Result Value Ref Range Status   SARS Coronavirus 2 by RT PCR NEGATIVE NEGATIVE Final    Comment: (NOTE) SARS-CoV-2 target nucleic acids are NOT DETECTED.  The SARS-CoV-2 RNA is generally detectable in upper respiratory specimens during the acute phase of infection. The lowest concentration of SARS-CoV-2 viral copies this assay can detect is 138 copies/mL. A negative result does not preclude SARS-Cov-2 infection and should not be used as the sole basis for treatment or other patient management decisions. A negative result may occur with  improper specimen collection/handling, submission of specimen other than nasopharyngeal swab, presence of viral mutation(s) within the areas targeted by this assay, and inadequate number of viral copies(<138 copies/mL). A negative result must  be combined with clinical observations, patient history, and epidemiological information. The expected result is Negative.  Fact Sheet for Patients:  BloggerCourse.com  Fact Sheet for Healthcare Providers:  SeriousBroker.it  This test is no t yet approved or cleared by the [place] FDA and  has been authorized for detection and/or diagnosis of SARS-CoV-2 by FDA under an Emergency Use Authorization (EUA). This EUA will remain  in effect (meaning this test can be used) for the duration of the COVID-19 declaration under Section 564(b)(1) of the Act, 21 U.S.C.section 360bbb-3(b)(1), unless the authorization is terminated  or revoked sooner.       Influenza A by PCR NEGATIVE NEGATIVE Final   Influenza B by PCR NEGATIVE NEGATIVE Final    Comment: (NOTE) The Xpert Xpress SARS-CoV-2/FLU/RSV plus assay is intended as an aid in the diagnosis of influenza from Nasopharyngeal swab specimens and should not be used as a sole basis for treatment. Nasal washings and aspirates are unacceptable for Xpert Xpress SARS-CoV-2/FLU/RSV testing.  Fact Sheet for Patients: BloggerCourse.com  Fact Sheet for Healthcare Providers: SeriousBroker.it  This test is not yet approved or cleared by the [place] FDA and has been authorized for detection and/or diagnosis of SARS-CoV-2 by FDA under an Emergency Use Authorization (EUA). This EUA will remain in effect (meaning this test can be used) for the duration of the COVID-19 declaration under Section 564(b)(1) of the Act, 21 U.S.C. section 360bbb-3(b)(1), unless the authorization is terminated or revoked.     Resp Syncytial Virus by PCR POSITIVE (A) NEGATIVE Final    Comment: (NOTE) Fact Sheet for Patients: BloggerCourse.com  Fact Sheet for Healthcare Providers: SeriousBroker.it  This test is  not yet approved or cleared by the [place] FDA and has been authorized for detection and/or diagnosis of SARS-CoV-2 by FDA under an Emergency Use Authorization (EUA). This EUA will remain in effect (meaning this test can be used) for the duration of the COVID-19 declaration under Section 564(b)(1) of the Act, 21 U.S.C. section 360bbb-3(b)(1), unless the authorization is terminated or revoked.  Performed at [facility], [address], [place], [place] [address]      Labs: Basic Metabolic Panel: Recent Labs  Lab 01/06/24 1238 01/07/24 0325  NA 139 137  K 3.8 3.0*  CL 100 97*  CO2 26 27  GLUCOSE 155* 138*  BUN 9 8  CREATININE 0.96 0.74  CALCIUM 8.5* 8.2*   Liver Function Tests: Recent Labs  Lab 01/06/24 1238 01/07/24 0325  AST 26 23  ALT 17 17  ALKPHOS 56 57  BILITOT 0.8 0.5  PROT 7.0 6.6  ALBUMIN 3.1* 3.1*   Recent Labs  Lab 01/06/24 1238  LIPASE 25   No results for input(s): "AMMONIA" in the last 168 hours. CBC: Recent Labs  Lab 01/06/24 1238 01/07/24 0325  WBC 6.6 5.7  NEUTROABS 4.7  --   HGB 12.4 12.0  HCT 37.7 36.3  MCV 90.2 89.2  PLT 227 206   Cardiac Enzymes: No results for input(s): "CKTOTAL", "CKMB", "CKMBINDEX", "TROPONINI" in the last 168 hours. BNP: BNP (last 3 results) Recent Labs    01/06/24 1238  BNP 200.0*    ProBNP (last 3 results) No results for input(s): "PROBNP" in the last 8760 hours.  CBG: No results for input(s): "GLUCAP" in the last 168 hours.  Signed:  Rhetta Mura MD   [facility] 01/07/2024, 8:54 AM

## 2024-01-08 DIAGNOSIS — M6281 Muscle weakness (generalized): Secondary | ICD-10-CM | POA: Diagnosis not present

## 2024-01-08 DIAGNOSIS — R293 Abnormal posture: Secondary | ICD-10-CM | POA: Diagnosis not present

## 2024-01-08 DIAGNOSIS — R279 Unspecified lack of coordination: Secondary | ICD-10-CM | POA: Diagnosis not present

## 2024-01-10 DIAGNOSIS — M6281 Muscle weakness (generalized): Secondary | ICD-10-CM | POA: Diagnosis not present

## 2024-01-10 DIAGNOSIS — R293 Abnormal posture: Secondary | ICD-10-CM | POA: Diagnosis not present

## 2024-01-10 DIAGNOSIS — R279 Unspecified lack of coordination: Secondary | ICD-10-CM | POA: Diagnosis not present

## 2024-01-11 DIAGNOSIS — I509 Heart failure, unspecified: Secondary | ICD-10-CM | POA: Diagnosis not present

## 2024-01-11 DIAGNOSIS — R051 Acute cough: Secondary | ICD-10-CM | POA: Diagnosis not present

## 2024-01-11 DIAGNOSIS — M6281 Muscle weakness (generalized): Secondary | ICD-10-CM | POA: Diagnosis not present

## 2024-01-11 DIAGNOSIS — E1169 Type 2 diabetes mellitus with other specified complication: Secondary | ICD-10-CM | POA: Diagnosis not present

## 2024-01-11 DIAGNOSIS — R279 Unspecified lack of coordination: Secondary | ICD-10-CM | POA: Diagnosis not present

## 2024-01-11 DIAGNOSIS — R293 Abnormal posture: Secondary | ICD-10-CM | POA: Diagnosis not present

## 2024-01-12 DIAGNOSIS — M6281 Muscle weakness (generalized): Secondary | ICD-10-CM | POA: Diagnosis not present

## 2024-01-12 DIAGNOSIS — R279 Unspecified lack of coordination: Secondary | ICD-10-CM | POA: Diagnosis not present

## 2024-01-12 DIAGNOSIS — R293 Abnormal posture: Secondary | ICD-10-CM | POA: Diagnosis not present

## 2024-01-13 DIAGNOSIS — R293 Abnormal posture: Secondary | ICD-10-CM | POA: Diagnosis not present

## 2024-01-13 DIAGNOSIS — R051 Acute cough: Secondary | ICD-10-CM | POA: Diagnosis not present

## 2024-01-13 DIAGNOSIS — I509 Heart failure, unspecified: Secondary | ICD-10-CM | POA: Diagnosis not present

## 2024-01-13 DIAGNOSIS — E1169 Type 2 diabetes mellitus with other specified complication: Secondary | ICD-10-CM | POA: Diagnosis not present

## 2024-01-13 DIAGNOSIS — R279 Unspecified lack of coordination: Secondary | ICD-10-CM | POA: Diagnosis not present

## 2024-01-13 DIAGNOSIS — M6281 Muscle weakness (generalized): Secondary | ICD-10-CM | POA: Diagnosis not present

## 2024-01-14 DIAGNOSIS — R279 Unspecified lack of coordination: Secondary | ICD-10-CM | POA: Diagnosis not present

## 2024-01-14 DIAGNOSIS — R293 Abnormal posture: Secondary | ICD-10-CM | POA: Diagnosis not present

## 2024-01-14 DIAGNOSIS — M6281 Muscle weakness (generalized): Secondary | ICD-10-CM | POA: Diagnosis not present

## 2024-01-15 DIAGNOSIS — E1169 Type 2 diabetes mellitus with other specified complication: Secondary | ICD-10-CM | POA: Diagnosis not present

## 2024-01-15 DIAGNOSIS — R293 Abnormal posture: Secondary | ICD-10-CM | POA: Diagnosis not present

## 2024-01-15 DIAGNOSIS — R279 Unspecified lack of coordination: Secondary | ICD-10-CM | POA: Diagnosis not present

## 2024-01-15 DIAGNOSIS — R051 Acute cough: Secondary | ICD-10-CM | POA: Diagnosis not present

## 2024-01-15 DIAGNOSIS — I509 Heart failure, unspecified: Secondary | ICD-10-CM | POA: Diagnosis not present

## 2024-01-15 DIAGNOSIS — M6281 Muscle weakness (generalized): Secondary | ICD-10-CM | POA: Diagnosis not present

## 2024-01-18 DIAGNOSIS — R293 Abnormal posture: Secondary | ICD-10-CM | POA: Diagnosis not present

## 2024-01-18 DIAGNOSIS — R279 Unspecified lack of coordination: Secondary | ICD-10-CM | POA: Diagnosis not present

## 2024-01-18 DIAGNOSIS — M6281 Muscle weakness (generalized): Secondary | ICD-10-CM | POA: Diagnosis not present

## 2024-01-19 DIAGNOSIS — R279 Unspecified lack of coordination: Secondary | ICD-10-CM | POA: Diagnosis not present

## 2024-01-19 DIAGNOSIS — R293 Abnormal posture: Secondary | ICD-10-CM | POA: Diagnosis not present

## 2024-01-19 DIAGNOSIS — M6281 Muscle weakness (generalized): Secondary | ICD-10-CM | POA: Diagnosis not present

## 2024-01-20 DIAGNOSIS — R293 Abnormal posture: Secondary | ICD-10-CM | POA: Diagnosis not present

## 2024-01-20 DIAGNOSIS — R279 Unspecified lack of coordination: Secondary | ICD-10-CM | POA: Diagnosis not present

## 2024-01-20 DIAGNOSIS — M6281 Muscle weakness (generalized): Secondary | ICD-10-CM | POA: Diagnosis not present

## 2024-01-21 DIAGNOSIS — M6281 Muscle weakness (generalized): Secondary | ICD-10-CM | POA: Diagnosis not present

## 2024-01-21 DIAGNOSIS — R293 Abnormal posture: Secondary | ICD-10-CM | POA: Diagnosis not present

## 2024-01-21 DIAGNOSIS — R279 Unspecified lack of coordination: Secondary | ICD-10-CM | POA: Diagnosis not present

## 2024-01-24 DIAGNOSIS — R279 Unspecified lack of coordination: Secondary | ICD-10-CM | POA: Diagnosis not present

## 2024-01-24 DIAGNOSIS — M6281 Muscle weakness (generalized): Secondary | ICD-10-CM | POA: Diagnosis not present

## 2024-01-24 DIAGNOSIS — R293 Abnormal posture: Secondary | ICD-10-CM | POA: Diagnosis not present

## 2024-01-25 DIAGNOSIS — R279 Unspecified lack of coordination: Secondary | ICD-10-CM | POA: Diagnosis not present

## 2024-01-25 DIAGNOSIS — R293 Abnormal posture: Secondary | ICD-10-CM | POA: Diagnosis not present

## 2024-01-25 DIAGNOSIS — M6281 Muscle weakness (generalized): Secondary | ICD-10-CM | POA: Diagnosis not present

## 2024-01-26 DIAGNOSIS — R293 Abnormal posture: Secondary | ICD-10-CM | POA: Diagnosis not present

## 2024-01-26 DIAGNOSIS — M6281 Muscle weakness (generalized): Secondary | ICD-10-CM | POA: Diagnosis not present

## 2024-01-26 DIAGNOSIS — R279 Unspecified lack of coordination: Secondary | ICD-10-CM | POA: Diagnosis not present

## 2024-01-27 DIAGNOSIS — M6281 Muscle weakness (generalized): Secondary | ICD-10-CM | POA: Diagnosis not present

## 2024-01-27 DIAGNOSIS — R293 Abnormal posture: Secondary | ICD-10-CM | POA: Diagnosis not present

## 2024-01-27 DIAGNOSIS — R279 Unspecified lack of coordination: Secondary | ICD-10-CM | POA: Diagnosis not present

## 2024-01-28 DIAGNOSIS — M6281 Muscle weakness (generalized): Secondary | ICD-10-CM | POA: Diagnosis not present

## 2024-01-28 DIAGNOSIS — R293 Abnormal posture: Secondary | ICD-10-CM | POA: Diagnosis not present

## 2024-01-28 DIAGNOSIS — R279 Unspecified lack of coordination: Secondary | ICD-10-CM | POA: Diagnosis not present

## 2024-01-29 DIAGNOSIS — M6281 Muscle weakness (generalized): Secondary | ICD-10-CM | POA: Diagnosis not present

## 2024-01-29 DIAGNOSIS — R293 Abnormal posture: Secondary | ICD-10-CM | POA: Diagnosis not present

## 2024-01-29 DIAGNOSIS — R279 Unspecified lack of coordination: Secondary | ICD-10-CM | POA: Diagnosis not present

## 2024-02-01 DIAGNOSIS — R279 Unspecified lack of coordination: Secondary | ICD-10-CM | POA: Diagnosis not present

## 2024-02-01 DIAGNOSIS — M6281 Muscle weakness (generalized): Secondary | ICD-10-CM | POA: Diagnosis not present

## 2024-02-01 DIAGNOSIS — E1169 Type 2 diabetes mellitus with other specified complication: Secondary | ICD-10-CM | POA: Diagnosis not present

## 2024-02-01 DIAGNOSIS — R293 Abnormal posture: Secondary | ICD-10-CM | POA: Diagnosis not present

## 2024-02-01 DIAGNOSIS — I10 Essential (primary) hypertension: Secondary | ICD-10-CM | POA: Diagnosis not present

## 2024-02-01 DIAGNOSIS — E782 Mixed hyperlipidemia: Secondary | ICD-10-CM | POA: Diagnosis not present

## 2024-02-02 DIAGNOSIS — M6281 Muscle weakness (generalized): Secondary | ICD-10-CM | POA: Diagnosis not present

## 2024-02-02 DIAGNOSIS — R279 Unspecified lack of coordination: Secondary | ICD-10-CM | POA: Diagnosis not present

## 2024-02-02 DIAGNOSIS — R293 Abnormal posture: Secondary | ICD-10-CM | POA: Diagnosis not present

## 2024-02-03 DIAGNOSIS — R279 Unspecified lack of coordination: Secondary | ICD-10-CM | POA: Diagnosis not present

## 2024-02-03 DIAGNOSIS — M6281 Muscle weakness (generalized): Secondary | ICD-10-CM | POA: Diagnosis not present

## 2024-02-03 DIAGNOSIS — R293 Abnormal posture: Secondary | ICD-10-CM | POA: Diagnosis not present

## 2024-02-04 DIAGNOSIS — R293 Abnormal posture: Secondary | ICD-10-CM | POA: Diagnosis not present

## 2024-02-04 DIAGNOSIS — R279 Unspecified lack of coordination: Secondary | ICD-10-CM | POA: Diagnosis not present

## 2024-02-04 DIAGNOSIS — M6281 Muscle weakness (generalized): Secondary | ICD-10-CM | POA: Diagnosis not present

## 2024-02-05 DIAGNOSIS — M6281 Muscle weakness (generalized): Secondary | ICD-10-CM | POA: Diagnosis not present

## 2024-02-05 DIAGNOSIS — R279 Unspecified lack of coordination: Secondary | ICD-10-CM | POA: Diagnosis not present

## 2024-02-05 DIAGNOSIS — R293 Abnormal posture: Secondary | ICD-10-CM | POA: Diagnosis not present

## 2024-02-08 DIAGNOSIS — R279 Unspecified lack of coordination: Secondary | ICD-10-CM | POA: Diagnosis not present

## 2024-02-08 DIAGNOSIS — M6281 Muscle weakness (generalized): Secondary | ICD-10-CM | POA: Diagnosis not present

## 2024-02-08 DIAGNOSIS — R293 Abnormal posture: Secondary | ICD-10-CM | POA: Diagnosis not present

## 2024-02-09 DIAGNOSIS — R279 Unspecified lack of coordination: Secondary | ICD-10-CM | POA: Diagnosis not present

## 2024-02-09 DIAGNOSIS — R293 Abnormal posture: Secondary | ICD-10-CM | POA: Diagnosis not present

## 2024-02-09 DIAGNOSIS — M6281 Muscle weakness (generalized): Secondary | ICD-10-CM | POA: Diagnosis not present

## 2024-02-10 DIAGNOSIS — M6281 Muscle weakness (generalized): Secondary | ICD-10-CM | POA: Diagnosis not present

## 2024-02-10 DIAGNOSIS — R293 Abnormal posture: Secondary | ICD-10-CM | POA: Diagnosis not present

## 2024-02-10 DIAGNOSIS — R279 Unspecified lack of coordination: Secondary | ICD-10-CM | POA: Diagnosis not present

## 2024-02-11 DIAGNOSIS — M6281 Muscle weakness (generalized): Secondary | ICD-10-CM | POA: Diagnosis not present

## 2024-02-11 DIAGNOSIS — R279 Unspecified lack of coordination: Secondary | ICD-10-CM | POA: Diagnosis not present

## 2024-02-11 DIAGNOSIS — R293 Abnormal posture: Secondary | ICD-10-CM | POA: Diagnosis not present

## 2024-02-12 DIAGNOSIS — R279 Unspecified lack of coordination: Secondary | ICD-10-CM | POA: Diagnosis not present

## 2024-02-12 DIAGNOSIS — M6281 Muscle weakness (generalized): Secondary | ICD-10-CM | POA: Diagnosis not present

## 2024-02-12 DIAGNOSIS — R293 Abnormal posture: Secondary | ICD-10-CM | POA: Diagnosis not present

## 2024-02-15 DIAGNOSIS — M6281 Muscle weakness (generalized): Secondary | ICD-10-CM | POA: Diagnosis not present

## 2024-02-15 DIAGNOSIS — R293 Abnormal posture: Secondary | ICD-10-CM | POA: Diagnosis not present

## 2024-02-15 DIAGNOSIS — R279 Unspecified lack of coordination: Secondary | ICD-10-CM | POA: Diagnosis not present

## 2024-02-16 DIAGNOSIS — M6281 Muscle weakness (generalized): Secondary | ICD-10-CM | POA: Diagnosis not present

## 2024-02-16 DIAGNOSIS — R279 Unspecified lack of coordination: Secondary | ICD-10-CM | POA: Diagnosis not present

## 2024-02-16 DIAGNOSIS — R293 Abnormal posture: Secondary | ICD-10-CM | POA: Diagnosis not present

## 2024-02-16 DIAGNOSIS — E1151 Type 2 diabetes mellitus with diabetic peripheral angiopathy without gangrene: Secondary | ICD-10-CM | POA: Diagnosis not present

## 2024-02-16 DIAGNOSIS — L84 Corns and callosities: Secondary | ICD-10-CM | POA: Diagnosis not present

## 2024-02-17 DIAGNOSIS — R279 Unspecified lack of coordination: Secondary | ICD-10-CM | POA: Diagnosis not present

## 2024-02-17 DIAGNOSIS — R293 Abnormal posture: Secondary | ICD-10-CM | POA: Diagnosis not present

## 2024-02-17 DIAGNOSIS — M6281 Muscle weakness (generalized): Secondary | ICD-10-CM | POA: Diagnosis not present

## 2024-02-18 DIAGNOSIS — R293 Abnormal posture: Secondary | ICD-10-CM | POA: Diagnosis not present

## 2024-02-18 DIAGNOSIS — R279 Unspecified lack of coordination: Secondary | ICD-10-CM | POA: Diagnosis not present

## 2024-02-18 DIAGNOSIS — M6281 Muscle weakness (generalized): Secondary | ICD-10-CM | POA: Diagnosis not present

## 2024-02-19 DIAGNOSIS — M6281 Muscle weakness (generalized): Secondary | ICD-10-CM | POA: Diagnosis not present

## 2024-02-19 DIAGNOSIS — R279 Unspecified lack of coordination: Secondary | ICD-10-CM | POA: Diagnosis not present

## 2024-02-19 DIAGNOSIS — R293 Abnormal posture: Secondary | ICD-10-CM | POA: Diagnosis not present

## 2024-02-22 DIAGNOSIS — R279 Unspecified lack of coordination: Secondary | ICD-10-CM | POA: Diagnosis not present

## 2024-02-22 DIAGNOSIS — M6281 Muscle weakness (generalized): Secondary | ICD-10-CM | POA: Diagnosis not present

## 2024-02-22 DIAGNOSIS — R293 Abnormal posture: Secondary | ICD-10-CM | POA: Diagnosis not present

## 2024-02-23 DIAGNOSIS — M6281 Muscle weakness (generalized): Secondary | ICD-10-CM | POA: Diagnosis not present

## 2024-02-23 DIAGNOSIS — R279 Unspecified lack of coordination: Secondary | ICD-10-CM | POA: Diagnosis not present

## 2024-02-23 DIAGNOSIS — R293 Abnormal posture: Secondary | ICD-10-CM | POA: Diagnosis not present

## 2024-02-24 DIAGNOSIS — M6281 Muscle weakness (generalized): Secondary | ICD-10-CM | POA: Diagnosis not present

## 2024-02-24 DIAGNOSIS — R279 Unspecified lack of coordination: Secondary | ICD-10-CM | POA: Diagnosis not present

## 2024-02-24 DIAGNOSIS — R293 Abnormal posture: Secondary | ICD-10-CM | POA: Diagnosis not present

## 2024-02-25 DIAGNOSIS — M6281 Muscle weakness (generalized): Secondary | ICD-10-CM | POA: Diagnosis not present

## 2024-02-25 DIAGNOSIS — R293 Abnormal posture: Secondary | ICD-10-CM | POA: Diagnosis not present

## 2024-02-25 DIAGNOSIS — R279 Unspecified lack of coordination: Secondary | ICD-10-CM | POA: Diagnosis not present

## 2024-02-26 DIAGNOSIS — M6281 Muscle weakness (generalized): Secondary | ICD-10-CM | POA: Diagnosis not present

## 2024-02-26 DIAGNOSIS — R293 Abnormal posture: Secondary | ICD-10-CM | POA: Diagnosis not present

## 2024-02-26 DIAGNOSIS — R279 Unspecified lack of coordination: Secondary | ICD-10-CM | POA: Diagnosis not present

## 2024-02-29 DIAGNOSIS — M6281 Muscle weakness (generalized): Secondary | ICD-10-CM | POA: Diagnosis not present

## 2024-02-29 DIAGNOSIS — R279 Unspecified lack of coordination: Secondary | ICD-10-CM | POA: Diagnosis not present

## 2024-02-29 DIAGNOSIS — R293 Abnormal posture: Secondary | ICD-10-CM | POA: Diagnosis not present

## 2024-03-01 DIAGNOSIS — R293 Abnormal posture: Secondary | ICD-10-CM | POA: Diagnosis not present

## 2024-03-01 DIAGNOSIS — R279 Unspecified lack of coordination: Secondary | ICD-10-CM | POA: Diagnosis not present

## 2024-03-01 DIAGNOSIS — M6281 Muscle weakness (generalized): Secondary | ICD-10-CM | POA: Diagnosis not present

## 2024-03-02 DIAGNOSIS — R279 Unspecified lack of coordination: Secondary | ICD-10-CM | POA: Diagnosis not present

## 2024-03-02 DIAGNOSIS — M6281 Muscle weakness (generalized): Secondary | ICD-10-CM | POA: Diagnosis not present

## 2024-03-02 DIAGNOSIS — R293 Abnormal posture: Secondary | ICD-10-CM | POA: Diagnosis not present

## 2024-03-02 DIAGNOSIS — M810 Age-related osteoporosis without current pathological fracture: Secondary | ICD-10-CM | POA: Diagnosis not present

## 2024-03-02 DIAGNOSIS — K5909 Other constipation: Secondary | ICD-10-CM | POA: Diagnosis not present

## 2024-03-02 DIAGNOSIS — E039 Hypothyroidism, unspecified: Secondary | ICD-10-CM | POA: Diagnosis not present

## 2024-03-03 DIAGNOSIS — M6281 Muscle weakness (generalized): Secondary | ICD-10-CM | POA: Diagnosis not present

## 2024-03-03 DIAGNOSIS — R279 Unspecified lack of coordination: Secondary | ICD-10-CM | POA: Diagnosis not present

## 2024-03-03 DIAGNOSIS — R293 Abnormal posture: Secondary | ICD-10-CM | POA: Diagnosis not present

## 2024-03-04 DIAGNOSIS — R293 Abnormal posture: Secondary | ICD-10-CM | POA: Diagnosis not present

## 2024-03-04 DIAGNOSIS — M6281 Muscle weakness (generalized): Secondary | ICD-10-CM | POA: Diagnosis not present

## 2024-03-04 DIAGNOSIS — R279 Unspecified lack of coordination: Secondary | ICD-10-CM | POA: Diagnosis not present

## 2024-03-07 DIAGNOSIS — R279 Unspecified lack of coordination: Secondary | ICD-10-CM | POA: Diagnosis not present

## 2024-03-07 DIAGNOSIS — M6281 Muscle weakness (generalized): Secondary | ICD-10-CM | POA: Diagnosis not present

## 2024-03-07 DIAGNOSIS — R293 Abnormal posture: Secondary | ICD-10-CM | POA: Diagnosis not present

## 2024-03-08 DIAGNOSIS — R279 Unspecified lack of coordination: Secondary | ICD-10-CM | POA: Diagnosis not present

## 2024-03-08 DIAGNOSIS — R293 Abnormal posture: Secondary | ICD-10-CM | POA: Diagnosis not present

## 2024-03-08 DIAGNOSIS — M6281 Muscle weakness (generalized): Secondary | ICD-10-CM | POA: Diagnosis not present

## 2024-03-09 DIAGNOSIS — R293 Abnormal posture: Secondary | ICD-10-CM | POA: Diagnosis not present

## 2024-03-09 DIAGNOSIS — R279 Unspecified lack of coordination: Secondary | ICD-10-CM | POA: Diagnosis not present

## 2024-03-09 DIAGNOSIS — M6281 Muscle weakness (generalized): Secondary | ICD-10-CM | POA: Diagnosis not present

## 2024-03-10 DIAGNOSIS — R279 Unspecified lack of coordination: Secondary | ICD-10-CM | POA: Diagnosis not present

## 2024-03-10 DIAGNOSIS — M6281 Muscle weakness (generalized): Secondary | ICD-10-CM | POA: Diagnosis not present

## 2024-03-10 DIAGNOSIS — R293 Abnormal posture: Secondary | ICD-10-CM | POA: Diagnosis not present

## 2024-03-11 DIAGNOSIS — R279 Unspecified lack of coordination: Secondary | ICD-10-CM | POA: Diagnosis not present

## 2024-03-11 DIAGNOSIS — M6281 Muscle weakness (generalized): Secondary | ICD-10-CM | POA: Diagnosis not present

## 2024-03-11 DIAGNOSIS — R293 Abnormal posture: Secondary | ICD-10-CM | POA: Diagnosis not present

## 2024-03-14 DIAGNOSIS — R293 Abnormal posture: Secondary | ICD-10-CM | POA: Diagnosis not present

## 2024-03-14 DIAGNOSIS — M6281 Muscle weakness (generalized): Secondary | ICD-10-CM | POA: Diagnosis not present

## 2024-03-14 DIAGNOSIS — R279 Unspecified lack of coordination: Secondary | ICD-10-CM | POA: Diagnosis not present

## 2024-03-15 DIAGNOSIS — R293 Abnormal posture: Secondary | ICD-10-CM | POA: Diagnosis not present

## 2024-03-15 DIAGNOSIS — R279 Unspecified lack of coordination: Secondary | ICD-10-CM | POA: Diagnosis not present

## 2024-03-15 DIAGNOSIS — M6281 Muscle weakness (generalized): Secondary | ICD-10-CM | POA: Diagnosis not present

## 2024-03-16 DIAGNOSIS — M6281 Muscle weakness (generalized): Secondary | ICD-10-CM | POA: Diagnosis not present

## 2024-03-16 DIAGNOSIS — R293 Abnormal posture: Secondary | ICD-10-CM | POA: Diagnosis not present

## 2024-03-16 DIAGNOSIS — R279 Unspecified lack of coordination: Secondary | ICD-10-CM | POA: Diagnosis not present

## 2024-03-17 DIAGNOSIS — M6281 Muscle weakness (generalized): Secondary | ICD-10-CM | POA: Diagnosis not present

## 2024-03-17 DIAGNOSIS — R293 Abnormal posture: Secondary | ICD-10-CM | POA: Diagnosis not present

## 2024-03-17 DIAGNOSIS — R279 Unspecified lack of coordination: Secondary | ICD-10-CM | POA: Diagnosis not present

## 2024-03-23 DIAGNOSIS — E782 Mixed hyperlipidemia: Secondary | ICD-10-CM | POA: Diagnosis not present

## 2024-03-23 DIAGNOSIS — I10 Essential (primary) hypertension: Secondary | ICD-10-CM | POA: Diagnosis not present

## 2024-04-06 DIAGNOSIS — I10 Essential (primary) hypertension: Secondary | ICD-10-CM | POA: Diagnosis not present

## 2024-04-06 DIAGNOSIS — E782 Mixed hyperlipidemia: Secondary | ICD-10-CM | POA: Diagnosis not present

## 2024-04-06 DIAGNOSIS — E1169 Type 2 diabetes mellitus with other specified complication: Secondary | ICD-10-CM | POA: Diagnosis not present

## 2024-04-08 DIAGNOSIS — K8000 Calculus of gallbladder with acute cholecystitis without obstruction: Secondary | ICD-10-CM | POA: Diagnosis not present

## 2024-04-08 DIAGNOSIS — R279 Unspecified lack of coordination: Secondary | ICD-10-CM | POA: Diagnosis not present

## 2024-04-14 ENCOUNTER — Other Ambulatory Visit (INDEPENDENT_AMBULATORY_CARE_PROVIDER_SITE_OTHER)

## 2024-04-14 ENCOUNTER — Other Ambulatory Visit: Payer: Self-pay | Admitting: Orthopedic Surgery

## 2024-04-14 ENCOUNTER — Ambulatory Visit (INDEPENDENT_AMBULATORY_CARE_PROVIDER_SITE_OTHER): Admitting: Orthopedic Surgery

## 2024-04-14 DIAGNOSIS — Z96652 Presence of left artificial knee joint: Secondary | ICD-10-CM | POA: Diagnosis not present

## 2024-04-14 DIAGNOSIS — M1712 Unilateral primary osteoarthritis, left knee: Secondary | ICD-10-CM

## 2024-04-14 NOTE — Progress Notes (Signed)
   There were no vitals taken for this visit.  There is no height or weight on file to calculate BMI.  Chief Complaint  Patient presents with   Post-op Follow-up    Left knee replaced 03/18/2017    Encounter Diagnoses  Name Primary?   S/P TKR (total knee replacement), left 03/18/2017 Yes   Unilateral primary osteoarthritis, left knee     DOI/DOS/ Date: 03/18/17  Unchanged

## 2024-04-14 NOTE — Progress Notes (Signed)
 Patient: Glenda Nolan           Date of Birth: 1935/06/10           MRN: [id number] Visit Date: 04/14/2024 Requested by: Omie Bickers, MD [address] [place],  [place] [address] PCP: Omie Bickers, MD   Chief Complaint  Patient presents with   Post-op Follow-up    Left knee replaced 03/18/2017   Encounter Diagnoses  Name Primary?   S/P TKR (total knee replacement), left 03/18/2017 Yes   Unilateral primary osteoarthritis, left knee     Plan:  The patient is 89 years old she is getting very weak.  She does not need any more follow-ups.  7 years out Sigma fixed-bearing posterior stabilized total knee no complications no problems  Chief Complaint  Patient presents with   Post-op Follow-up    Left knee replaced 03/18/2017    89 year old female routine follow-up at 7 years JNJ Sigma fixed-bearing posterior stabilized total knee Sigma implant    There is no height or weight on file to calculate BMI.   Problem list, medical hx, medications and allergies reviewed   Review of Systems  Neurological:  Positive for weakness.     No Known Allergies  There were no vitals taken for this visit.   Physical exam: Physical Exam Musculoskeletal:     Left knee: No effusion.  Neurological:     Mental Status: She is easily aroused.     Left Knee Exam   Tenderness  The patient is experiencing no tenderness.   Range of Motion  Extension:  normal  Left knee flexion: 115.   Tests  Drawer:  Anterior - negative     Posterior - negative  Other  Erythema: absent Scars: present Sensation: normal Pulse: present Effusion: no effusion present  Comments:  Mild edema left ankle      Data reviewed:   Image(s) reviewed with personal interpretation:   DG Knee 1-2 Views Left Result Date: 04/14/2024 2 views left total knee at 7 years Routine annual x-ray follow-up Sigma fixed-bearing posterior stabilized total knee arthroplasty Alignment the tibial tray is in 2 to 3 degrees  of varus this has been noted on original x-ray Extension gap looks equal No signs of loosening Implant sizes look appropriate Impression the implant is stable the alignment is within excepted range no complications are seen     Assessment and plan:  Encounter Diagnoses  Name Primary?   S/P TKR (total knee replacement), left 03/18/2017 Yes   Unilateral primary osteoarthritis, left knee    89 year old female seems to be declining in terms of overall strength.  No need for further imaging or follow-up appointments  No orders of the defined types were placed in this encounter.

## 2024-04-15 ENCOUNTER — Ambulatory Visit: Payer: Medicare Other | Admitting: Orthopedic Surgery

## 2024-04-18 ENCOUNTER — Ambulatory Visit: Payer: Medicare Other | Admitting: Orthopedic Surgery

## 2024-04-19 DIAGNOSIS — R1312 Dysphagia, oropharyngeal phase: Secondary | ICD-10-CM | POA: Diagnosis not present

## 2024-04-22 DIAGNOSIS — R1312 Dysphagia, oropharyngeal phase: Secondary | ICD-10-CM | POA: Diagnosis not present

## 2024-04-25 DIAGNOSIS — R1312 Dysphagia, oropharyngeal phase: Secondary | ICD-10-CM | POA: Diagnosis not present

## 2024-04-27 DIAGNOSIS — R1312 Dysphagia, oropharyngeal phase: Secondary | ICD-10-CM | POA: Diagnosis not present

## 2024-04-29 DIAGNOSIS — R1312 Dysphagia, oropharyngeal phase: Secondary | ICD-10-CM | POA: Diagnosis not present

## 2024-05-02 DIAGNOSIS — R1312 Dysphagia, oropharyngeal phase: Secondary | ICD-10-CM | POA: Diagnosis not present

## 2024-05-04 DIAGNOSIS — R1312 Dysphagia, oropharyngeal phase: Secondary | ICD-10-CM | POA: Diagnosis not present

## 2024-05-06 DIAGNOSIS — R1312 Dysphagia, oropharyngeal phase: Secondary | ICD-10-CM | POA: Diagnosis not present

## 2024-05-16 DIAGNOSIS — R1312 Dysphagia, oropharyngeal phase: Secondary | ICD-10-CM | POA: Diagnosis not present

## 2024-05-17 DIAGNOSIS — R1312 Dysphagia, oropharyngeal phase: Secondary | ICD-10-CM | POA: Diagnosis not present

## 2024-05-20 DIAGNOSIS — R1312 Dysphagia, oropharyngeal phase: Secondary | ICD-10-CM | POA: Diagnosis not present

## 2024-05-23 DIAGNOSIS — R1312 Dysphagia, oropharyngeal phase: Secondary | ICD-10-CM | POA: Diagnosis not present

## 2024-05-24 DIAGNOSIS — R1312 Dysphagia, oropharyngeal phase: Secondary | ICD-10-CM | POA: Diagnosis not present

## 2024-05-26 DIAGNOSIS — R1312 Dysphagia, oropharyngeal phase: Secondary | ICD-10-CM | POA: Diagnosis not present

## 2024-05-27 DIAGNOSIS — R1312 Dysphagia, oropharyngeal phase: Secondary | ICD-10-CM | POA: Diagnosis not present

## 2024-05-28 DIAGNOSIS — M6281 Muscle weakness (generalized): Secondary | ICD-10-CM | POA: Diagnosis not present

## 2024-05-28 DIAGNOSIS — R279 Unspecified lack of coordination: Secondary | ICD-10-CM | POA: Diagnosis not present

## 2024-05-28 DIAGNOSIS — N390 Urinary tract infection, site not specified: Secondary | ICD-10-CM | POA: Diagnosis not present

## 2024-05-28 DIAGNOSIS — K8000 Calculus of gallbladder with acute cholecystitis without obstruction: Secondary | ICD-10-CM | POA: Diagnosis not present

## 2024-05-30 DIAGNOSIS — E119 Type 2 diabetes mellitus without complications: Secondary | ICD-10-CM | POA: Diagnosis not present

## 2024-05-30 DIAGNOSIS — I10 Essential (primary) hypertension: Secondary | ICD-10-CM | POA: Diagnosis not present

## 2024-05-30 DIAGNOSIS — E039 Hypothyroidism, unspecified: Secondary | ICD-10-CM | POA: Diagnosis not present

## 2024-05-30 DIAGNOSIS — K8000 Calculus of gallbladder with acute cholecystitis without obstruction: Secondary | ICD-10-CM | POA: Diagnosis not present

## 2024-06-01 DIAGNOSIS — R1312 Dysphagia, oropharyngeal phase: Secondary | ICD-10-CM | POA: Diagnosis not present

## 2024-06-01 DIAGNOSIS — E1151 Type 2 diabetes mellitus with diabetic peripheral angiopathy without gangrene: Secondary | ICD-10-CM | POA: Diagnosis not present

## 2024-06-01 DIAGNOSIS — L84 Corns and callosities: Secondary | ICD-10-CM | POA: Diagnosis not present

## 2024-06-02 DIAGNOSIS — R1312 Dysphagia, oropharyngeal phase: Secondary | ICD-10-CM | POA: Diagnosis not present

## 2024-06-03 DIAGNOSIS — R1312 Dysphagia, oropharyngeal phase: Secondary | ICD-10-CM | POA: Diagnosis not present

## 2024-06-07 DIAGNOSIS — R1312 Dysphagia, oropharyngeal phase: Secondary | ICD-10-CM | POA: Diagnosis not present

## 2024-06-08 DIAGNOSIS — R1312 Dysphagia, oropharyngeal phase: Secondary | ICD-10-CM | POA: Diagnosis not present

## 2024-06-10 DIAGNOSIS — R1312 Dysphagia, oropharyngeal phase: Secondary | ICD-10-CM | POA: Diagnosis not present

## 2024-06-13 DIAGNOSIS — R1312 Dysphagia, oropharyngeal phase: Secondary | ICD-10-CM | POA: Diagnosis not present

## 2024-06-14 DIAGNOSIS — R1312 Dysphagia, oropharyngeal phase: Secondary | ICD-10-CM | POA: Diagnosis not present

## 2024-06-15 DIAGNOSIS — I10 Essential (primary) hypertension: Secondary | ICD-10-CM | POA: Diagnosis not present

## 2024-06-15 DIAGNOSIS — E119 Type 2 diabetes mellitus without complications: Secondary | ICD-10-CM | POA: Diagnosis not present

## 2024-06-15 DIAGNOSIS — K8000 Calculus of gallbladder with acute cholecystitis without obstruction: Secondary | ICD-10-CM | POA: Diagnosis not present

## 2024-06-15 DIAGNOSIS — E785 Hyperlipidemia, unspecified: Secondary | ICD-10-CM | POA: Diagnosis not present

## 2024-06-15 DIAGNOSIS — R1312 Dysphagia, oropharyngeal phase: Secondary | ICD-10-CM | POA: Diagnosis not present

## 2024-06-16 DIAGNOSIS — E039 Hypothyroidism, unspecified: Secondary | ICD-10-CM | POA: Diagnosis not present

## 2024-06-16 DIAGNOSIS — R1312 Dysphagia, oropharyngeal phase: Secondary | ICD-10-CM | POA: Diagnosis not present

## 2024-06-16 DIAGNOSIS — K8000 Calculus of gallbladder with acute cholecystitis without obstruction: Secondary | ICD-10-CM | POA: Diagnosis not present

## 2024-06-16 DIAGNOSIS — E119 Type 2 diabetes mellitus without complications: Secondary | ICD-10-CM | POA: Diagnosis not present

## 2024-06-16 DIAGNOSIS — I10 Essential (primary) hypertension: Secondary | ICD-10-CM | POA: Diagnosis not present

## 2024-06-21 DIAGNOSIS — E039 Hypothyroidism, unspecified: Secondary | ICD-10-CM | POA: Diagnosis not present

## 2024-06-21 DIAGNOSIS — I10 Essential (primary) hypertension: Secondary | ICD-10-CM | POA: Diagnosis not present

## 2024-06-21 DIAGNOSIS — K8000 Calculus of gallbladder with acute cholecystitis without obstruction: Secondary | ICD-10-CM | POA: Diagnosis not present

## 2024-06-23 DIAGNOSIS — R1312 Dysphagia, oropharyngeal phase: Secondary | ICD-10-CM | POA: Diagnosis not present

## 2024-06-24 DIAGNOSIS — M6281 Muscle weakness (generalized): Secondary | ICD-10-CM | POA: Diagnosis not present

## 2024-06-24 DIAGNOSIS — R1312 Dysphagia, oropharyngeal phase: Secondary | ICD-10-CM | POA: Diagnosis not present

## 2024-06-27 DIAGNOSIS — R1312 Dysphagia, oropharyngeal phase: Secondary | ICD-10-CM | POA: Diagnosis not present

## 2024-06-27 DIAGNOSIS — M6281 Muscle weakness (generalized): Secondary | ICD-10-CM | POA: Diagnosis not present

## 2024-06-27 DIAGNOSIS — E119 Type 2 diabetes mellitus without complications: Secondary | ICD-10-CM | POA: Diagnosis not present

## 2024-06-27 DIAGNOSIS — Z961 Presence of intraocular lens: Secondary | ICD-10-CM | POA: Diagnosis not present

## 2024-06-28 DIAGNOSIS — R1312 Dysphagia, oropharyngeal phase: Secondary | ICD-10-CM | POA: Diagnosis not present

## 2024-06-28 DIAGNOSIS — M6281 Muscle weakness (generalized): Secondary | ICD-10-CM | POA: Diagnosis not present

## 2024-06-30 DIAGNOSIS — R1312 Dysphagia, oropharyngeal phase: Secondary | ICD-10-CM | POA: Diagnosis not present

## 2024-06-30 DIAGNOSIS — M6281 Muscle weakness (generalized): Secondary | ICD-10-CM | POA: Diagnosis not present

## 2024-07-01 DIAGNOSIS — R1312 Dysphagia, oropharyngeal phase: Secondary | ICD-10-CM | POA: Diagnosis not present

## 2024-07-01 DIAGNOSIS — M6281 Muscle weakness (generalized): Secondary | ICD-10-CM | POA: Diagnosis not present

## 2024-07-06 DIAGNOSIS — E039 Hypothyroidism, unspecified: Secondary | ICD-10-CM | POA: Diagnosis not present

## 2024-07-06 DIAGNOSIS — K8000 Calculus of gallbladder with acute cholecystitis without obstruction: Secondary | ICD-10-CM | POA: Diagnosis not present

## 2024-07-06 DIAGNOSIS — E119 Type 2 diabetes mellitus without complications: Secondary | ICD-10-CM | POA: Diagnosis not present

## 2024-07-06 DIAGNOSIS — I10 Essential (primary) hypertension: Secondary | ICD-10-CM | POA: Diagnosis not present

## 2024-07-12 DIAGNOSIS — R1312 Dysphagia, oropharyngeal phase: Secondary | ICD-10-CM | POA: Diagnosis not present

## 2024-07-12 DIAGNOSIS — M6281 Muscle weakness (generalized): Secondary | ICD-10-CM | POA: Diagnosis not present

## 2024-07-13 DIAGNOSIS — R1312 Dysphagia, oropharyngeal phase: Secondary | ICD-10-CM | POA: Diagnosis not present

## 2024-07-13 DIAGNOSIS — M6281 Muscle weakness (generalized): Secondary | ICD-10-CM | POA: Diagnosis not present

## 2024-07-14 DIAGNOSIS — M6281 Muscle weakness (generalized): Secondary | ICD-10-CM | POA: Diagnosis not present

## 2024-07-14 DIAGNOSIS — R1312 Dysphagia, oropharyngeal phase: Secondary | ICD-10-CM | POA: Diagnosis not present

## 2024-07-15 DIAGNOSIS — M6281 Muscle weakness (generalized): Secondary | ICD-10-CM | POA: Diagnosis not present

## 2024-07-15 DIAGNOSIS — R1312 Dysphagia, oropharyngeal phase: Secondary | ICD-10-CM | POA: Diagnosis not present

## 2024-07-18 DIAGNOSIS — M6281 Muscle weakness (generalized): Secondary | ICD-10-CM | POA: Diagnosis not present

## 2024-07-18 DIAGNOSIS — R1312 Dysphagia, oropharyngeal phase: Secondary | ICD-10-CM | POA: Diagnosis not present

## 2024-07-19 DIAGNOSIS — M6281 Muscle weakness (generalized): Secondary | ICD-10-CM | POA: Diagnosis not present

## 2024-07-19 DIAGNOSIS — R1312 Dysphagia, oropharyngeal phase: Secondary | ICD-10-CM | POA: Diagnosis not present

## 2024-07-20 DIAGNOSIS — R1312 Dysphagia, oropharyngeal phase: Secondary | ICD-10-CM | POA: Diagnosis not present

## 2024-07-20 DIAGNOSIS — M6281 Muscle weakness (generalized): Secondary | ICD-10-CM | POA: Diagnosis not present

## 2024-07-21 DIAGNOSIS — M6281 Muscle weakness (generalized): Secondary | ICD-10-CM | POA: Diagnosis not present

## 2024-07-21 DIAGNOSIS — R1312 Dysphagia, oropharyngeal phase: Secondary | ICD-10-CM | POA: Diagnosis not present

## 2024-07-22 DIAGNOSIS — R1312 Dysphagia, oropharyngeal phase: Secondary | ICD-10-CM | POA: Diagnosis not present

## 2024-07-22 DIAGNOSIS — M6281 Muscle weakness (generalized): Secondary | ICD-10-CM | POA: Diagnosis not present

## 2024-07-27 DIAGNOSIS — M6281 Muscle weakness (generalized): Secondary | ICD-10-CM | POA: Diagnosis not present

## 2024-07-28 DIAGNOSIS — M6281 Muscle weakness (generalized): Secondary | ICD-10-CM | POA: Diagnosis not present

## 2024-08-02 DIAGNOSIS — R7989 Other specified abnormal findings of blood chemistry: Secondary | ICD-10-CM | POA: Diagnosis not present

## 2024-08-03 DIAGNOSIS — R1111 Vomiting without nausea: Secondary | ICD-10-CM | POA: Diagnosis not present

## 2024-08-03 DIAGNOSIS — D72829 Elevated white blood cell count, unspecified: Secondary | ICD-10-CM | POA: Diagnosis not present

## 2024-08-03 DIAGNOSIS — R1110 Vomiting, unspecified: Secondary | ICD-10-CM | POA: Diagnosis not present

## 2024-08-03 DIAGNOSIS — E876 Hypokalemia: Secondary | ICD-10-CM | POA: Diagnosis not present

## 2024-08-03 DIAGNOSIS — K8000 Calculus of gallbladder with acute cholecystitis without obstruction: Secondary | ICD-10-CM | POA: Diagnosis not present

## 2024-08-03 DIAGNOSIS — E119 Type 2 diabetes mellitus without complications: Secondary | ICD-10-CM | POA: Diagnosis not present

## 2024-08-03 DIAGNOSIS — R7989 Other specified abnormal findings of blood chemistry: Secondary | ICD-10-CM | POA: Diagnosis not present

## 2024-08-08 DIAGNOSIS — M6281 Muscle weakness (generalized): Secondary | ICD-10-CM | POA: Diagnosis not present

## 2024-08-09 DIAGNOSIS — M6281 Muscle weakness (generalized): Secondary | ICD-10-CM | POA: Diagnosis not present

## 2024-08-10 DIAGNOSIS — M6281 Muscle weakness (generalized): Secondary | ICD-10-CM | POA: Diagnosis not present

## 2024-08-24 DIAGNOSIS — L84 Corns and callosities: Secondary | ICD-10-CM | POA: Diagnosis not present

## 2024-08-24 DIAGNOSIS — E1151 Type 2 diabetes mellitus with diabetic peripheral angiopathy without gangrene: Secondary | ICD-10-CM | POA: Diagnosis not present

## 2024-08-31 DIAGNOSIS — K8000 Calculus of gallbladder with acute cholecystitis without obstruction: Secondary | ICD-10-CM | POA: Diagnosis not present

## 2024-08-31 DIAGNOSIS — I10 Essential (primary) hypertension: Secondary | ICD-10-CM | POA: Diagnosis not present

## 2024-08-31 DIAGNOSIS — M810 Age-related osteoporosis without current pathological fracture: Secondary | ICD-10-CM | POA: Diagnosis not present

## 2024-08-31 DIAGNOSIS — E119 Type 2 diabetes mellitus without complications: Secondary | ICD-10-CM | POA: Diagnosis not present

## 2024-09-02 DIAGNOSIS — M6281 Muscle weakness (generalized): Secondary | ICD-10-CM | POA: Diagnosis not present

## 2024-09-02 DIAGNOSIS — K8000 Calculus of gallbladder with acute cholecystitis without obstruction: Secondary | ICD-10-CM | POA: Diagnosis not present

## 2024-09-02 DIAGNOSIS — L209 Atopic dermatitis, unspecified: Secondary | ICD-10-CM | POA: Diagnosis not present

## 2024-09-02 DIAGNOSIS — J069 Acute upper respiratory infection, unspecified: Secondary | ICD-10-CM | POA: Diagnosis not present
